# Patient Record
Sex: Female | Born: 1985 | Race: White | Hispanic: No | Smoking: Never smoker
Health system: Southern US, Community
[De-identification: ages and names within clinical notes are randomized; demographics above are authoritative.]

## PROBLEM LIST (undated history)

## (undated) DIAGNOSIS — J189 Pneumonia, unspecified organism: Secondary | ICD-10-CM

## (undated) DIAGNOSIS — R600 Localized edema: Secondary | ICD-10-CM

## (undated) DIAGNOSIS — R51 Headache: Secondary | ICD-10-CM

## (undated) DIAGNOSIS — N390 Urinary tract infection, site not specified: Secondary | ICD-10-CM

## (undated) DIAGNOSIS — F329 Major depressive disorder, single episode, unspecified: Secondary | ICD-10-CM

## (undated) DIAGNOSIS — G4730 Sleep apnea, unspecified: Secondary | ICD-10-CM

## (undated) DIAGNOSIS — F419 Anxiety disorder, unspecified: Secondary | ICD-10-CM

## (undated) DIAGNOSIS — F32A Depression, unspecified: Secondary | ICD-10-CM

## (undated) DIAGNOSIS — I639 Cerebral infarction, unspecified: Secondary | ICD-10-CM

## (undated) DIAGNOSIS — G8250 Quadriplegia, unspecified: Secondary | ICD-10-CM

## (undated) HISTORY — PX: ESOPHAGOGASTRODUODENOSCOPY: SHX1529

## (undated) HISTORY — PX: REMOVAL OF GASTROSTOMY TUBE: SHX6058

## (undated) HISTORY — PX: OTHER SURGICAL HISTORY: SHX169

## (undated) HISTORY — PX: BLADDER SURGERY: SHX569

## (undated) HISTORY — PX: SPINE SURGERY: SHX786

## (undated) HISTORY — DX: Localized edema: R60.0

## (undated) HISTORY — DX: Urinary tract infection, site not specified: N39.0

---
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
## 2006-05-09 ENCOUNTER — Encounter: Admission: RE | Admit: 2006-05-09 | Discharge: 2006-05-09 | Payer: Self-pay | Admitting: Family Medicine

## 2006-11-07 ENCOUNTER — Ambulatory Visit (HOSPITAL_COMMUNITY): Admission: RE | Admit: 2006-11-07 | Discharge: 2006-11-07 | Payer: Self-pay | Admitting: Urology

## 2006-11-20 HISTORY — PX: WISDOM TOOTH EXTRACTION: SHX21

## 2007-07-26 ENCOUNTER — Ambulatory Visit (HOSPITAL_COMMUNITY): Admission: RE | Admit: 2007-07-26 | Discharge: 2007-07-26 | Payer: Self-pay | Admitting: Oral Surgery

## 2007-08-13 ENCOUNTER — Ambulatory Visit (HOSPITAL_COMMUNITY): Admission: RE | Admit: 2007-08-13 | Discharge: 2007-08-13 | Payer: Self-pay | Admitting: Unknown Physician Specialty

## 2010-11-20 HISTORY — PX: TENDON REPAIR: SHX5111

## 2011-04-04 NOTE — Op Note (Signed)
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   SURGICAL PROCEDURE:  Surgical removal of 4 impacted wisdom teeth #1,  #16, #17, #32.   ANESTHESIA:  General.   SURGEON:  Hinton Dyer, D.D.S.   ASSISTANTS:  1. Ray Montez Morita.  2. Montel Culver.   ESTIMATED BLOOD LOSS:  30 mL.   CONDITION AT END OF SURGERY:  Good.   Patient is a quadriplegic and was brought to the operating room in a  supine position which remained throughout the whole procedure.  Her  trach was replaced with an endotracheal tube through the trach hole.  Once asleep, an IV was started on the left foot.  She was then draped  and prepped in the usual fashion for an intraoral procedure.  The throat  was suctioned out and a moist vaginal packing was placed around the  throat to block off the posterior opening.  Four Carpules of 2%  Xylocaine with 1:100,000 epinephrine was given as bilateral blocks and  bilateral infiltration to the maxilla and palate.  Four Carpules of 0.5%  Marcaine was then infiltrated a bilateral blocks and infiltration to the  maxilla and palate.  A child size bite block was used.  A 15 blade was  then used to make and incision over the left tuberosity and a periosteal  elevator reflected a full thickness mucoperiosteal flap.  Occlusal  buccal and distal bone was removed with a rongeur and the tooth was now  exposed.  Tooth #16 was then elevated with an 11A elevator and removed  from the  socket.  The socket was curetted and closed with a 3-0 chromic  suture.  A 15 blade made an incision over the left retromolar pad with a  release at the distal buccal aspect of tooth #18.  A full thickness  mucoperiosteal flap was elevated with a periosteal elevator.  Occlusal  buccal and distal bone was removed with a round bur and copious  irrigation.  The tooth was then suctioned longitudinally and each part  was then removed with an 11A elevator.  The socket was curetted,  irrigated and closed with a 3-0 chromic suture.  A 15 blade was used to  make an incision over the right retromolar pad with a release at the  distal buccal aspect of tooth #31.  A full thickness mucoperiosteal flap  was elevated.  Occlusal buccal and distal bone was removed with a round  bur and copious irrigation.  The tooth was then sectioned longitudinally  with the round bur and copious irrigation.  The tooth was split and  removed with an 11A elevator.  Once both parts were removed, the socket  was curetted, irrigated an closed with a 3-0 chromic suture.  A 15 blade  made an incision over the right tuberosity and a full thickness  mucoperiosteal flap was elevated with the periosteal elevator.  Occlusal  buccal and distal bone was removed with a rongeur.  The tooth was  visualized and elevated out of the socket with an 11A elevator.  The  socket was curetted and closed with a 3-0 chromic suture.  The throat  pack was removed and the throat was suctioned out.  The surgical areas  were packed off.  The bite block was removed and the patient's  endotracheal tube was removed and replaced with her normal trach.  She  tolerated the procedure well and was returned to the recovery room in  good condition.  She will be sent home later this morning and she was  given an RX for Lortab elixir times 300 mL, 1 or 2 teaspoons q.4 h.  p.r.n. pain.           ______________________________  Hinton Dyer, D.D.S.      JLM/MEDQ  D:  08/13/2007  T:  08/13/2007  Job:  [id number]

## 2011-04-07 NOTE — H&P (Signed)
Veronica Huff, Veronica Huff              ACCOUNT NO.:  [id number]   MEDICAL RECORD NO.:  [id number]          PATIENT TYPE:  INP   LOCATION:  0155                         FACILITY:  [facility]   PHYSICIAN:  Ronald L. Earlene Plater, M.D.  DATE OF BIRTH:  09-24-1986   DATE OF ADMISSION:  04/21/2005  DATE OF DISCHARGE:                                HISTORY & PHYSICAL   CHIEF COMPLAINT:  Chronic neurogenic bladder.   HISTORY OF PRESENT ILLNESS:  Veronica Huff is a 25 year old female with past  medical history significant for quadriplegia and resultant neurogenic  bladder that was noted on evaluation to produce high pressures secondary to  decreased compliance. Veronica Huff manages her bladder with intermittent  catheterization and her kidneys were noted to be within normal limits on  renal ultrasound. 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 Due  to the chronic nature for her condition several options have been discussed  with the family for management of her bladder including suprapubic tube,  Foley catheters, ileal conduit, urinary diversion, and augmentation  cystoplasty. After a long discussion with family concerning these options,  they expressed their interest in augmentation cystoplasty with a Mitrofanoff  catheterizable stoma. Due to the fact that there would still be some urine  leakage, the decision was also made to perform a bladder neck closure at the  time of the cystoplasty. All risks, benefits, and alternatives were  discussed with the family in detail and they have voided their  understanding.   REVIEW OF SYSTEMS:  Negative except as above.   PAST MEDICAL HISTORY:  Significant for:  1.  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  2.  Quadriplegia.  3.  Ventilator dependence.  4.  History of aspiration pneumonia.  5.  History of pericardial effusion.  6.  History of hemorrhagic gastritis.   PAST SURGICAL HISTORY:  1.  Status post placement of tracheostomy tube.  2.  Status post placement of  G-tube.   MEDICATIONS:  Baclofen, nortriptyline, Ditropan, multivitamin.   ALLERGIES:  1.  TOBRAMYCIN.  2.  CIPRO.  3.  ERYTHROMYCIN.  4.  ZYRTEC.  5.  TRAZODONE.   SOCIAL HISTORY:  The patient denies smoking or drinking.   FAMILY HISTORY:  Negative for GU malignancy.   PHYSICAL EXAMINATION:  VITAL SIGNS:  The patient is afebrile and  hemodynamically stable.  GENERAL:  The patient is well-nourished white female on a respirator via a  tracheostomy. She is in no acute distress.  HEENT:  Pupils equal, round, reactive to light. Extraocular movements  intact.  NECK:  No masses.  HEART:  Regular rate and rhythm without murmurs.  LUNGS:  Clear to auscultation bilaterally.  ABDOMEN:  Soft, nontender, nondistended. Bowel sounds positive.  EXTREMITIES:  No clubbing, cyanosis or edema, although there is significant  atrophy of musculature on the lower extremities and the upper extremities.  SKIN:  No lesions.  NEUROLOGIC:  The patient is quadriplegic.   ASSESSMENT AND PLAN:  A 25 year old female quadriplegic with neurogenic  bladder. We plan to admit Veronica Huff to [facility] and take  her to the operating room at  which time she will undergo augmentation  cystoplasty with creation of a Mitrofanoff catheterizable stoma. At the same  time, we will plan to do bladder neck closure. Following surgery, she will  transfer to the post anesthesia care unit, and subsequently to the intensive  care unit where further management decisions will be made based on her  condition. We will also plan to contact Dr. Fannie Knee, her pulmonologist,  at the time of admission for assistance in her management following surgery.       JP/MEDQ  D:  04/21/2005  T:  04/21/2005  Job:  [id number]

## 2011-04-07 NOTE — Assessment & Plan Note (Signed)
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  We botoxed her bilateral gastrocs and soleus at last visit with 200  units in each leg.  The patient had good results but has noticed over the  last few weeks some spasms returning and affecting her mobility in her  wheelchair and often when her legs kick into spasms, her knees hit the lap  board and affect the ability to maneuver.  She has seen ENT specialist in  [place], [place], who has placed her on Passey-Muir valve and  actually did some laser surgery on her trachea and has cleared an opening  above the stoma and the patient is currently using a Passey-Muir valve and  is doing some phonation.  She is going to begin in speech therapy shortly.  Sylvester has questions about some recurrent seborrhea.  It does not seem to be  getting better on a routine basis.  Also, there are questions about moving  to a suprapubic catheter to help with independence and avoid intermittent  catheterizations.  She is getting catheterized currently twice a day and her  first catheterization is running 200 or 300 mL and then second  catheterization will range from 400 to 1000.  She does have some  incontinence as well.  She saw Dr. Fannie Knee for breathing issues and she  is trying some nighttime weaning on her own and spent most of the other  night off of the vent, however, the following night she needed to return to  the vent on pressure support to help her get through.   The patient denies any new problems with pain.  Upper extremity spasticity  is generally controlled.  She denies any chest pain or new shortness of  breath or wheezing.  No seizures, weakness or numbness.  No blurred vision.  Mood has been fairly good.  No nausea, vomiting, diarrhea, or constipation.  Denies any new urinary symptoms.   PHYSICAL EXAMINATION:  GENERAL APPEARANCE:  The patient is pleasant and in  no acute distress.  VITAL  SIGNS:  Blood pressure is stable and she is afebrile.  NEUROLOGIC:  The patient is unchanged from the standpoint of her  neurological examination with some trace shoulder shrug and trace to 2/4  tone in the upper extremities.  Upper extremity tightness is mostly due to  contracture.  Lower extremity tone is quite a problem still and she has  multiple areas of sustained clonus noted.  The sensory examination is  unchanged.  The patient is speaking with better articulation and higher  volume whisper with her Passey-Muir valve in place.  She is much more  intelligible than she has been in the past.  Mood is appropriate.  Cognitively she is intact.   ASSESSMENT:  1. 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  2. 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     She is trying to wean herself off the vent slowly at nighttime per     pulmonary recommendations.  3. Urinary retention and incontinence.  4. Seborrheic dermatitis.   PLAN:  1. Instead of returning to Botox injections, I would like to start a     systemic oral agent.  Will begin her on a trial of Zanaflex 2 mg q.h.s.     titrating up after two weeks to 2 mg t.i.d.  Will check a complete     metabolic panel today.  We discussed the potential side effects today.  2. The patient will follow up with ENT regarding her trach and continue with     her speech therapy.  3. Wrote a prescription today for replacement gurney pad and evaluation for     potential new wheelchair since she has had the current one approximately     five years.  4. Will make a referral to Dr. Darvin Neighbours for discussion of suprapubic     catheter and to follow up on any type of urodynamic testing she may need     at this point.  5. I made a dermatology referral as well today.  6. Will see the patient back in approximately four to five weeks' time.      Ranelle Oyster, M.D.   ZTS/MedQ  D:  12/23/2003 10:34:04  T:   12/23/2003 12:14:37  Job #:  [id number]   cc:   Windy Fast L. Ovidio Hanger, M.D.  [address]  [place]  [place] [address]  Fax: [phone]

## 2011-04-07 NOTE — Op Note (Signed)
NAMEEARLY, ORD              ACCOUNT NO.:  [id number]   MEDICAL RECORD NO.:  [id number]          PATIENT TYPE:  INP   LOCATION:  0155                         [facility]ACILITY:  [facility]   PHYSICIAN:  Renato Battles, M.D.     DATE OF BIRTH:  [birth year]   DATE OF PROCEDURE:  04/22/2005  DATE OF DISCHARGE:                                 OPERATIVE REPORT   PRIMARY CARE PHYSICIAN:  Talmadge Coventry, M.D.   REQUESTING PHYSICIAN:  Lucrezia Starch. Earlene Plater, M.D.   PRIMARY PULMONOLOGIST:  Rennis Chris. Maple Hudson, M.D.   REASON FOR CONSULTATION:  General medical management.   HISTORY OF PRESENT ILLNESS:  The patient is a very pleasant, 25 year old  female who had suffered ischemic brainstem stroke back at the age of 67  secondary to unknown reasons.  Since then, the patient has been dealing with  spastic quadriplegia and multiple other medical problems related to her  quadriplegia including neurogenic bladder and chronic respiratory failure.  The patient recently was evaluated for bladder augmentation surgery to  reduce her frequency of in-and-out catheterization, and she was judged to be  a good candidate for the surgery by Dr. Earlene Plater and Dr. Maple Hudson.  She had her  surgery today, and at this time, she is in the unit postoperatively.  The  patient is alert, reports no complaints.  She is interactive but without  verbalizations.  Her parents are at the bedside.  Most of the history was  obtained from the parents.   REVIEW OF SYSTEMS:  Unobtainable.   PAST MEDICAL HISTORY:  1.  Ischemic stroke.  2.  Neurogenic bladder.  3.  History of recurrent UTIs.  4.  History of aspiration pneumonia.  5.  Scoliosis, status post corrective surgery.  6.  Chronic respiratory insufficiency, dependent on pressure support      ventilation at night and tracheostomy collar in the daytime.  7.  History of hemorrhagic gastritis.   PAST SURGICAL HISTORY:  1.  Botox injections in lower extremities for spastic contractures.  2.   Spinal fusion for scoliosis.  3.  Tracheostomy with laser tracheoplasty secondary to tracheal narrowing.   SOCIAL HISTORY:  She does not smoke, drink, or use drugs.  She eats solid  food at home.  She sits in a wheelchair all day during the daytime and uses  a tracheostomy collar at night.  She is on pressure support ventilation  using room air.  She has been able to graduate from high school.   FAMILY HISTORY:  Positive for coronary artery disease.   ALLERGIES:  CIPROFLOXACIN, ERYTHROMYCIN, TOBRAMYCIN, TRAZODONE, AND ZYRTEC.   HOME MEDICATIONS:  1.  Baclofen.  2.  Daily multivitamins.  3.  Colace.  4.  Ditropan XL.   CURRENT MEDICATIONS IN THE HOSPITAL:  1.  Baclofen 20 mg p.o. q.i.d.  2.  Dulcolax suppository.  3.  Dilaudid PCA.  4.  Imipramine 75 mg p.o. q.h.s.  5.  Zosyn 3.375 g IV q.6 h.  6.  Zoloft 100 mg p.o. daily.  7.  Benadryl, Reglan, Narcan, Zofran all p.r.n.   PHYSICAL EXAMINATION:  GENERAL:  The patient is alert, in no acute distress.  VITAL SIGNS:  Temperature 97.7, heart rate in the 130s, respiratory rate 22,  blood pressure at the time of exam 75/35.  HEENT:  The head is atraumatic and normocephalic.  Pupils are equal, round,  and reactive to light and accommodation.  Extraocular movements are intact  bilaterally.  NECK:  No lymphadenopathy, no thyromegaly, no JVD.  CHEST:  Clear to auscultation bilaterally, no wheezing, rales, or rhonchi.  HEART:  Regular rhythm tachycardia, no murmurs.  ABDOMEN:  Soft, nontender, nondistended, absent bowel sounds.  EXTREMITIES:  No cyanosis, edema, or clubbing.  The patient has  hyperextension of all extremities.   STUDIES:  Preoperative CBC showed white count 11.9, hemoglobin 11, platelets  134,000.  Liver functions showed low protein at 4, low albumin at 2.2,  normal functions otherwise.  Electrolytes were all within normal except for  elevated glucose of 130.   ASSESSMENT AND PLAN:  1.  Respiratory:  Dr. Jetty Duhamel is managing her ventilation, and I will      leave the management of the respiratory system to him as he is more      familiar with the patient.  2.  Cardiovascular:  Going to continue SCDs for DVT prophylaxis.  At this      time, the patient is showing hypotension and tachycardia suggestive of      hypovolemia.  I am going to bolus her with IV fluids and normal saline      and monitor her blood pressure and heart rate closely.  Also, I am going      to obtain a CBC to check for any decline in hemoglobin as a sign of      bleeding.  3.  Infectious Disease:  No signs of infection at this time.  Leukocytosis      can be attributed to surgical distress.  However, Zosyn shall be      continued, and I am going to obtain blood culture at this time.  4.  Gastrointestinal:  I am going to give the patient Protonix p.o. for GI      prophylaxis.  Otherwise, the patient should be n.p.o. except for ice      chips until we have bowel sounds or bowel movements.  5.  Renal:  At this time, the patient has a normal BUN and creatinine, good      urine input, no signs of trouble with this system  6.  General:  The patient will be supported out of bed to a bedside chair in      the morning.  Also, physical therapy      will be obtained for range of motion.  Also, we are going to obtain      medical records from Dr. Stephannie Peters office in the morning.   I appreciate the chance of taking care of this patient, and thank you.      SA/MEDQ  D:  04/21/2005  T:  04/22/2005  Job:  [id number]   cc:   Talmadge Coventry, M.D.  [address]  [place]  [place] [address]  Fax: [phone]   Lucrezia Starch. Earlene Plater, M.D.  [address]  [place]  [place] [address]  Fax: [phone]   Clinton D. Maple Hudson, M.D.

## 2011-04-07 NOTE — Op Note (Signed)
NAMECHANNELLE, Veronica Huff              ACCOUNT NO.:  [id number]   MEDICAL RECORD NO.:  [id number]          PATIENT TYPE:  INP   LOCATION:  0002                         FACILITY:  [facility]   PHYSICIAN:  Lucrezia Starch. Earlene Plater, M.D.  DATE OF BIRTH:  [birth year]   DATE OF PROCEDURE:  04/21/2005  DATE OF DISCHARGE:                                 OPERATIVE REPORT   >  Neurogenic bladder, status post brainstem stroke.   OPERATIVE PROCEDURE:  1.  Exploratory laparotomy with dissection of bladder neck, enclosure of      bladder neck.  2.  Goodwin cup augmentation cystoplasty utilizing ileum.  3.  Ileoileostomy.  4.  Mitrofanoff procedure utilizing appendix.  5.  Placement of cystostomy tube.   SURGEON:  Gaynelle Arabian, M.D.   ASSISTANTS:  Willow Ora, MD  Bailey Mech, MD  Cammy Copa, [facility]   INDICATIONS FOR PROCEDURE:  Veronica Huff is a lovely 25 year old white  female, history of brainstem stroke with neurogenic bladder.  She has been  incontinent.  They are having to catheterize her quite often.  On  urodynamics, she has a small capacity bladder.  It was elected to proceed  with the above procedure to increase her reservoir and to make a  catheterizable stoma.  After understanding risks. benefits, and  alternatives, they elected to proceed.   PROCEDURE IN DETAIL:  The patient was placed in supine position.  After  proper general anesthesia utilizing her tracheostomy tube, she was placed in  the frogleg position and prepped and draped with Betadine in sterile  fashion.  A 16 French full silicone catheter was placed into the bladder,  and the bladder was drained and irrigated with antibiotic solution.  A lower  midline vertical abdominal incision was made.  Sharp dissection was carried  down through the subcutaneous tissue.  The peritoneum was entered and  exploratory laparotomy was performed.  The uterus, ovaries, etc., appeared  to be in excellent condition, and no significant  abnormalities were noted.  There was some distention of her transverse and right  colon.  The appendix  was noted to be present.  Dissection was carried in the space of Retzius.  The bladder neck was dissected free from the vaginal mucosa, avoiding the  vaginal mucosa, and I cut at the pelvic diaphragm.  The stump of urethra was  closed with a running 3-0 Vicryl suture, and good hemostasis was noted to be  present.  The bladder neck was closed in two layers utilizing 2-0 Vicryl  sutures, and the procedure was carried above.  An approximately 25 cm  segment of ileum was obtained, maintaining the ileocecal valve intact to  utilize for the Goodwin cup and maintain its vascular pedicle.  The  mesentery was taken down with a LigaSure.  The augmented segment was dropped  inferiorly, and an ileoileostomy was performed utilizing a side-to-side  anastomosis with TIA staples and carried by a TA55.  Wide anastomosis was  noted to be present.  The mesenteric window was closed with interrupted pop-  off 3-0 silk sutures.  The pouch was then sewn vertically  and horizontally  to create a pouch utilizing 3-0 PDS sutures, and the bladder was clam-  shelled in the vertical position and drained.  The Goodwin cup was then  attached to the bladder with running 2-0 PDS sutures.  Before it was  completely closed, a 24 [place] cystostomy tube was placed through a separate  stab incision on the left side of the bladder, inflated with 15 mL, and the  balloon noted to be in good position.  The appendix was then taken down from  the cecum utilizing a TIA device.  It was taken down utilizing a stump of  cecum, and the cecum was oversewn with a running 3-0 silk suture.  The end  of the appendix was resected.  The appendix was irrigated, maintained on its  vascular pedicle, and the distal end was anastomosed into the bladder with 4-  0 PDS sutures and noted to be in good position.  An 70 French full silicone  Foley  catheter was passed and noted to be within the bladder, again with 10  mL inflated in the balloon.  The remaining augmentation cystoplasty was  attached to the bladder.  A stoma was created in the right lower quadrant,  and the proximal appendiceal stump was pulled through and sewn to the skin  with interrupted 4-0 Vicryl suture.  The bladder was then inflated with the  cystostomy tube.  There was no significant leakage noted, and it was noted  to irrigate clear.  The appendiceal stump appeared to be well-vascularized,  and we were comfortable with the stoma.  A large flat Blake drain was placed  through a left stab incision and sutured in place with silk sutures; silk  was placed also to suture in the cystostomy tube.  The catheterizable stoma  tube which was 30 [place] was again full silicone, was capped and placed  within a bag.  Thorough irrigation was performed.  Good hemostasis was noted  to be present.  Omentum was placed down to the pelvis.  The fascia was  closed with running #1 PDS suture with interspaced interrupted figure-of-  eights.  Thorough irrigation was performed in the subcutaneous tissue.  The  skin was closed with skin staples, and the patient was taken to the recovery  room stable.       RLD/MEDQ  D:  04/21/2005  T:  04/21/2005  Job:  [id number]

## 2011-04-07 NOTE — Discharge Summary (Signed)
NAMEELLAMARIE, NAEVE              ACCOUNT NO.:  [id number]   MEDICAL RECORD NO.:  [id number]          PATIENT TYPE:  INP   LOCATION:  0155                         FACILITY:  [facility]   PHYSICIAN:  Ronald L. Earlene Plater, M.D.  DATE OF BIRTH:  July 30, 1986   DATE OF ADMISSION:  04/21/2005  DATE OF DISCHARGE:  04/28/2005                                 DISCHARGE SUMMARY   DIAGNOSES:  Neurogenic bladder.   OPERATIVE PROCEDURE:  Exploratory laparotomy with Goodwin cup augmentation  cystoplasty, appendiceal Mitrofanoff procedure, and closure of bladder neck  on April 21, 2005.   HISTORY AND PHYSICAL EXAMINATION:  Ms. 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  She is managed with intermittent catheterization.  Kidneys  were normal.  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  She  understands risks, benefits, and alternatives on that.   Past medical history, social history, family history, review of systems:  Please see history and physical examination for full detail.   PHYSICAL EXAMINATION:  VITAL SIGNS:  She is afebrile.  Vital signs are  stable.  GENERAL:  Well-nourished, well-developed female.  Has a prior tracheostomy.  Has a tracheostomy in place and has quadriplegia.  HEENT:  Normal.  NECK:  No masses, thyromegaly.  CHEST:  Clear anterior, posterior without rales or rhonchi.  HEART:  Normal sinus rhythm without rubs or gallops.  ABDOMEN:  Soft, nontender.  Bowel sounds are positive.  EXTREMITIES:  She had significant atrophy of the musculature of the lower  and upper extremities.  SKIN:  No lesions.  NEUROLOGIC:  As above.   HOSPITAL COURSE:  Patient was admitted.  After undergoing proper  preoperative evaluation was subsequently taken to surgery and  underwent the  above procedure.  Immediately postoperative her hemoglobin was 11,  hematocrit 31.8.  BMET was essentially normal.  Blake output was low.  She  was seen by Dr. Fannie Knee, her pulmonologist, in consultation and you can  see his note for full details.  He helped manage her breathing status.  We  also asked her to be seen by the hospitalists who helped manage the patient  throughout the course.  By June 3 which was postoperative day one she was  afebrile.  Hemoglobin was 9.4.  She was somewhat hypovolemic and was  rehydrated.  She was utilizing her ventilator at night, but not during the  day.  She also had some acute delirium from her narcotics and these were  tapered.  By postoperative day two, April 23, 2005, again she was stable  hemodynamically.  Blood pressure was slightly low.  Hemoglobin was 8.4 and  white blood cell count was 10.7.  Her abdomen was soft.  All wounds were  clean.  Drainage was quite low from her Harrison Mons drain but again, she was  suffering some from acute delirium which was managed appropriately.  By April 24, 2005 she was much  more alert.  She was noted to have some hypotension  that was currently stable.  She had hypokalemia which was managed with runs  of potassium.  Continued to do well with that.  She was somewhat anemic, but  again, hemoglobin was stable at 9.7 and all wounds appeared to be healing  well.  Mobilization was begun at that point.  Physical therapy was also  asked to see the patient and she underwent respiratory care.  She was noted  to be on a Jenn-Air bed and skin was protected.  By April 25, 2005 the  hypotension was resolved.  Hypokalemia was still being managed.  Potassium  was 3.2.  She was being given runs of potassium.  She would develop diarrhea  at that point.  It was felt that the respiratory status was stable.  Thrombocytopenia was resolved.  Hallucinations and acute delirium was  resolved.  Mobilization was continued to be  increase and her pulmonary  toilet was continued to be maintained.  By April 26, 2005 diarrhea was much  better.  The Zosyn was discontinued and her situation continued to improve  along with her pulmonary management.  We continued to irrigate her bladder.  Blake drain had minimal drainage.  By April 27, 2005 she was doing well.  Vital signs were stable.  Catheter teaching was begun.  The [place] drain was  draining very little, therefore, it was removed.  By June 9 situation  continued to improve.  She was subsequently discharged.   DISCHARGE INSTRUCTIONS:  Home care given.  Bladder management.   CONDITION ON DISCHARGE:  Improved.  She was to follow up the following week  to see Dr. Retta Diones.  Wounds were clear.  She will come for staple removal.  Discharge medications were marked in the chart.       RLD/MEDQ  D:  05/10/2005  T:  05/10/2005  Job:  [id number]

## 2011-08-31 LAB — CBC
HCT: 36.6
MCV: 73.5 — ABNORMAL LOW

## 2011-09-01 LAB — BASIC METABOLIC PANEL
BUN: 8
GFR calc Af Amer: >60
GFR calc non Af Amer: >60
Potassium: 3.9
Sodium: 138

## 2011-09-01 LAB — URINALYSIS, ROUTINE W REFLEX MICROSCOPIC
Glucose, UA: NEGATIVE
Ketones, ur: NEGATIVE
Nitrite: NEGATIVE

## 2011-09-01 LAB — CBC
HCT: 35.2 — ABNORMAL LOW
Hemoglobin: 11.5 — ABNORMAL LOW
MCHC: 32.7
RBC: 4.72
RDW: 16.5 — ABNORMAL HIGH
WBC: 6

## 2011-09-01 LAB — URINE MICROSCOPIC-ADD ON

## 2012-04-09 ENCOUNTER — Encounter (HOSPITAL_COMMUNITY): Payer: Self-pay | Admitting: Emergency Medicine

## 2012-04-09 ENCOUNTER — Emergency Department (HOSPITAL_COMMUNITY)
Admission: EM | Admit: 2012-04-09 | Discharge: 2012-04-10 | Disposition: A | Discharge status: Home / Self Care | Payer: Medicaid Other | Attending: Emergency Medicine | Admitting: Emergency Medicine

## 2012-04-09 DIAGNOSIS — M549 Dorsalgia, unspecified: Secondary | ICD-10-CM | POA: Insufficient documentation

## 2012-04-09 DIAGNOSIS — G4730 Sleep apnea, unspecified: Secondary | ICD-10-CM | POA: Insufficient documentation

## 2012-04-09 DIAGNOSIS — Z8679 Personal history of other diseases of the circulatory system: Secondary | ICD-10-CM | POA: Insufficient documentation

## 2012-04-09 DIAGNOSIS — Z87440 Personal history of urinary (tract) infections: Secondary | ICD-10-CM | POA: Insufficient documentation

## 2012-04-09 DIAGNOSIS — M542 Cervicalgia: Secondary | ICD-10-CM | POA: Insufficient documentation

## 2012-04-09 DIAGNOSIS — R300 Dysuria: Secondary | ICD-10-CM | POA: Insufficient documentation

## 2012-04-09 DIAGNOSIS — G8250 Quadriplegia, unspecified: Secondary | ICD-10-CM | POA: Insufficient documentation

## 2012-04-09 DIAGNOSIS — R3915 Urgency of urination: Secondary | ICD-10-CM | POA: Insufficient documentation

## 2012-04-09 DIAGNOSIS — N390 Urinary tract infection, site not specified: Secondary | ICD-10-CM

## 2012-04-09 HISTORY — DX: Quadriplegia, unspecified: G82.50

## 2012-04-09 HISTORY — DX: Cerebral infarction, unspecified: I63.9

## 2012-04-09 HISTORY — DX: Sleep apnea, unspecified: G47.30

## 2012-04-09 LAB — URINALYSIS, ROUTINE W REFLEX MICROSCOPIC
Glucose, UA: NEGATIVE mg/dL
Ketones, ur: NEGATIVE mg/dL
Protein, ur: 30 mg/dL — AB
Urobilinogen, UA: 0.2 mg/dL (ref 0.0–1.0)

## 2012-04-09 LAB — URINE MICROSCOPIC-ADD ON

## 2012-04-09 MED ORDER — SODIUM CHLORIDE 0.9 % IV SOLN
INTRAVENOUS | Status: DC
  Administered 2012-04-09: via INTRAVENOUS

## 2012-04-09 MED ORDER — HYDROMORPHONE HCL PF 1 MG/ML IJ SOLN
1.0000 mg | Freq: Once | INTRAMUSCULAR | Status: AC
  Administered 2012-04-09: 1 mg via INTRAVENOUS
  Filled 2012-04-09: qty 1

## 2012-04-09 MED ORDER — ONDANSETRON HCL 4 MG/2ML IJ SOLN
4.0000 mg | Freq: Once | INTRAMUSCULAR | Status: AC
  Administered 2012-04-09: 4 mg via INTRAVENOUS
  Filled 2012-04-09: qty 2

## 2012-04-09 NOTE — ED Notes (Signed)
Pt states that she has had groin pain x 2 days. Feels like UTI. States she has many. Pt is quadraplegic since 2000 bc of CVA.

## 2012-04-09 NOTE — ED Notes (Signed)
ZOX:[id number]<AV> Expected date:04/09/12<BR> Expected time: 8:21 PM<BR> Means of arrival:Ambulance<BR> Comments:<BR> Pt36. 25 f. Wheelchair bound, quad pt. UTI symptoms, pain. 15 mins

## 2012-04-10 MED ORDER — OXYCODONE-ACETAMINOPHEN 5-325 MG PO TABS: 1.0000 | ORAL_TABLET | ORAL | Status: AC | PRN

## 2012-04-10 MED ORDER — HYDROMORPHONE HCL PF 1 MG/ML IJ SOLN
1.0000 mg | Freq: Once | INTRAMUSCULAR | Status: AC
  Administered 2012-04-10: 1 mg via INTRAVENOUS
  Filled 2012-04-10: qty 1

## 2012-04-10 MED ORDER — CEPHALEXIN 500 MG PO CAPS: 500.0000 mg | ORAL_CAPSULE | Freq: Four times a day (QID) | ORAL | Status: AC

## 2012-04-10 MED ORDER — DEXTROSE 5 % IV SOLN
1.0000 g | Freq: Once | INTRAVENOUS | Status: AC
  Administered 2012-04-10: 1 g via INTRAVENOUS
  Filled 2012-04-10: qty 10

## 2012-04-10 NOTE — Discharge Instructions (Signed)
Drink plenty of fluids. Start the antibiotic prescription tomorrow.   Urinary Tract Infection Infections of the urinary tract can start in several places. A bladder infection (cystitis), a kidney infection (pyelonephritis), and a prostate infection (prostatitis) are different types of urinary tract infections (UTIs). They usually get better if treated with medicines (antibiotics) that kill germs. Take all the medicine until it is gone. You or your child may feel better in a few days, but TAKE ALL MEDICINE or the infection may not respond and may become more difficult to treat. HOME CARE INSTRUCTIONS   Drink enough water and fluids to keep the urine clear or pale yellow. Cranberry juice is especially recommended, in addition to large amounts of water.   Avoid caffeine, tea, and carbonated beverages. They tend to irritate the bladder.   Alcohol may irritate the prostate.   Only take over-the-counter or prescription medicines for pain, discomfort, or fever as directed by your caregiver.  To prevent further infections:  Empty the bladder often. Avoid holding urine for long periods of time.   After a bowel movement, women should cleanse from front to back. Use each tissue only once.   Empty the bladder before and after sexual intercourse.  FINDING OUT THE RESULTS OF YOUR TEST Not all test results are available during your visit. If your or your child's test results are not back during the visit, make an appointment with your caregiver to find out the results. Do not assume everything is normal if you have not heard from your caregiver or the medical facility. It is important for you to follow up on all test results. SEEK MEDICAL CARE IF:   There is back pain.   Your baby is older than 3 months with a rectal temperature of 100.5 F (38.1 C) or higher for more than 1 day.   Your or your child's problems (symptoms) are no better in 3 days. Return sooner if you or your child is getting worse.    SEEK IMMEDIATE MEDICAL CARE IF:   There is severe back pain or lower abdominal pain.   You or your child develops chills.   You have a fever.   Your baby is older than 3 months with a rectal temperature of 102 F (38.9 C) or higher.   Your baby is 57 months old or younger with a rectal temperature of 100.4 F (38 C) or higher.   There is nausea or vomiting.   There is continued burning or discomfort with urination.  MAKE SURE YOU:   Understand these instructions.   Will watch your condition.   Will get help right away if you are not doing well or get worse.  Document Released: 08/16/2005 Document Revised: 10/26/2011 Document Reviewed: 03/21/2007 [facility] Patient Information 2012 [place], [place].

## 2012-04-10 NOTE — ED Provider Notes (Signed)
History     CSN: [id number]  Arrival date & time 04/09/12  2030   First MD Initiated Contact with Patient 04/09/12 2234      Chief Complaint  Patient presents with  . Urinary Tract Infection    (Consider location/radiation/quality/duration/timing/severity/associated sxs/prior treatment) HPI Comments: Veronica Huff is a 26 y.o. Female who has had urgency, and pain. When she has urinary self-catheterization at home for 2 days. She denies fever, chills, nausea or vomiting. She last had urinary tract infection 3 months ago. She has generalized back pain and neck pain, that she feels is related to lying on the gurney. She has been using her regular medication, without relief.  Patient is a 26 y.o. female presenting with urinary tract infection. The history is provided by the patient.  Urinary Tract Infection    Past Medical History  Diagnosis Date  . CVA (cerebral infarction)   . Quadriplegia   . Sleep apnea   . UTI (urinary tract infection)     History reviewed. No pertinent past surgical history.  No family history on file.  History  Substance Use Topics  . Smoking status: Not on file  . Smokeless tobacco: Not on file  . Alcohol Use:     OB History    Grav Para Term Preterm Abortions TAB SAB Ect Mult Living                  Review of Systems  All other systems reviewed and are negative.    Allergies  Ciprofloxacin; Erythromycin; Tobramycin; and Trazodone and nefazodone  Home Medications   Current Outpatient Rx  Name Route Sig Dispense Refill  . BACLOFEN 20 MG PO TABS Oral Take 40 mg by mouth 4 (four) times daily.    Marland Kitchen BISACODYL 10 MG RE SUPP Rectal Place 10 mg rectally every other day as needed. constipation    . CETIRIZINE HCL 10 MG PO TABS Oral Take 10 mg by mouth daily.    . CHLORHEXIDINE GLUCONATE 0.12 % MT SOLN Mouth/Throat Use as directed 15-30 mLs in the mouth or throat 2 (two) times daily. Rinse and spit    . DANTROLENE SODIUM 50 MG PO CAPS Oral  Take 50-100 mg by mouth 3 (three) times daily. Takes 2 capsules(100mg ) every morning, 1 capsule(50mg ) midday and takes 2 capsules(100mg ) every night    . FLUOCINOLONE ACETONIDE 0.01 % EX SHAM Apply externally Apply 1 application topically daily.    Marland Kitchen HYDROCORTISONE 1 % EX LOTN Topical Apply 1 application topically 2 (two) times daily.    . IMIPRAMINE HCL 25 MG PO TABS Oral Take 75-100 mg by mouth at bedtime.    Marland Kitchen MUPIROCIN 2 % EX OINT Tracheal Tube 1 application by Tracheal Tube route daily. Applies to trach-stoma site    . NEOMYCIN-POLYMYXIN B GU IR SOLN GU irrigant 30-40 mLs by GU irrigant route at bedtime.    Marland Kitchen NITROFURANTOIN MACROCRYSTAL 50 MG PO CAPS Oral Take 50 mg by mouth at bedtime.    . NYSTATIN 100000 UNIT/GM EX POWD Topical Apply 1 g topically 2 (two) times daily as needed. redness    . OLOPATADINE HCL 0.1 % OP SOLN Both Eyes Place 1 drop into both eyes 2 (two) times daily.    Marland Kitchen OMEPRAZOLE 40 MG PO CPDR Oral Take 40 mg by mouth daily.    Marland Kitchen POLYETHYLENE GLYCOL 3350 PO PACK Oral Take 17 g by mouth daily.    Marland Kitchen POLYETHYLENE GLYCOL 400 0.25 % OP GEL Ophthalmic  Apply 1-2 drops to eye 2 (two) times daily as needed. Excessive dryness    . SERTRALINE HCL 100 MG PO TABS Oral Take 100 mg by mouth at bedtime.    . SODIUM CHLORIDE 0.9 % IR SOLN GU irrigant 50 mLs by GU irrigant route every 6 (six) hours as needed. Sediment/ lack of patency    . SODIUM FLUORIDE 1.1 % DT CREA dental Place 1 application onto teeth 2 (two) times daily.    Marland Kitchen SOLIFENACIN SUCCINATE 10 MG PO TABS Oral Take 5 mg by mouth daily.    . CEPHALEXIN 500 MG PO CAPS Oral Take 1 capsule (500 mg total) by mouth 4 (four) times daily. 28 capsule 0  . OXYCODONE-ACETAMINOPHEN 5-325 MG PO TABS Oral Take 1 tablet by mouth every 4 (four) hours as needed for pain. 20 tablet 0    BP 104/54  Pulse 99  Temp(Src) 97.7 F (36.5 C) (Oral)  Resp 16  SpO2 100%  Physical Exam  Nursing note and vitals reviewed. Constitutional: She is  oriented to person, place, and time. She appears well-developed and well-nourished.  HENT:  Head: Normocephalic and atraumatic.  Eyes: Conjunctivae and EOM are normal. Pupils are equal, round, and reactive to light.  Neck: Phonation normal. Neck supple.        Patent tracheostomy, site appears, normal.  Cardiovascular: Normal rate and regular rhythm.   Pulmonary/Chest: Effort normal and breath sounds normal. She exhibits no tenderness.  Abdominal: Soft. She exhibits no distension. There is no tenderness. There is no guarding.  Musculoskeletal: She exhibits no edema.  Neurological: She is alert and oriented to person, place, and time. She has normal strength. No cranial nerve deficit.       Quadriparetic with spasticity of arms, and leg  Skin: Skin is warm and dry.  Psychiatric: She has a normal mood and affect. Her behavior is normal. Judgment and thought content normal.    ED Course  Procedures (including critical care time)  Emergency department treatment: IV fluid , IV, Dilaudid, and Zofran, IV, Rocephin   Labs Reviewed  URINALYSIS, ROUTINE W REFLEX MICROSCOPIC - Abnormal; Notable for the following:    Hgb urine dipstick SMALL (*)    Protein, ur 30 (*)    Leukocytes, UA MODERATE (*)    All other components within normal limits  URINE MICROSCOPIC-ADD ON  URINE CULTURE   No results found.   1. UTI (lower urinary tract infection)       MDM  Uncomplicated urinary tract infection. Doubt sepsis, metabolic instability or impending vascular collapse.    Plan: Home Medications- Keflex, Percocet; Home Treatments- increase fluids; Recommended follow up- PCP in 7-10 days for repeat urine        Flint Melter, MD 04/11/12 1535

## 2012-04-14 LAB — URINE CULTURE
Colony Count: >=100000
Culture  Setup Time: 201305220453

## 2012-04-15 NOTE — ED Notes (Signed)
+   urine Patient treated with keflex-sensitive to same-chart appended per protocol MD. 

## 2012-08-19 ENCOUNTER — Other Ambulatory Visit: Payer: Self-pay | Admitting: Urology

## 2012-08-19 DIAGNOSIS — N390 Urinary tract infection, site not specified: Secondary | ICD-10-CM

## 2012-08-19 DIAGNOSIS — N319 Neuromuscular dysfunction of bladder, unspecified: Secondary | ICD-10-CM

## 2012-08-23 ENCOUNTER — Ambulatory Visit
Admission: RE | Admit: 2012-08-23 | Discharge: 2012-08-23 | Disposition: A | Discharge status: Home / Self Care | Payer: Medicaid Other | Source: Ambulatory Visit | Attending: Urology | Admitting: Urology

## 2012-08-23 ENCOUNTER — Other Ambulatory Visit: Payer: Self-pay | Admitting: Urology

## 2012-08-23 DIAGNOSIS — N390 Urinary tract infection, site not specified: Secondary | ICD-10-CM

## 2012-08-23 DIAGNOSIS — R109 Unspecified abdominal pain: Secondary | ICD-10-CM

## 2012-08-23 DIAGNOSIS — N319 Neuromuscular dysfunction of bladder, unspecified: Secondary | ICD-10-CM

## 2012-09-27 ENCOUNTER — Other Ambulatory Visit (HOSPITAL_COMMUNITY): Payer: Self-pay | Admitting: Gastroenterology

## 2012-09-27 DIAGNOSIS — R110 Nausea: Secondary | ICD-10-CM

## 2012-10-11 ENCOUNTER — Encounter (HOSPITAL_COMMUNITY)
Admission: RE | Admit: 2012-10-11 | Discharge: 2012-10-11 | Disposition: A | Discharge status: Home / Self Care | Payer: Medicaid Other | Source: Ambulatory Visit | Attending: Gastroenterology | Admitting: Gastroenterology

## 2012-10-11 ENCOUNTER — Other Ambulatory Visit (HOSPITAL_COMMUNITY): Payer: Self-pay | Admitting: Gastroenterology

## 2012-10-11 DIAGNOSIS — R110 Nausea: Secondary | ICD-10-CM

## 2012-10-11 MED ORDER — TECHNETIUM TC 99M MEBROFENIN IV KIT
5.0000 | PACK | Freq: Once | INTRAVENOUS | Status: AC | PRN
  Administered 2012-10-11: 5 via INTRAVENOUS

## 2012-10-11 MED ORDER — SINCALIDE 5 MCG IJ SOLR
INTRAMUSCULAR | Status: AC
  Filled 2012-10-11: qty 5

## 2012-10-23 ENCOUNTER — Encounter (INDEPENDENT_AMBULATORY_CARE_PROVIDER_SITE_OTHER): Payer: Self-pay | Admitting: General Surgery

## 2012-10-25 ENCOUNTER — Ambulatory Visit (INDEPENDENT_AMBULATORY_CARE_PROVIDER_SITE_OTHER): Payer: Medicaid Other | Admitting: General Surgery

## 2012-10-25 ENCOUNTER — Encounter (INDEPENDENT_AMBULATORY_CARE_PROVIDER_SITE_OTHER): Payer: Self-pay | Admitting: General Surgery

## 2012-10-25 DIAGNOSIS — K828 Other specified diseases of gallbladder: Secondary | ICD-10-CM

## 2012-10-25 NOTE — Progress Notes (Signed)
Patient ID: Veronica Huff, female   DOB: 1986-06-21, 26 y.o.   MRN: [id number]  Chief Complaint  Patient presents with  . Abdominal Pain    HPI Veronica Huff is a 26 y.o. female.  Is referred by Dr. Madilyn Fireman for evaluation for possible biliary dyskinesia versus colic. Patient is a 26 year old female wheelchair-bound after stroke in 2000.  Patient's had bouts of nausea without any emesis over the last 2 years. Patient's undergone ultrasound revealed multiple gallstones. Patient has a HIDA scan which reveals no filling of the gallbladder with a normal ejection fraction. Patient has been worked up by GI with upper endoscopy suite are negative at this time. She's been placed on PPIs without relief. HPI  Past Medical History  Diagnosis Date  . CVA (cerebral infarction)   . Quadriplegia   . Sleep apnea   . UTI (urinary tract infection)     Past Surgical History  Procedure Date  . Ileostomy 2006  . Esophagogastroduodenoscopy   . Spine surgery 2004,2011  . Trachiotomy 2000  . Gastrostomy tube placement 2000  . Bronchosocpy 2006  . Throat surgery 2004  . Wisdom tooth extraction 2008  . Bladder surgery 2009,2012  . Tendon repair left wrist 2012    History reviewed. No pertinent family history.  Social History History  Substance Use Topics  . Smoking status: Never Smoker   . Smokeless tobacco: Not on file  . Alcohol Use: No    Allergies  Allergen Reactions  . Ciprofloxacin   . Erythromycin   . Morphine And Related Other (See Comments)    Goes crazy  . Tobramycin   . Trazodone And Nefazodone     Current Outpatient Prescriptions  Medication Sig Dispense Refill  . baclofen (LIORESAL) 20 MG tablet Take 40 mg by mouth 4 (four) times daily.      . bisacodyl (DULCOLAX) 10 MG suppository Place 10 mg rectally every other day as needed. constipation      . cetirizine (ZYRTEC) 10 MG tablet Take 10 mg by mouth daily.      . chlorhexidine (PERIDEX) 0.12 % solution Use as directed  15-30 mLs in the mouth or throat 2 (two) times daily. Rinse and spit      . dantrolene (DANTRIUM) 50 MG capsule Take 50-100 mg by mouth 3 (three) times daily. Takes 2 capsules(100mg ) every morning, 1 capsule(50mg ) midday and takes 2 capsules(100mg ) every night      . Fluocinolone Acetonide (CAPEX) 0.01 % SHAM Apply 1 application topically daily.      . hydrocortisone 1 % lotion Apply 1 application topically 2 (two) times daily.      Marland Kitchen imipramine (TOFRANIL) 25 MG tablet Take 75-100 mg by mouth at bedtime.      . mupirocin ointment (BACTROBAN) 2 % 1 application by Tracheal Tube route daily. Applies to trach-stoma site      . neomycin-polymyxin B (NEOSPORIN GU IRRIGANT) SOLN 30-40 mLs by GU irrigant route at bedtime.      . nitrofurantoin (MACRODANTIN) 50 MG capsule Take 50 mg by mouth at bedtime.      Marland Kitchen nystatin (MYCOSTATIN/NYSTOP) 100000 UNIT/GM POWD Apply 1 g topically 2 (two) times daily as needed. redness      . olopatadine (PATANOL) 0.1 % ophthalmic solution Place 1 drop into both eyes 2 (two) times daily.      Marland Kitchen omeprazole (PRILOSEC) 40 MG capsule Take 40 mg by mouth daily.      . polyethylene glycol (MIRALAX / GLYCOLAX) packet  Take 17 g by mouth daily.      . Polyethylene Glycol 400 (BLINK TEARS) 0.25 % GEL Apply 1-2 drops to eye 2 (two) times daily as needed. Excessive dryness      . sertraline (ZOLOFT) 100 MG tablet Take 100 mg by mouth at bedtime.      . sodium chloride irrigation 0.9 % irrigation 50 mLs by GU irrigant route every 6 (six) hours as needed. Sediment/ lack of patency      . sodium fluoride (PREVIDENT 5000 PLUS) 1.1 % CREA dental cream Place 1 application onto teeth 2 (two) times daily.      . solifenacin (VESICARE) 10 MG tablet Take 5 mg by mouth daily.        Review of Systems Review of Systems  Constitutional: Negative.   HENT: Negative.   Respiratory: Negative.   Cardiovascular: Negative.   Gastrointestinal: Negative.   Musculoskeletal: Negative.     Last  menstrual period 09/18/2012.  Physical Exam Physical Exam  Constitutional: She is oriented to person, place, and time. She appears well-developed and well-nourished.  HENT:  Head: Normocephalic and atraumatic.  Eyes: Conjunctivae normal and EOM are normal. Pupils are equal, round, and reactive to light.  Neck: Neck supple.  Cardiovascular: Normal rate and normal heart sounds.   Pulmonary/Chest: Effort normal and breath sounds normal.  Abdominal: Soft. There is no tenderness. There is no rebound.  Musculoskeletal: Normal range of motion.  Neurological: She is alert and oriented to person, place, and time.    Data Reviewed reviewed  Assessment    26 year old female with possible biliary colic.    Plan    1. Will proceed to the operating room for laparoscopic cholecystectomy with no cholangiogram. 2. All risks and benefits were discussed with the patient, to generally include infection, bleeding, damage to surrounding structures, and recurrence. Alternatives were offered and described.  All questions were answered and the patient voiced understanding of the procedure and wishes to proceed at this point.        Marigene Ehlers., Natayah Warmack 10/25/2012, 3:21 PM

## 2012-11-07 ENCOUNTER — Encounter (HOSPITAL_COMMUNITY): Payer: Self-pay | Admitting: *Deleted

## 2012-11-07 ENCOUNTER — Other Ambulatory Visit (HOSPITAL_COMMUNITY): Payer: Self-pay | Admitting: *Deleted

## 2012-11-07 NOTE — Progress Notes (Signed)
SAMEDAY PREOP INSTRUCTIONS DISCUSSED WITH PT BY PHONE AND HER BOYFRIEND WROTE DOWN HER INSTRUCTIONS FOR HER.  CHLORHEXIDINE SHOWER INSTRUCTIONS / PRECAUTIONS DISCUSSED WITH PT.

## 2012-11-12 ENCOUNTER — Encounter (HOSPITAL_COMMUNITY): Payer: Self-pay | Admitting: Pharmacy Technician

## 2012-11-18 ENCOUNTER — Ambulatory Visit (HOSPITAL_COMMUNITY): Payer: Medicaid Other | Admitting: Anesthesiology

## 2012-11-18 ENCOUNTER — Encounter (HOSPITAL_COMMUNITY): Payer: Self-pay | Admitting: *Deleted

## 2012-11-18 ENCOUNTER — Encounter (HOSPITAL_COMMUNITY): Payer: Self-pay | Admitting: Anesthesiology

## 2012-11-18 ENCOUNTER — Ambulatory Visit (HOSPITAL_COMMUNITY)
Admission: RE | Admit: 2012-11-18 | Discharge: 2012-11-18 | Disposition: A | Discharge status: Home / Self Care | Payer: Medicaid Other | Source: Ambulatory Visit | Attending: General Surgery | Admitting: General Surgery

## 2012-11-18 ENCOUNTER — Encounter (HOSPITAL_COMMUNITY)
Admission: RE | Disposition: A | Discharge status: Home / Self Care | Payer: Self-pay | Source: Ambulatory Visit | Attending: General Surgery

## 2012-11-18 DIAGNOSIS — K8010 Calculus of gallbladder with chronic cholecystitis without obstruction: Secondary | ICD-10-CM | POA: Insufficient documentation

## 2012-11-18 DIAGNOSIS — G4730 Sleep apnea, unspecified: Secondary | ICD-10-CM | POA: Insufficient documentation

## 2012-11-18 DIAGNOSIS — Z8673 Personal history of transient ischemic attack (TIA), and cerebral infarction without residual deficits: Secondary | ICD-10-CM | POA: Insufficient documentation

## 2012-11-18 HISTORY — DX: Depression, unspecified: F32.A

## 2012-11-18 HISTORY — DX: Cerebral infarction, unspecified: I63.9

## 2012-11-18 HISTORY — PX: CHOLECYSTECTOMY: SHX55

## 2012-11-18 HISTORY — DX: Headache: R51

## 2012-11-18 HISTORY — DX: Major depressive disorder, single episode, unspecified: F32.9

## 2012-11-18 LAB — BASIC METABOLIC PANEL
CO2: 29 mEq/L (ref 19–32)
Chloride: 97 mEq/L (ref 96–112)
Sodium: 134 mEq/L — ABNORMAL LOW (ref 135–145)

## 2012-11-18 LAB — CBC
HCT: 40.1 % (ref 36.0–46.0)
Hemoglobin: 13.8 g/dL (ref 12.0–15.0)
MCV: 83.7 fL (ref 78.0–100.0)
RBC: 4.79 MIL/uL (ref 3.87–5.11)
WBC: 4.6 10*3/uL (ref 4.0–10.5)

## 2012-11-18 LAB — HCG, SERUM, QUALITATIVE: Preg, Serum: NEGATIVE

## 2012-11-18 LAB — SURGICAL PCR SCREEN: Staphylococcus aureus: NEGATIVE

## 2012-11-18 SURGERY — LAPAROSCOPIC CHOLECYSTECTOMY
Anesthesia: General | Wound class: Contaminated

## 2012-11-18 MED ORDER — ONDANSETRON HCL 4 MG/2ML IJ SOLN: 4.0000 mg | Freq: Four times a day (QID) | INTRAMUSCULAR | Status: DC | PRN

## 2012-11-18 MED ORDER — NEOSTIGMINE METHYLSULFATE 1 MG/ML IJ SOLN
INTRAMUSCULAR | Status: DC | PRN
  Administered 2012-11-18: 3 mg via INTRAVENOUS

## 2012-11-18 MED ORDER — FENTANYL CITRATE 0.05 MG/ML IJ SOLN
INTRAMUSCULAR | Status: DC | PRN
  Administered 2012-11-18 (×5): 50 ug via INTRAVENOUS

## 2012-11-18 MED ORDER — KETOROLAC TROMETHAMINE 15 MG/ML IJ SOLN
INTRAMUSCULAR | Status: AC
  Filled 2012-11-18: qty 2

## 2012-11-18 MED ORDER — PROMETHAZINE HCL 25 MG/ML IJ SOLN: 6.2500 mg | INTRAMUSCULAR | Status: DC | PRN

## 2012-11-18 MED ORDER — MUPIROCIN 2 % EX OINT
TOPICAL_OINTMENT | Freq: Once | CUTANEOUS | Status: DC
  Filled 2012-11-18: qty 22

## 2012-11-18 MED ORDER — ACETAMINOPHEN 10 MG/ML IV SOLN
INTRAVENOUS | Status: AC
  Filled 2012-11-18: qty 100

## 2012-11-18 MED ORDER — ACETAMINOPHEN 10 MG/ML IV SOLN
INTRAVENOUS | Status: DC | PRN
  Administered 2012-11-18: 1000 mg via INTRAVENOUS

## 2012-11-18 MED ORDER — CEFAZOLIN SODIUM-DEXTROSE 2-3 GM-% IV SOLR
INTRAVENOUS | Status: AC
  Filled 2012-11-18: qty 50

## 2012-11-18 MED ORDER — CHLORHEXIDINE GLUCONATE 4 % EX LIQD: 1.0000 "application " | Freq: Once | CUTANEOUS | Status: DC

## 2012-11-18 MED ORDER — FENTANYL CITRATE 0.05 MG/ML IJ SOLN
25.0000 ug | INTRAMUSCULAR | Status: DC | PRN
  Administered 2012-11-18 (×4): 25 ug via INTRAVENOUS

## 2012-11-18 MED ORDER — OXYCODONE HCL 5 MG PO TABS
5.0000 mg | ORAL_TABLET | ORAL | Status: DC | PRN
  Administered 2012-11-18: 5 mg via ORAL
  Filled 2012-11-18: qty 1

## 2012-11-18 MED ORDER — BUPIVACAINE-EPINEPHRINE PF 0.25-1:200000 % IJ SOLN
INTRAMUSCULAR | Status: AC
  Filled 2012-11-18: qty 30

## 2012-11-18 MED ORDER — BUPIVACAINE-EPINEPHRINE (PF) 0.5% -1:200000 IJ SOLN
INTRAMUSCULAR | Status: AC
  Filled 2012-11-18: qty 10

## 2012-11-18 MED ORDER — KETOROLAC TROMETHAMINE 30 MG/ML IJ SOLN
15.0000 mg | Freq: Once | INTRAMUSCULAR | Status: AC | PRN
  Administered 2012-11-18: 30 mg via INTRAVENOUS

## 2012-11-18 MED ORDER — LACTATED RINGERS IV SOLN
INTRAVENOUS | Status: DC
  Administered 2012-11-18: 1000 mL via INTRAVENOUS
  Administered 2012-11-18: 11:00:00 via INTRAVENOUS

## 2012-11-18 MED ORDER — BUPIVACAINE-EPINEPHRINE PF 0.25-1:200000 % IJ SOLN
INTRAMUSCULAR | Status: DC | PRN
  Administered 2012-11-18: 15 mL

## 2012-11-18 MED ORDER — SODIUM CHLORIDE 0.9 % IJ SOLN: 3.0000 mL | INTRAMUSCULAR | Status: DC | PRN

## 2012-11-18 MED ORDER — OXYCODONE-ACETAMINOPHEN 5-325 MG PO TABS: 1.0000 | ORAL_TABLET | ORAL | Status: DC | PRN

## 2012-11-18 MED ORDER — CEFAZOLIN SODIUM-DEXTROSE 2-3 GM-% IV SOLR
2.0000 g | INTRAVENOUS | Status: AC
  Administered 2012-11-18: 2 g via INTRAVENOUS

## 2012-11-18 MED ORDER — SODIUM CHLORIDE 0.9 % IJ SOLN: 3.0000 mL | Freq: Two times a day (BID) | INTRAMUSCULAR | Status: DC

## 2012-11-18 MED ORDER — GLYCOPYRROLATE 0.2 MG/ML IJ SOLN
INTRAMUSCULAR | Status: DC | PRN
  Administered 2012-11-18: 0.4 mg via INTRAVENOUS

## 2012-11-18 MED ORDER — ROCURONIUM BROMIDE 100 MG/10ML IV SOLN
INTRAVENOUS | Status: DC | PRN
  Administered 2012-11-18: 30 mg via INTRAVENOUS

## 2012-11-18 MED ORDER — PROPOFOL 10 MG/ML IV BOLUS
INTRAVENOUS | Status: DC | PRN
  Administered 2012-11-18: 70 mg via INTRAVENOUS

## 2012-11-18 MED ORDER — ACETAMINOPHEN 325 MG PO TABS: 650.0000 mg | ORAL_TABLET | ORAL | Status: DC | PRN

## 2012-11-18 MED ORDER — LIDOCAINE HCL (CARDIAC) 20 MG/ML IV SOLN
INTRAVENOUS | Status: DC | PRN
  Administered 2012-11-18: 50 mg via INTRAVENOUS

## 2012-11-18 MED ORDER — ONDANSETRON HCL 4 MG/2ML IJ SOLN
INTRAMUSCULAR | Status: DC | PRN
  Administered 2012-11-18: 4 mg via INTRAVENOUS

## 2012-11-18 MED ORDER — FENTANYL CITRATE 0.05 MG/ML IJ SOLN
INTRAMUSCULAR | Status: AC
  Filled 2012-11-18: qty 2

## 2012-11-18 MED ORDER — MIDAZOLAM HCL 5 MG/5ML IJ SOLN
INTRAMUSCULAR | Status: DC | PRN
  Administered 2012-11-18: 2 mg via INTRAVENOUS

## 2012-11-18 MED ORDER — SODIUM CHLORIDE 0.9 % IV SOLN: 250.0000 mL | INTRAVENOUS | Status: DC | PRN

## 2012-11-18 MED ORDER — LACTATED RINGERS IR SOLN
Status: DC | PRN
  Administered 2012-11-18: 1000 mL

## 2012-11-18 MED ORDER — IOHEXOL 300 MG/ML  SOLN
INTRAMUSCULAR | Status: AC
  Filled 2012-11-18: qty 1

## 2012-11-18 MED ORDER — HEMOSTATIC AGENTS (NO CHARGE) OPTIME
TOPICAL | Status: DC | PRN
  Administered 2012-11-18: 1 via TOPICAL

## 2012-11-18 MED ORDER — ACETAMINOPHEN 650 MG RE SUPP
650.0000 mg | RECTAL | Status: DC | PRN
  Filled 2012-11-18: qty 1

## 2012-11-18 SURGICAL SUPPLY — 45 items
APL SKNCLS STERI-STRIP NONHPOA (GAUZE/BANDAGES/DRESSINGS) ×1
APPLIER CLIP 5 13 M/L LIGAMAX5 (MISCELLANEOUS) ×2
APR CLP MED LRG 5 ANG JAW (MISCELLANEOUS) ×1
BAG SPEC RTRVL LRG 6X4 10 (ENDOMECHANICALS)
BENZOIN TINCTURE PRP APPL 2/3 (GAUZE/BANDAGES/DRESSINGS) ×2 IMPLANT
CABLE HIGH FREQUENCY MONO STRZ (ELECTRODE) ×2 IMPLANT
CANISTER SUCTION 2500CC (MISCELLANEOUS) ×2 IMPLANT
CHLORAPREP W/TINT 26ML (MISCELLANEOUS) ×2 IMPLANT
CLIP APPLIE 5 13 M/L LIGAMAX5 (MISCELLANEOUS) ×1 IMPLANT
CLOTH BEACON ORANGE TIMEOUT ST (SAFETY) ×2 IMPLANT
COVER MAYO STAND STRL (DRAPES) IMPLANT
DECANTER SPIKE VIAL GLASS SM (MISCELLANEOUS) ×2 IMPLANT
DEVICE TROCAR PUNCTURE CLOSURE (ENDOMECHANICALS) ×2 IMPLANT
DRAPE C-ARM 42X72 X-RAY (DRAPES) IMPLANT
DRAPE LAPAROSCOPIC ABDOMINAL (DRAPES) ×2 IMPLANT
DRSG TEGADERM 2-3/8X2-3/4 SM (GAUZE/BANDAGES/DRESSINGS) ×1 IMPLANT
DRSG TEGADERM 4X4.75 (GAUZE/BANDAGES/DRESSINGS) ×1 IMPLANT
ELECT REM PT RETURN 9FT ADLT (ELECTROSURGICAL) ×2
ELECTRODE REM PT RTRN 9FT ADLT (ELECTROSURGICAL) ×1 IMPLANT
GAUZE SPONGE 2X2 8PLY STRL LF (GAUZE/BANDAGES/DRESSINGS) ×1 IMPLANT
GLOVE BIO SURGEON STRL SZ7.5 (GLOVE) ×2 IMPLANT
GLOVE BIOGEL PI IND STRL 7.0 (GLOVE) ×2 IMPLANT
GLOVE BIOGEL PI INDICATOR 7.0 (GLOVE) ×2
GLOVE ECLIPSE 6.5 STRL STRAW (GLOVE) ×2 IMPLANT
GOWN STRL NON-REIN LRG LVL3 (GOWN DISPOSABLE) ×2 IMPLANT
GOWN STRL REIN XL XLG (GOWN DISPOSABLE) ×4 IMPLANT
HEMOSTAT SNOW SURGICEL 2X4 (HEMOSTASIS) ×1 IMPLANT
HEMOSTAT SURGICEL 4X8 (HEMOSTASIS) IMPLANT
KIT BASIN OR (CUSTOM PROCEDURE TRAY) ×2 IMPLANT
NEEDLE INSUFFLATION 14GA 120MM (NEEDLE) ×2 IMPLANT
NS IRRIG 1000ML POUR BTL (IV SOLUTION) IMPLANT
POUCH SPECIMEN RETRIEVAL 10MM (ENDOMECHANICALS) IMPLANT
SCISSORS LAP 5X35 DISP (ENDOMECHANICALS) ×2 IMPLANT
SET CHOLANGIOGRAPH MIX (MISCELLANEOUS) IMPLANT
SET IRRIG TUBING LAPAROSCOPIC (IRRIGATION / IRRIGATOR) ×2 IMPLANT
SOLUTION ANTI FOG 6CC (MISCELLANEOUS) ×2 IMPLANT
SPONGE GAUZE 2X2 STER 10/PKG (GAUZE/BANDAGES/DRESSINGS) ×1
SPONGE GAUZE 4X4 12PLY (GAUZE/BANDAGES/DRESSINGS) ×2 IMPLANT
STRIP CLOSURE SKIN 1/2X4 (GAUZE/BANDAGES/DRESSINGS) ×2 IMPLANT
SUT MNCRL AB 4-0 PS2 18 (SUTURE) ×2 IMPLANT
TOWEL OR 17X26 10 PK STRL BLUE (TOWEL DISPOSABLE) ×2 IMPLANT
TRAY LAP CHOLE (CUSTOM PROCEDURE TRAY) ×2 IMPLANT
TROCAR BLADELESS OPT 5 75 (ENDOMECHANICALS) ×6 IMPLANT
TROCAR XCEL NON-BLD 11X100MML (ENDOMECHANICALS) ×2 IMPLANT
TUBING INSUFFLATION 10FT LAP (TUBING) ×2 IMPLANT

## 2012-11-18 NOTE — Transfer of Care (Signed)
Immediate Anesthesia Transfer of Care Note  Patient: Veronica Huff  Procedure(s) Performed: Procedure(s) (LRB) with comments: LAPAROSCOPIC CHOLECYSTECTOMY (N/A)  Patient Location: PACU  Anesthesia Type:General  Level of Consciousness: awake and alert   Airway & Oxygen Therapy: Patient Spontanous Breathing  Post-op Assessment: Report given to PACU RN and Post -op Vital signs reviewed and stable  Post vital signs: Reviewed and stable  Complications: No apparent anesthesia complications

## 2012-11-18 NOTE — Anesthesia Postprocedure Evaluation (Signed)
  Anesthesia Post-op Note  Patient: Careers information officer  Procedure(s) Performed: Procedure(s) (LRB): LAPAROSCOPIC CHOLECYSTECTOMY (N/A)  Patient Location: PACU  Anesthesia Type: General  Level of Consciousness: awake and alert   Airway and Oxygen Therapy: Patient Spontanous Breathing  Post-op Pain: mild  Post-op Assessment: Post-op Vital signs reviewed, Patient's Cardiovascular Status Stable, Respiratory Function Stable, Patent Airway and No signs of Nausea or vomiting  Last Vitals:  Filed Vitals:   11/18/12 1215  BP: 103/41  Pulse: 89  Temp: 36.4 C  Resp: 14    Post-op Vital Signs: stable   Complications: No apparent anesthesia complications

## 2012-11-18 NOTE — Anesthesia Preprocedure Evaluation (Signed)
Anesthesia Evaluation  Patient identified by MRN, date of birth, ID band Patient awake    Reviewed: Allergy & Precautions, H&P , NPO status , Patient's Chart, lab work & pertinent test results  Airway Mallampati: II TM Distance: <3 FB Neck ROM: Limited    Dental No notable dental hx.    Pulmonary sleep apnea ,  breath sounds clear to auscultation  + decreased breath sounds      Cardiovascular negative cardio ROS  Rhythm:Regular Rate:Normal     Neuro/Psych Quadriplegic  CVA, Residual Symptoms negative psych ROS   GI/Hepatic negative GI ROS, Neg liver ROS,   Endo/Other  negative endocrine ROS  Renal/GU negative Renal ROS  negative genitourinary   Musculoskeletal negative musculoskeletal ROS (+)   Abdominal   Peds negative pediatric ROS (+)  Hematology negative hematology ROS (+)   Anesthesia Other Findings   Reproductive/Obstetrics negative OB ROS                           Anesthesia Physical Anesthesia Plan  ASA: III  Anesthesia Plan: General   Post-op Pain Management:    Induction: Intravenous  Airway Management Planned: Tracheostomy  Additional Equipment:   Intra-op Plan:   Post-operative Plan:   Informed Consent: I have reviewed the patients History and Physical, chart, labs and discussed the procedure including the risks, benefits and alternatives for the proposed anesthesia with the patient or authorized representative who has indicated his/her understanding and acceptance.     Plan Discussed with: CRNA and Surgeon  Anesthesia Plan Comments:         Anesthesia Quick Evaluation

## 2012-11-18 NOTE — Op Note (Signed)
Pre Operative Diagnosis: biliary colic   Post Operative Diagnosis: same  Surgeon: Dr. Axel Filler   Procedure: laparoscopic cholecystectomy  Assistant: none  Anesthesia: Gen. Endotracheal anesthesia   EBL: 20 cc  Complications:  Counts: reported as correct x 2   Findings: The patient had most of the gallbladder. Patient is a former liver bed was hemostatic case. A piece of fibrillar hemostatic agent was placed in the liver bed.  Indications for procedure: patient is a 25 year old female with right upper quadrant pain. Patient nausea vomiting with pain and decided to have this electively removed.  Details of the procedure:  The patient was taken to the operating and placed in the supine position with bilateral SCDs in place. A time out was called and all facts were verified. A pneumoperitoneum was obtained via A Veress needle technique to a pressure of 14mm of mercury. A 5mm trochar was then placed in the right upper quadrant under visualization, and there were no injuries to any abdominal organs. A 11 mm port was then placed in the umbilical region after infiltrating with local anesthesia under direct visualization. A second and third epigastric port and right lower quadrant port placement under direct visualization, respectively. The gallbladder was identified and retracted, the peritoneum was then sharply dissected from the gallbladder and this dissection was carried down to Calot's triangle. The gallbladder was identified and stripped away circumferentially and seen going into the gallbladder 360.   2 clips were placed proximally one distally and the cystic duct transected. The cystic artery was identified and 2 clips placed proximally and one distally and transected.  We then proceeded to remove the gallbladder off the hepatic fossa with Bovie cautery. A Latex retrieval bag was then placed in the abdomen and gallbladder placed in the bag. There was some oozing coming from the hepatic  fossa. This was Bovie cautery again hemostasis. A piece fibrillar hemostatic agent was placed in the hepatic bed. The hepatic fossa was then reexamined and hemostasis was achieved with Bovie cautery and was excellent at the end of the case. The subhepatic fossa and perihepatic fossa was then irrigated until the effluent was clear. The 11 mm trocar fascia was reapproximated with the Endo Close #1 Vicryl.  The pneumoperitoneum was evacuated and all trochars removed under direct visulalization.  The skin was then closed with 4-0 Monocryl and the skin dressed with Steri-Strips, gauze, and tape.  The patient was awaken from general anesthesia and taken to the recovery room in stable condition.

## 2012-11-18 NOTE — H&P (View-Only) (Signed)
Patient ID: Veronica Huff, female   DOB: 06/06/1986, 26 y.o.   MRN: [id number]  Chief Complaint  Patient presents with  . Abdominal Pain    HPI Veronica Huff is a 26 y.o. female.  Is referred by Dr. Hayes for evaluation for possible biliary dyskinesia versus colic. Patient is a 26-year-old female wheelchair-bound after stroke in 2000.  Patient's had bouts of nausea without any emesis over the last 2 years. Patient's undergone ultrasound revealed multiple gallstones. Patient has a HIDA scan which reveals no filling of the gallbladder with a normal ejection fraction. Patient has been worked up by GI with upper endoscopy suite are negative at this time. She's been placed on PPIs without relief. HPI  Past Medical History  Diagnosis Date  . CVA (cerebral infarction)   . Quadriplegia   . Sleep apnea   . UTI (urinary tract infection)     Past Surgical History  Procedure Date  . Ileostomy 2006  . Esophagogastroduodenoscopy   . Spine surgery 2004,2011  . Trachiotomy 2000  . Gastrostomy tube placement 2000  . Bronchosocpy 2006  . Throat surgery 2004  . Wisdom tooth extraction 2008  . Bladder surgery 2009,2012  . Tendon repair left wrist 2012    History reviewed. No pertinent family history.  Social History History  Substance Use Topics  . Smoking status: Never Smoker   . Smokeless tobacco: Not on file  . Alcohol Use: No    Allergies  Allergen Reactions  . Ciprofloxacin   . Erythromycin   . Morphine And Related Other (See Comments)    Goes crazy  . Tobramycin   . Trazodone And Nefazodone     Current Outpatient Prescriptions  Medication Sig Dispense Refill  . baclofen (LIORESAL) 20 MG tablet Take 40 mg by mouth 4 (four) times daily.      . bisacodyl (DULCOLAX) 10 MG suppository Place 10 mg rectally every other day as needed. constipation      . cetirizine (ZYRTEC) 10 MG tablet Take 10 mg by mouth daily.      . chlorhexidine (PERIDEX) 0.12 % solution Use as directed  15-30 mLs in the mouth or throat 2 (two) times daily. Rinse and spit      . dantrolene (DANTRIUM) 50 MG capsule Take 50-100 mg by mouth 3 (three) times daily. Takes 2 capsules(100mg) every morning, 1 capsule(50mg) midday and takes 2 capsules(100mg) every night      . Fluocinolone Acetonide (CAPEX) 0.01 % SHAM Apply 1 application topically daily.      . hydrocortisone 1 % lotion Apply 1 application topically 2 (two) times daily.      . imipramine (TOFRANIL) 25 MG tablet Take 75-100 mg by mouth at bedtime.      . mupirocin ointment (BACTROBAN) 2 % 1 application by Tracheal Tube route daily. Applies to trach-stoma site      . neomycin-polymyxin B (NEOSPORIN GU IRRIGANT) SOLN 30-40 mLs by GU irrigant route at bedtime.      . nitrofurantoin (MACRODANTIN) 50 MG capsule Take 50 mg by mouth at bedtime.      . nystatin (MYCOSTATIN/NYSTOP) 100000 UNIT/GM POWD Apply 1 g topically 2 (two) times daily as needed. redness      . olopatadine (PATANOL) 0.1 % ophthalmic solution Place 1 drop into both eyes 2 (two) times daily.      . omeprazole (PRILOSEC) 40 MG capsule Take 40 mg by mouth daily.      . polyethylene glycol (MIRALAX / GLYCOLAX) packet   Take 17 g by mouth daily.      . Polyethylene Glycol 400 (BLINK TEARS) 0.25 % GEL Apply 1-2 drops to eye 2 (two) times daily as needed. Excessive dryness      . sertraline (ZOLOFT) 100 MG tablet Take 100 mg by mouth at bedtime.      . sodium chloride irrigation 0.9 % irrigation 50 mLs by GU irrigant route every 6 (six) hours as needed. Sediment/ lack of patency      . sodium fluoride (PREVIDENT 5000 PLUS) 1.1 % CREA dental cream Place 1 application onto teeth 2 (two) times daily.      . solifenacin (VESICARE) 10 MG tablet Take 5 mg by mouth daily.        Review of Systems Review of Systems  Constitutional: Negative.   HENT: Negative.   Respiratory: Negative.   Cardiovascular: Negative.   Gastrointestinal: Negative.   Musculoskeletal: Negative.     Last  menstrual period 09/18/2012.  Physical Exam Physical Exam  Constitutional: She is oriented to person, place, and time. She appears well-developed and well-nourished.  HENT:  Head: Normocephalic and atraumatic.  Eyes: Conjunctivae normal and EOM are normal. Pupils are equal, round, and reactive to light.  Neck: Neck supple.  Cardiovascular: Normal rate and normal heart sounds.   Pulmonary/Chest: Effort normal and breath sounds normal.  Abdominal: Soft. There is no tenderness. There is no rebound.  Musculoskeletal: Normal range of motion.  Neurological: She is alert and oriented to person, place, and time.    Data Reviewed reviewed  Assessment    26-year-old female with possible biliary colic.    Plan    1. Will proceed to the operating room for laparoscopic cholecystectomy with no cholangiogram. 2. All risks and benefits were discussed with the patient, to generally include infection, bleeding, damage to surrounding structures, and recurrence. Alternatives were offered and described.  All questions were answered and the patient voiced understanding of the procedure and wishes to proceed at this point.        Carlin Attridge Jr., Rhea Thrun 10/25/2012, 3:21 PM    

## 2012-11-18 NOTE — Interval H&P Note (Signed)
History and Physical Interval Note:  11/18/2012 9:06 AM  Veronica Huff  has presented today for surgery, with the diagnosis of biliary colic  The various methods of treatment have been discussed with the patient and family. After consideration of risks, benefits and other options for treatment, the patient has consented to  Procedure(s) (LRB) with comments: LAPAROSCOPIC CHOLECYSTECTOMY (N/A) as a surgical intervention .  The patient's history has been reviewed, patient examined, no change in status, stable for surgery.  I have reviewed the patient's chart and labs.  Questions were answered to the patient's satisfaction.     Marigene Ehlers., Jed Limerick

## 2012-11-19 ENCOUNTER — Encounter (HOSPITAL_COMMUNITY): Payer: Self-pay | Admitting: General Surgery

## 2012-12-03 ENCOUNTER — Encounter (INDEPENDENT_AMBULATORY_CARE_PROVIDER_SITE_OTHER): Payer: Self-pay | Admitting: General Surgery

## 2012-12-03 ENCOUNTER — Ambulatory Visit (INDEPENDENT_AMBULATORY_CARE_PROVIDER_SITE_OTHER): Payer: Medicaid Other | Admitting: General Surgery

## 2012-12-03 ENCOUNTER — Telehealth (INDEPENDENT_AMBULATORY_CARE_PROVIDER_SITE_OTHER): Payer: Self-pay | Admitting: General Surgery

## 2012-12-03 VITALS — BP 126/62 | HR 90 | Temp 98.2°F | Resp 18

## 2012-12-03 DIAGNOSIS — Z9049 Acquired absence of other specified parts of digestive tract: Secondary | ICD-10-CM

## 2012-12-03 DIAGNOSIS — Z9089 Acquired absence of other organs: Secondary | ICD-10-CM

## 2012-12-03 NOTE — Progress Notes (Signed)
Patient ID: Veronica Huff, female   DOB: 05/16/1986, 27 y.o.   MRN: [id number] The patient is a 27 year old female status post laparoscopic cholecystectomy.  The patient has been doing well postoperatively. She states that the nausea has resolved on its own, but still feels it may be too early to tell.   The pathology showed chronic cholecystitis and cholelithiasis it was discussed with the patient.  On exam: Wounds are clean dry and intact no erythema or drainage.  Assessment and plan:  The patient follow up when necessary

## 2012-12-03 NOTE — Telephone Encounter (Signed)
Called to see if patient could come in sooner to today's appt 12/03/12 but was told that she rides the [employer] bus and they have to have a 24hr notice in advance

## 2012-12-12 ENCOUNTER — Encounter (INDEPENDENT_AMBULATORY_CARE_PROVIDER_SITE_OTHER): Payer: Self-pay

## 2013-01-04 ENCOUNTER — Other Ambulatory Visit: Payer: Self-pay

## 2013-03-24 ENCOUNTER — Ambulatory Visit (INDEPENDENT_AMBULATORY_CARE_PROVIDER_SITE_OTHER): Payer: Medicaid Other | Admitting: Obstetrics

## 2013-03-24 ENCOUNTER — Ambulatory Visit: Payer: Self-pay | Admitting: Obstetrics & Gynecology

## 2013-03-24 ENCOUNTER — Encounter: Payer: Self-pay | Admitting: Obstetrics

## 2013-03-24 VITALS — BP 116/77 | HR 88 | Temp 98.9°F | Wt 140.0 lb

## 2013-03-24 DIAGNOSIS — Z0000 Encounter for general adult medical examination without abnormal findings: Secondary | ICD-10-CM

## 2013-03-24 NOTE — Progress Notes (Deleted)
Subjective:     Veronica Huff is a 27 y.o. female here for a routine exam.  Current complaints: Pt in office today for an annual exam. Pt states she has never had a pap smear. Pt states she has some questions about the potential on possibly having a baby one day. Personal health questionnaire reviewed: yes.   Gynecologic History Patient's last menstrual period was 03/03/2013. Contraception: none Last Pap: never. Results were: n/a Last mammogram: n/a. Results were: n/a  Obstetric History OB History   Grav Para Term Preterm Abortions TAB SAB Ect Mult Living                   The following portions of the patient's history were reviewed and updated as appropriate: allergies, current medications, past family history, past medical history, past social history, past surgical history and problem list.  Review of Systems Pertinent items are noted in HPI.    Objective:    General appearance: alert and no distress Breasts: normal appearance, no masses or tenderness Abdomen: normal findings: soft, non-tender Pelvic: cervix normal in appearance, external genitalia normal, no adnexal masses or tenderness, no cervical motion tenderness, uterus normal size, shape, and consistency and vagina normal without discharge    Assessment:    Healthy female exam.    Plan:    Follow up in: 1 year.

## 2013-03-25 ENCOUNTER — Encounter: Payer: Self-pay | Admitting: Obstetrics

## 2013-03-25 NOTE — Addendum Note (Signed)
Addended by: Coral Ceo A on: 03/25/2013 09:36 AM   Modules accepted: Level of Service

## 2013-03-25 NOTE — Progress Notes (Signed)
Previous note entered in error.  I did not see patient.

## 2013-06-02 ENCOUNTER — Other Ambulatory Visit: Payer: Self-pay | Admitting: Gastroenterology

## 2013-06-02 DIAGNOSIS — R112 Nausea with vomiting, unspecified: Secondary | ICD-10-CM

## 2013-06-06 ENCOUNTER — Other Ambulatory Visit: Payer: Medicaid Other

## 2013-07-05 ENCOUNTER — Emergency Department (HOSPITAL_COMMUNITY)
Admission: EM | Admit: 2013-07-05 | Discharge: 2013-07-05 | Disposition: A | Discharge status: Home / Self Care | Payer: Medicaid Other | Attending: Emergency Medicine | Admitting: Emergency Medicine

## 2013-07-05 ENCOUNTER — Encounter (HOSPITAL_COMMUNITY): Payer: Self-pay

## 2013-07-05 DIAGNOSIS — Z79899 Other long term (current) drug therapy: Secondary | ICD-10-CM | POA: Insufficient documentation

## 2013-07-05 DIAGNOSIS — F3289 Other specified depressive episodes: Secondary | ICD-10-CM | POA: Insufficient documentation

## 2013-07-05 DIAGNOSIS — Z8673 Personal history of transient ischemic attack (TIA), and cerebral infarction without residual deficits: Secondary | ICD-10-CM | POA: Insufficient documentation

## 2013-07-05 DIAGNOSIS — N390 Urinary tract infection, site not specified: Secondary | ICD-10-CM | POA: Insufficient documentation

## 2013-07-05 DIAGNOSIS — M62838 Other muscle spasm: Secondary | ICD-10-CM | POA: Insufficient documentation

## 2013-07-05 DIAGNOSIS — E860 Dehydration: Secondary | ICD-10-CM | POA: Insufficient documentation

## 2013-07-05 DIAGNOSIS — F329 Major depressive disorder, single episode, unspecified: Secondary | ICD-10-CM | POA: Insufficient documentation

## 2013-07-05 DIAGNOSIS — Z9889 Other specified postprocedural states: Secondary | ICD-10-CM | POA: Insufficient documentation

## 2013-07-05 LAB — URINALYSIS, ROUTINE W REFLEX MICROSCOPIC
Glucose, UA: NEGATIVE mg/dL
Specific Gravity, Urine: 1.008 (ref 1.005–1.030)

## 2013-07-05 LAB — CBC WITH DIFFERENTIAL/PLATELET
Eosinophils Relative: 0 % (ref 0–5)
HCT: 36.2 % (ref 36.0–46.0)
Hemoglobin: 12.9 g/dL (ref 12.0–15.0)
Lymphocytes Relative: 22 % (ref 12–46)
Lymphs Abs: 1.8 10*3/uL (ref 0.7–4.0)
MCV: 84.6 fL (ref 78.0–100.0)
Monocytes Absolute: 0.6 10*3/uL (ref 0.1–1.0)
Monocytes Relative: 7 % (ref 3–12)
RBC: 4.28 MIL/uL (ref 3.87–5.11)
WBC: 8.4 10*3/uL (ref 4.0–10.5)

## 2013-07-05 LAB — COMPREHENSIVE METABOLIC PANEL
ALT: 14 U/L (ref 0–35)
CO2: 27 mEq/L (ref 19–32)
Calcium: 9.2 mg/dL (ref 8.4–10.5)
Creatinine, Ser: 0.51 mg/dL (ref 0.50–1.10)
GFR calc Af Amer: >90 mL/min (ref >90)
GFR calc non Af Amer: >90 mL/min (ref >90)
Glucose, Bld: 95 mg/dL (ref 70–99)
Sodium: 136 mEq/L (ref 135–145)

## 2013-07-05 LAB — URINE MICROSCOPIC-ADD ON

## 2013-07-05 MED ORDER — ONDANSETRON HCL 4 MG/2ML IJ SOLN
4.0000 mg | Freq: Once | INTRAMUSCULAR | Status: AC
  Administered 2013-07-05: 4 mg via INTRAVENOUS
  Filled 2013-07-05: qty 2

## 2013-07-05 MED ORDER — DIAZEPAM 5 MG PO TABS: 5.0000 mg | ORAL_TABLET | Freq: Three times a day (TID) | ORAL | Status: DC | PRN

## 2013-07-05 MED ORDER — SODIUM CHLORIDE 0.9 % IV SOLN
Freq: Once | INTRAVENOUS | Status: AC
  Administered 2013-07-05: 05:00:00 via INTRAVENOUS

## 2013-07-05 MED ORDER — SODIUM CHLORIDE 0.9 % IV BOLUS (SEPSIS): 1000.0000 mL | Freq: Once | INTRAVENOUS | Status: DC

## 2013-07-05 MED ORDER — DIAZEPAM 5 MG PO TABS
5.0000 mg | ORAL_TABLET | Freq: Once | ORAL | Status: AC
  Administered 2013-07-05: 5 mg via ORAL
  Filled 2013-07-05: qty 1

## 2013-07-05 MED ORDER — DEXTROSE 5 % IV SOLN
1.0000 g | Freq: Once | INTRAVENOUS | Status: AC
Start: 2013-07-05 — End: 2013-07-05
  Administered 2013-07-05: 1 g via INTRAVENOUS
  Filled 2013-07-05: qty 10

## 2013-07-05 MED ORDER — ONDANSETRON HCL 4 MG/2ML IJ SOLN
INTRAMUSCULAR | Status: AC
  Filled 2013-07-05: qty 2

## 2013-07-05 MED ORDER — ONDANSETRON 8 MG PO TBDP: 8.0000 mg | ORAL_TABLET | Freq: Three times a day (TID) | ORAL | Status: DC | PRN

## 2013-07-05 MED ORDER — CEPHALEXIN 500 MG PO CAPS: 500.0000 mg | ORAL_CAPSULE | Freq: Four times a day (QID) | ORAL | Status: DC

## 2013-07-05 MED ORDER — DIAZEPAM 5 MG/ML IJ SOLN
2.5000 mg | Freq: Once | INTRAMUSCULAR | Status: AC
  Administered 2013-07-05: 2.5 mg via INTRAVENOUS
  Filled 2013-07-05: qty 2

## 2013-07-05 MED ORDER — ONDANSETRON HCL 4 MG/2ML IJ SOLN
4.0000 mg | Freq: Once | INTRAMUSCULAR | Status: AC
  Administered 2013-07-05: 4 mg via INTRAVENOUS

## 2013-07-05 NOTE — ED Notes (Signed)
EMS-EMS called out for muscle spasms. Pt attempted to spoke marijuana to relieve the spasms but it did not. Pt also thinks she may have a UTI. Pt is a quad with trach, pt has home health nurse 24/7 and pt had trach suctioned and changed before she came to ED.

## 2013-07-05 NOTE — ED Provider Notes (Signed)
Patient is a 27 year old female with history of quadriplegia, here with muscle spasms. He should signed out to me at shift change. Patient's lab work is back to normal. Patient's urinalysis shows infection. Patient has been receiving IV fluids in the department. Patient also received Zofran for nausea and Valium for muscle spasms and states she feels much better. Patient was given another IV saline bolus and 1 g of Rocephin for a urinary tract infection. I have looked up prior urine cultures and it was sensitive for all antibiotics except for Macrobid. New culture sent today. Well discharge home with Keflex and close followup with her primary care doctor for recheck of the urine.  Filed Vitals:   07/05/13 0600  BP: 116/65  Pulse: 100  Temp:   Resp: 19     Maryah Marinaro A Charvis Lightner, PA-C 07/05/13 0703

## 2013-07-05 NOTE — ED Notes (Signed)
Pt reports poor PO intake recently.

## 2013-07-05 NOTE — ED Notes (Signed)
Reviewed D/C paperwork.  Pt aware of follow up care with PCP and Rx given.

## 2013-07-05 NOTE — Progress Notes (Signed)
Pt arrived to ED with 5.0 [facility] trach. Vitals are stable on room air. Breath sounds clear. No distress.

## 2013-07-05 NOTE — ED Notes (Signed)
Pt requesting a catheter kit to take home.  Advised that we do not send kits home with pts.  Pt also requesting to speak with PA.  PA notified.

## 2013-07-05 NOTE — ED Notes (Signed)
Respiratory at bedside.

## 2013-07-05 NOTE — ED Notes (Addendum)
Pt requesting Kelflex be called in versus printed.  Multiple Rx already printed.  PA notified.  Rx remain printed as someone will have to pick the Rx up regardless.  [facility] called for transport.

## 2013-07-05 NOTE — ED Provider Notes (Signed)
CSN: [id number]     Arrival date & time 07/05/13  0419 History     First MD Initiated Contact with Patient 07/05/13 0434     Chief Complaint  Patient presents with  . Spasms   (Consider location/radiation/quality/duration/timing/severity/associated sxs/prior Treatment) HPI History provided by pt.   Pt has h/o CVA w/ residual quadriplegia, has a trach and suprapubic stoma for in-and-out cath.  Developed spasms of bladder at 3am today, followed shortly after by spasming BLE.  Her boyfriend cathed her and urine was very concentrated.  She is concerned that she may be dehydrated, and/or have a UTI.  Experiences bladder spasms every time her bladder needs to be emptied, but never to the severity that she had this morning.  She has had very little to drink today because her home health nurse called out of work.  Muscle spasms occur on a daily basis, but have been much worse since she discontinued her dantrolene for financial reasons.  No recent illnesses including fever, cough, vomiting, diarrhea, abdominal pain.  Smoke marijuana last night but no more so than usual.   Past Medical History  Diagnosis Date  . CVA (cerebral infarction)     in 2011 at age 27--quadriplegic after stroke-has trach-lives with boyfriend-motorized w/c-joystick to chin to drive w/c--I & O cath thru suprapubic stoma - has nursing care 16 hrs daily and boyfriend able to help pt the other hours of day.  . Quadriplegia   . UTI (urinary tract infection)   . Sleep apnea     pt on ventilator at night -has oxygen but rarely needs unless she has a cold and sats drop  . Depression     zoloft has helped-not having any current depression  . Stroke   . Swelling     feet and legs-mostly in feet  . Headache(784.0)     h/a's prior to imipramine-none now   Past Surgical History  Procedure Laterality Date  . Ileostomy  2006  . Esophagogastroduodenoscopy    . Spine surgery  2004,2011  . Trachiotomy  2000  . Gastrostomy tube  placement  2000  . Bronchosocpy  2006  . Throat surgery  2004  . Wisdom tooth extraction  2008  . Bladder surgery  2009,2012  . Tendon repair left wrist  2012  . Removal of gastrostomy tube    . Cholecystectomy  11/18/2012    Procedure: LAPAROSCOPIC CHOLECYSTECTOMY;  Surgeon: Axel Filler, MD;  Location: [facility];  Service: General;  Laterality: N/A;   Family History  Problem Relation Age of Onset  . Diabetes Mother   . Cancer Maternal Aunt   . Diabetes Maternal Grandfather   . Heart disease Maternal Grandfather   . Heart disease Paternal Grandfather    History  Substance Use Topics  . Smoking status: Never Smoker   . Smokeless tobacco: Not on file  . Alcohol Use: No   OB History   Grav Para Term Preterm Abortions TAB SAB Ect Mult Living                 Review of Systems  All other systems reviewed and are negative.    Allergies  Ciprofloxacin; Erythromycin; Morphine and related; Tobramycin; and Trazodone and nefazodone  Home Medications   Current Outpatient Rx  Name  Route  Sig  Dispense  Refill  . baclofen (LIORESAL) 20 MG tablet   Oral   Take 40 mg by mouth 4 (four) times daily.         Marland Kitchen  bisacodyl (DULCOLAX) 10 MG suppository   Rectal   Place 10 mg rectally every other day as needed. constipation         . bismuth subsalicylate (PEPTO BISMOL) 262 MG/15ML suspension   Oral   Take 15 mLs by mouth every 6 (six) hours as needed for indigestion.         Marland Kitchen imipramine (TOFRANIL) 25 MG tablet   Oral   Take 75-100 mg by mouth at bedtime.         Marland Kitchen ketoconazole (NIZORAL) 2 % shampoo   Topical   Apply 1 application topically 2 (two) times a week.         . loratadine (CLARITIN) 10 MG tablet   Oral   Take 10 mg by mouth daily. At night         . mupirocin ointment (BACTROBAN) 2 %   Tracheal Tube   1 application by Tracheal Tube route daily. Applies to trach-stoma site         . neomycin-polymyxin B (NEOSPORIN GU IRRIGANT) SOLN   GU  irrigant   30-40 mLs by GU irrigant route at bedtime. Instilled thru I&O catheter at bedtime-to prevent uti         . nitrofurantoin (MACRODANTIN) 50 MG capsule   Oral   Take 50 mg by mouth at bedtime.         . Olopatadine HCl (PATADAY) 0.2 % SOLN   Both Eyes   Place 1 drop into both eyes 2 (two) times daily.         . polyethylene glycol (MIRALAX / GLYCOLAX) packet   Oral   Take 17 g by mouth daily.         . sertraline (ZOLOFT) 100 MG tablet   Oral   Take 100 mg by mouth at bedtime.         . solifenacin (VESICARE) 10 MG tablet   Oral   Take 10 mg by mouth daily. Every am         . sucralfate (CARAFATE) 1 GM/10ML suspension   Oral   Take 1 g by mouth 4 (four) times daily.          BP 116/65  Pulse 100  Temp(Src) 98.7 F (37.1 C) (Oral)  Resp 19  SpO2 97% Physical Exam  Nursing note and vitals reviewed. Constitutional: She is oriented to person, place, and time. She appears well-developed and well-nourished. No distress.  HENT:  Head: Normocephalic and atraumatic.  Eyes:  Normal appearance  Neck: Normal range of motion.  Cardiovascular: Normal rate and regular rhythm.   Pulmonary/Chest: Effort normal and breath sounds normal. No respiratory distress.  Abdominal: Soft. Bowel sounds are normal. She exhibits no distension. There is no tenderness.  obese  Musculoskeletal: She exhibits no edema.  quadriplegia  Neurological: She is alert and oriented to person, place, and time.  Skin: Skin is warm and dry. No rash noted.  Hives on chest  Psychiatric: She has a normal mood and affect. Her behavior is normal.    ED Course   Procedures (including critical care time)  Labs Reviewed  URINALYSIS, ROUTINE W REFLEX MICROSCOPIC - Abnormal; Notable for the following:    APPearance CLOUDY (*)    Hgb urine dipstick MODERATE (*)    Leukocytes, UA LARGE (*)    All other components within normal limits  CBC WITH DIFFERENTIAL - Abnormal; Notable for the  following:    Platelets 125 (*)    All other components  within normal limits  URINE MICROSCOPIC-ADD ON - Abnormal; Notable for the following:    Bacteria, UA FEW (*)    All other components within normal limits  URINE CULTURE  COMPREHENSIVE METABOLIC PANEL   No results found. 1. Dehydration     MDM  27yo quadriplegic F w/ trach and supapubic stoma presents w/ bladder and muscle spasm.  Has both on a daily basis at baseline, but not to this severity.  Dantrolene recently d/c'd d/t finances and pt had very little to drink yesterday; both of which may be contributing to sx.  U/A is pending.  Pt receiving IVF and 2.5mg  valium as well as zofran for chronic nausea.  Kirichenko, PA-C to resume care.    Otilio Miu, PA-C 07/05/13 [phone]

## 2013-07-05 NOTE — ED Provider Notes (Signed)
Medical screening examination/treatment/procedure(s) were performed by non-physician practitioner and as supervising physician I was immediately available for consultation/collaboration.   Glynn Octave, MD 07/05/13 [phone]

## 2013-07-05 NOTE — ED Provider Notes (Signed)
Medical screening examination/treatment/procedure(s) were performed by non-physician practitioner and as supervising physician I was immediately available for consultation/collaboration.   Glynn Octave, MD 07/05/13 [phone]

## 2013-07-06 LAB — URINE CULTURE

## 2013-09-25 ENCOUNTER — Other Ambulatory Visit: Payer: Self-pay

## 2013-12-15 ENCOUNTER — Other Ambulatory Visit: Payer: Self-pay | Admitting: Urology

## 2013-12-17 ENCOUNTER — Other Ambulatory Visit: Payer: Self-pay | Admitting: Urology

## 2013-12-17 ENCOUNTER — Other Ambulatory Visit (HOSPITAL_COMMUNITY): Payer: Self-pay | Admitting: Diagnostic Radiology

## 2013-12-17 DIAGNOSIS — N319 Neuromuscular dysfunction of bladder, unspecified: Secondary | ICD-10-CM

## 2013-12-17 DIAGNOSIS — N3020 Other chronic cystitis without hematuria: Secondary | ICD-10-CM

## 2013-12-17 DIAGNOSIS — R339 Retention of urine, unspecified: Secondary | ICD-10-CM

## 2013-12-23 ENCOUNTER — Ambulatory Visit
Admission: RE | Admit: 2013-12-23 | Discharge: 2013-12-23 | Disposition: A | Discharge status: Home / Self Care | Payer: Medicaid Other | Source: Ambulatory Visit | Attending: Urology | Admitting: Urology

## 2013-12-23 DIAGNOSIS — N319 Neuromuscular dysfunction of bladder, unspecified: Secondary | ICD-10-CM

## 2013-12-23 DIAGNOSIS — N3020 Other chronic cystitis without hematuria: Secondary | ICD-10-CM

## 2013-12-23 DIAGNOSIS — R339 Retention of urine, unspecified: Secondary | ICD-10-CM

## 2013-12-23 MED ORDER — IOHEXOL 300 MG/ML  SOLN
100.0000 mL | Freq: Once | INTRAMUSCULAR | Status: AC | PRN
  Administered 2013-12-23: 100 mL via INTRAVENOUS

## 2014-01-28 ENCOUNTER — Encounter: Payer: Self-pay | Admitting: *Deleted

## 2014-02-24 ENCOUNTER — Encounter: Payer: Self-pay | Admitting: *Deleted

## 2014-03-25 ENCOUNTER — Encounter: Payer: Medicaid Other | Admitting: Obstetrics & Gynecology

## 2014-04-27 ENCOUNTER — Encounter (HOSPITAL_COMMUNITY): Payer: Self-pay | Admitting: Emergency Medicine

## 2014-04-27 ENCOUNTER — Emergency Department (HOSPITAL_COMMUNITY)
Admission: EM | Admit: 2014-04-27 | Discharge: 2014-04-28 | Disposition: A | Discharge status: Home / Self Care | Payer: Medicaid Other | Attending: Emergency Medicine | Admitting: Emergency Medicine

## 2014-04-27 DIAGNOSIS — Z792 Long term (current) use of antibiotics: Secondary | ICD-10-CM | POA: Insufficient documentation

## 2014-04-27 DIAGNOSIS — Z8673 Personal history of transient ischemic attack (TIA), and cerebral infarction without residual deficits: Secondary | ICD-10-CM | POA: Insufficient documentation

## 2014-04-27 DIAGNOSIS — N390 Urinary tract infection, site not specified: Secondary | ICD-10-CM | POA: Insufficient documentation

## 2014-04-27 DIAGNOSIS — Z79899 Other long term (current) drug therapy: Secondary | ICD-10-CM | POA: Insufficient documentation

## 2014-04-27 LAB — URINALYSIS, ROUTINE W REFLEX MICROSCOPIC
BILIRUBIN URINE: NEGATIVE
GLUCOSE, UA: NEGATIVE mg/dL
Ketones, ur: NEGATIVE mg/dL
Nitrite: NEGATIVE
Protein, ur: 30 mg/dL — AB
SPECIFIC GRAVITY, URINE: 1.011 (ref 1.005–1.030)
Urobilinogen, UA: 0.2 mg/dL (ref 0.0–1.0)
pH: 7 (ref 5.0–8.0)

## 2014-04-27 LAB — URINE MICROSCOPIC-ADD ON

## 2014-04-27 MED ORDER — SODIUM CHLORIDE 0.9 % IV BOLUS (SEPSIS)
1000.0000 mL | Freq: Once | INTRAVENOUS | Status: AC
  Administered 2014-04-27: 1000 mL via INTRAVENOUS

## 2014-04-27 NOTE — ED Notes (Signed)
Per EMS pt reports she catheterized herself 30 minutes ago and was dark in color smaller amount than number. Has bladder stones but has been unable to go to urologist due to transportation complications. Pt is quadriplegic. Alert and oriented not ambulatory. Pt reports pain in lower abdomen.

## 2014-04-27 NOTE — ED Notes (Signed)
Bed: ZO10 Expected date:  Expected time:  Means of arrival:  Comments: EMS bladder spasms; quadriplegic

## 2014-04-27 NOTE — ED Provider Notes (Signed)
CSN: [id number]     Arrival date & time 04/27/14  2136 History   First MD Initiated Contact with Patient 04/27/14 2208     Chief Complaint  Patient presents with  . 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 Patient states the urine was dark in color in a very small amount than she normally is able to get. States she feels dehydrated and has not had much to drink today. States this feels the same as when she had bladder spasms in the past. She has a history of bladder stones but has been unable to go to the urologist [facility]. States she has a history of multiple urinary tract infections, last being about 6 months ago. Denies fever, chills, nausea, vomiting or diarrhea, lightheadedness or dizziness. States she tends to get bladder spasms on the days she uses a suppository, she had a large bowel movement yesterday.  The history is provided by the patient and a parent.    Past Medical History  Diagnosis Date  . CVA (cerebral infarction)     in 2011 at age 58--quadriplegic after stroke-has trach-lives with boyfriend-motorized w/c-joystick to chin to drive w/c--I & O cath thru suprapubic stoma - has nursing care 16 hrs daily and boyfriend able to help pt the other hours of day.  . Quadriplegia   . UTI (urinary tract infection)   . Sleep apnea     pt on ventilator at night -has oxygen but rarely needs unless she has a cold and sats drop  . Depression     zoloft has helped-not having any current depression  . Stroke   . Swelling     feet and legs-mostly in feet  . Headache(784.0)     h/a's prior to imipramine-none now   Past Surgical History   Procedure Laterality Date  . Ileostomy  2006  . Esophagogastroduodenoscopy    . Spine surgery  2004,2011  . Trachiotomy  2000  . Gastrostomy tube placement  2000  . Bronchosocpy  2006  . Throat surgery  2004  . Wisdom tooth extraction  2008  . Bladder surgery  2009,2012  . Tendon repair left wrist  2012  . Removal of gastrostomy tube    . Cholecystectomy  11/18/2012    Procedure: LAPAROSCOPIC CHOLECYSTECTOMY;  Surgeon: Axel FillerArmando Ramirez, MD;  Location: [facility];  Service: General;  Laterality: N/A;   Family History  Problem Relation Age of Onset  . Diabetes Mother   . Cancer Maternal Aunt   . Diabetes Maternal Grandfather   . Heart disease Maternal Grandfather   . Heart disease Paternal Grandfather    History  Substance Use Topics  . Smoking status: Never Smoker   . Smokeless tobacco: Not on file  . Alcohol Use: No   OB History   Grav Para Term Preterm Abortions TAB SAB Ect Mult Living                 Review of Systems  Gastrointestinal: Positive for abdominal pain. Negative for abdominal distention.  Genitourinary: Positive for decreased urine volume.  All other systems reviewed and are negative.     Allergies  Ciprofloxacin; Erythromycin; Morphine and related; Tobramycin; and Trazodone and nefazodone  Home Medications   Prior  to Admission medications   Medication Sig Start Date End Date Taking? Authorizing Provider  acetaminophen (TYLENOL) 500 MG tablet Take 500 mg by mouth every 6 (six) hours as needed (pain).   Yes Historical Provider, MD  baclofen (LIORESAL) 20 MG tablet Take 40 mg by mouth 4 (four) times daily.   Yes Historical Provider, MD  bisacodyl (DULCOLAX) 10 MG suppository Place 10 mg rectally every other day as needed. constipation   Yes Historical Provider, MD  imipramine (TOFRANIL) 25 MG tablet Take 75-100 mg by mouth at bedtime.   Yes Historical Provider, MD  ketoconazole (NIZORAL) 2 % shampoo Apply 1 application topically 2 (two) times a week.    Yes Historical Provider, MD  loratadine (CLARITIN) 10 MG tablet Take 10 mg by mouth daily. At night   Yes Historical Provider, MD  mupirocin ointment (BACTROBAN) 2 % 1 application by Tracheal Tube route daily. Applies to trach-stoma site   Yes Historical Provider, MD  neomycin-polymyxin B (NEOSPORIN GU IRRIGANT) SOLN 30-40 mLs by GU irrigant route at bedtime. Instilled thru I&O catheter at bedtime-to prevent uti   Yes Historical Provider, MD  nitrofurantoin (MACRODANTIN) 50 MG capsule Take 50 mg by mouth at bedtime.   Yes Historical Provider, MD  phenazopyridine (PYRIDIUM) 97 MG tablet Take 97 mg by mouth 3 (three) times daily as needed for pain (pain   AZO).   Yes Historical Provider, MD  polyethylene glycol (MIRALAX / GLYCOLAX) packet Take 17 g by mouth daily.   Yes Historical Provider, MD  sertraline (ZOLOFT) 100 MG tablet Take 100 mg by mouth at bedtime.   Yes Historical Provider, MD  solifenacin (VESICARE) 10 MG tablet Take 10 mg by mouth daily. Every am   Yes Historical Provider, MD  sucralfate (CARAFATE) 1 GM/10ML suspension Take 1 g by mouth 3 (three) times daily.    Yes Historical Provider, MD  sulfamethoxazole-trimethoprim (BACTRIM DS,SEPTRA DS) 800-160 MG per tablet Take 1 tablet by mouth 2 (two) times daily. 04/28/14 05/05/14  Nada Boozer Albert, PA-C   BP 109/59  Pulse 95  Temp(Src) 98.7 F (37.1 C) (Oral)  Resp 16  SpO2 96%  LMP 04/22/2014 Physical Exam  Nursing note and vitals reviewed. Constitutional: She is oriented to person, place, and time. She appears well-developed and well-nourished. No distress.  HENT:  Head: Normocephalic and atraumatic.  Mouth/Throat: Oropharynx is clear and moist.  Eyes: Conjunctivae are normal.  Neck: Normal range of motion. Neck supple.  Cardiovascular: Normal rate, regular rhythm and normal heart sounds.   Pulmonary/Chest: Effort normal and breath sounds normal.  Abdominal: Soft. Normal appearance and bowel sounds are normal. She exhibits no  distension. There is tenderness in the suprapubic area. There is no rigidity, no rebound, no guarding and no CVA tenderness.  No peritoneal signs.  Musculoskeletal: Normal range of motion. She exhibits no edema.  Neurological: She is alert and oriented to person, place, and time.  Skin: Skin is warm and dry. She is not diaphoretic.  Psychiatric: She has a normal mood and affect. Her behavior is normal.    ED Course  Procedures (including critical care time) Labs Review Labs Reviewed  URINALYSIS, ROUTINE W REFLEX MICROSCOPIC - Abnormal; Notable for the following:    Color, Urine AMBER (*)    APPearance TURBID (*)    Hgb urine dipstick MODERATE (*)    Protein, ur 30 (*)    Leukocytes, UA LARGE (*)    All other components within normal limits  URINE MICROSCOPIC-ADD ON -  Abnormal; Notable for the following:    Bacteria, UA MANY (*)    All other components within normal limits  URINE CULTURE  CBC  BASIC METABOLIC PANEL    Imaging Review No results found.   EKG Interpretation None      MDM   Final diagnoses:  UTI (urinary tract infection)   Patient presenting with bladder spasms. She is well appearing and in no apparent distress. Afebrile, vital signs stable. No tachycardia on my exam. Patient reporting she is dehydrated. UA pending. Will give IV fluids. 12:33 AM Urine positive for infection. Urine culture pending. Patient reports she has been treated with Septra in the past with no problem. States many other medications bother her stomach. Patient states she is feeling much better after receiving IV fluids. Stable for discharge. Return precautions given. F/u with PCP. Patient states understanding of treatment care plan and is agreeable.   Trevor Mace, PA-C 04/28/14 [phone]

## 2014-04-28 MED ORDER — SULFAMETHOXAZOLE-TRIMETHOPRIM 800-160 MG PO TABS
1.0000 | ORAL_TABLET | Freq: Two times a day (BID) | ORAL | Status: AC
Start: 2014-04-28 — End: 2014-05-05

## 2014-04-28 NOTE — Discharge Instructions (Signed)
Take antibiotic to completion. Follow up with your primary care physician in 1 week.  Urinary Tract Infection Urinary tract infections (UTIs) can develop anywhere along your urinary tract. Your urinary tract is your body's drainage system for removing wastes and extra water. Your urinary tract includes two kidneys, two ureters, a bladder, and a urethra. Your kidneys are a pair of bean-shaped organs. Each kidney is about the size of your fist. They are located below your ribs, one on each side of your spine. CAUSES Infections are caused by microbes, which are microscopic organisms, including fungi, viruses, and bacteria. These organisms are so small that they can only be seen through a microscope. Bacteria are the microbes that most commonly cause UTIs. SYMPTOMS  Symptoms of UTIs may vary by age and gender of the patient and by the location of the infection. Symptoms in young women typically include a frequent and intense urge to urinate and a painful, burning feeling in the bladder or urethra during urination. Older women and men are more likely to be tired, shaky, and weak and have muscle aches and abdominal pain. A fever may mean the infection is in your kidneys. Other symptoms of a kidney infection include pain in your back or sides below the ribs, nausea, and vomiting. DIAGNOSIS To diagnose a UTI, your caregiver will ask you about your symptoms. Your caregiver also will ask to provide a urine sample. The urine sample will be tested for bacteria and white blood cells. White blood cells are made by your body to help fight infection. TREATMENT  Typically, UTIs can be treated with medication. Because most UTIs are caused by a bacterial infection, they usually can be treated with the use of antibiotics. The choice of antibiotic and length of treatment depend on your symptoms and the type of bacteria causing your infection. HOME CARE INSTRUCTIONS  If you were prescribed antibiotics, take them exactly as  your caregiver instructs you. Finish the medication even if you feel better after you have only taken some of the medication.  Drink enough water and fluids to keep your urine clear or pale yellow.  Avoid caffeine, tea, and carbonated beverages. They tend to irritate your bladder.  Empty your bladder often. Avoid holding urine for long periods of time.  Empty your bladder before and after sexual intercourse.  After a bowel movement, women should cleanse from front to back. Use each tissue only once. SEEK MEDICAL CARE IF:   You have back pain.  You develop a fever.  Your symptoms do not begin to resolve within 3 days. SEEK IMMEDIATE MEDICAL CARE IF:   You have severe back pain or lower abdominal pain.  You develop chills.  You have nausea or vomiting.  You have continued burning or discomfort with urination. MAKE SURE YOU:   Understand these instructions.  Will watch your condition.  Will get help right away if you are not doing well or get worse. Document Released: 08/16/2005 Document Revised: 05/07/2012 Document Reviewed: 12/15/2011 [facility] Patient Information 2014 [place], [place].

## 2014-04-28 NOTE — ED Provider Notes (Signed)
Medical screening examination/treatment/procedure(s) were performed by non-physician practitioner and as supervising physician I was immediately available for consultation/collaboration.   EKG Interpretation None       Raeford Razor, MD 04/28/14 [phone]

## 2014-04-28 NOTE — ED Notes (Signed)
[facility] called  

## 2014-04-30 LAB — URINE CULTURE

## 2014-05-03 ENCOUNTER — Telehealth (HOSPITAL_BASED_OUTPATIENT_CLINIC_OR_DEPARTMENT_OTHER): Payer: Self-pay | Admitting: Emergency Medicine

## 2014-05-03 NOTE — Telephone Encounter (Signed)
Post ED Visit - Positive Culture Follow-up  Culture report reviewed by antimicrobial stewardship pharmacist: []  Wes Dulaney, Pharm.D., BCPS [x]  Celedonio MiyamotoJeremy Frens, Pharm.D., BCPS []  Georgina PillionElizabeth Martin, Pharm.D., BCPS []  KopperstonMinh Pham, [address]Pharm[address]D., BCPS, AAHIVP []  Estella HuskMichelle Turner, Pharm.D., BCPS, AAHIVP []  Harvie JuniorNathan Cope, Pharm.D.  Positive urine culture Treated with Sulfa-Trimeth, organism sensitive to the same and no further patient follow-up is required at this time.  Marcelle OverlieHolland, Jenel LucksKylie 05/03/2014, 10:29 AM

## 2014-05-18 ENCOUNTER — Other Ambulatory Visit (HOSPITAL_COMMUNITY)
Admission: RE | Admit: 2014-05-18 | Discharge: 2014-05-18 | Disposition: A | Discharge status: Home / Self Care | Payer: Medicaid Other | Source: Ambulatory Visit | Attending: Obstetrics & Gynecology | Admitting: Obstetrics & Gynecology

## 2014-05-18 ENCOUNTER — Encounter: Payer: Self-pay | Admitting: Obstetrics & Gynecology

## 2014-05-18 ENCOUNTER — Ambulatory Visit (INDEPENDENT_AMBULATORY_CARE_PROVIDER_SITE_OTHER): Payer: Medicaid Other | Admitting: Obstetrics & Gynecology

## 2014-05-18 ENCOUNTER — Encounter: Payer: Medicaid Other | Admitting: Obstetrics & Gynecology

## 2014-05-18 VITALS — BP 121/77 | HR 87 | Temp 98.2°F

## 2014-05-18 DIAGNOSIS — G8250 Quadriplegia, unspecified: Secondary | ICD-10-CM | POA: Insufficient documentation

## 2014-05-18 DIAGNOSIS — Z01419 Encounter for gynecological examination (general) (routine) without abnormal findings: Secondary | ICD-10-CM

## 2014-05-18 DIAGNOSIS — Z0000 Encounter for general adult medical examination without abnormal findings: Secondary | ICD-10-CM

## 2014-05-18 DIAGNOSIS — I6350 Cerebral infarction due to unspecified occlusion or stenosis of unspecified cerebral artery: Secondary | ICD-10-CM | POA: Diagnosis not present

## 2014-05-18 DIAGNOSIS — I639 Cerebral infarction, unspecified: Secondary | ICD-10-CM | POA: Insufficient documentation

## 2014-05-18 NOTE — Progress Notes (Signed)
    GYNECOLOGY CLINIC ANNUAL PREVENTATIVE CARE ENCOUNTER NOTE  Subjective:     Veronica Huff is a 28 y.o. G0 quadriplegic female here for a routine annual gynecologic exam. Quadriplegia was a result of stroke at age 28; patient had other comorbidities and has undergone several procedures due to the stroke. Current complaints: none.  Wants to discuss birth control. She lives with her boyfriend, no problems with sexual activity. Accompanied by her boyfriend today.   Obstetric and Gynecologic History Patient's last menstrual period was 04/22/2014. Contraception: none Last Pap: never Never been pregnant  The following portions of the patient's history were reviewed and updated as appropriate: allergies, current medications, past family history, past medical history, past social history, past surgical history and problem list.  Review of Systems A comprehensive review of systems was negative.    Objective:   LMP 04/22/2014 GENERAL: Well-developed, well-nourished female in no acute distress.  HEENT: Normocephalic, atraumatic. Sclerae anicteric.  NECK: Supple. Normal thyroid. Trach stoma present. LUNGS: Clear to auscultation bilaterally.  HEART: Regular rate and rhythm. BREASTS: Symmetric in size. No masses, skin changes, nipple drainage, or lymphadenopathy. ABDOMEN: Soft, nontender, nondistended. No organomegaly. Suprapubic stoma noted. PELVIC: Normal external female genitalia. Vagina is pink and rugated.  Patient is on her period, bloody discharge noted. Normal cervix contour. Pap smear obtained. Uterus is normal in size. No adnexal mass or tenderness.  EXTREMITIES: No cyanosis, clubbing, or edema   Assessment:   Annual gynecologic examination Contraceptive counseling  Plan:   Pap done, will follow up results and manage accordingly. Contraceptive counseling done; emphasized progesterone only LARCs, she will discuss with her MDs and call if she wants these options. Routine  preventative health maintenance measures emphasized    Jaynie CollinsUGONNA  ANYANWU, MD, FACOG Attending Obstetrician & Gynecologist [facility], [facility]

## 2014-05-18 NOTE — Patient Instructions (Addendum)
Contraception Choices Contraception (birth control) is the use of any methods or devices to prevent pregnancy. Below are some methods to help avoid pregnancy. HORMONAL METHODS   Contraceptive implant (Nexplanon). This is a thin, plastic tube containing progesterone hormone. It does not contain estrogen hormone. Your health care Rico Massar inserts the tube in the inner part of the upper arm. The tube can remain in place for up to 3 years. After 3 years, the implant must be removed. The implant prevents the ovaries from releasing an egg (ovulation), thickens the cervical mucus to prevent sperm from entering the uterus, and thins the lining of the inside of the uterus.  Progesterone-only injections (Depo Provera). These injections are given every 3 months by your health care Wadsworth Skolnick to prevent pregnancy. This synthetic progesterone hormone stops the ovaries from releasing eggs. It also thickens cervical mucus and changes the uterine lining. This makes it harder for sperm to survive in the uterus.  Birth control pills. These pills contain estrogen and progesterone hormone. They work by preventing the ovaries from releasing eggs (ovulation). They also cause the cervical mucus to thicken, preventing the sperm from entering the uterus. Birth control pills are prescribed by a health care Warnie Belair.Birth control pills can also be used to treat heavy periods.  Minipill. This type of birth control pill contains only the progesterone hormone. They are taken every day of each month and must be prescribed by your health care Sanaya Gwilliam.  Birth control patch. The patch contains hormones similar to those in birth control pills. It must be changed once a week and is prescribed by a health care Mirielle Byrum.  Vaginal ring. The ring contains hormones similar to those in birth control pills. It is left in the vagina for 3 weeks, removed for 1 week, and then a new one is put back in place. The patient must be comfortable  inserting and removing the ring from the vagina.A health care Dredyn Gubbels's prescription is necessary.  Emergency contraception. Emergency contraceptives prevent pregnancy after unprotected sexual intercourse. This pill can be taken right after sex or up to 5 days after unprotected sex. It is most effective the sooner you take the pills after having sexual intercourse. Most emergency contraceptive pills are available without a prescription. Check with your pharmacist. Do not use emergency contraception as your only form of birth control. BARRIER METHODS   Female condom. This is a thin sheath (latex or rubber) that is worn over the penis during sexual intercourse. It can be used with spermicide to increase effectiveness.  Female condom. This is a soft, loose-fitting sheath that is put into the vagina before sexual intercourse.  Diaphragm. This is a soft, latex, dome-shaped barrier that must be fitted by a health care Mccauley Diehl. It is inserted into the vagina, along with a spermicidal jelly. It is inserted before intercourse. The diaphragm should be left in the vagina for 6 to 8 hours after intercourse.  Cervical cap. This is a round, soft, latex or plastic cup that fits over the cervix and must be fitted by a health care Lydiah Pong. The cap can be left in place for up to 48 hours after intercourse.  Sponge. This is a soft, circular piece of polyurethane foam. The sponge has spermicide in it. It is inserted into the vagina after wetting it and before sexual intercourse.  Spermicides. These are chemicals that kill or block sperm from entering the cervix and uterus. They come in the form of creams, jellies, suppositories, foam, or tablets. They do  not require a prescription. They are inserted into the vagina with an applicator before having sexual intercourse. The process must be repeated every time you have sexual intercourse. INTRAUTERINE CONTRACEPTION  Intrauterine device (IUD). This is a T-shaped device  that is put in a woman's uterus during a menstrual period to prevent pregnancy. There are 2 types:  Copper IUD. This type of IUD is wrapped in copper wire and is placed inside the uterus. Copper makes the uterus and fallopian tubes produce a fluid that kills sperm. It can stay in place for 10 years.  Hormone IUD. This type of IUD contains the hormone progestin (synthetic progesterone). The hormone thickens the cervical mucus and prevents sperm from entering the uterus, and it also thins the uterine lining to prevent implantation of a fertilized egg. The hormone can weaken or kill the sperm that get into the uterus. It can stay in place for 3-5 years, depending on which type of IUD is used. PERMANENT METHODS OF CONTRACEPTION  Female tubal ligation. This is when the woman's fallopian tubes are surgically sealed, tied, or blocked to prevent the egg from traveling to the uterus.  Hysteroscopic sterilization. This involves placing a small coil or insert into each fallopian tube. Your doctor uses a technique called hysteroscopy to do the procedure. The device causes scar tissue to form. This results in permanent blockage of the fallopian tubes, so the sperm cannot fertilize the egg. It takes about 3 months after the procedure for the tubes to become blocked. You must use another form of birth control for these 3 months.  Female sterilization. This is when the female has the tubes that carry sperm tied off (vasectomy).This blocks sperm from entering the vagina during sexual intercourse. After the procedure, the man can still ejaculate fluid (semen). NATURAL PLANNING METHODS  Natural family planning. This is not having sexual intercourse or using a barrier method (condom, diaphragm, cervical cap) on days the woman could become pregnant.  Calendar method. This is keeping track of the length of each menstrual cycle and identifying when you are fertile.  Ovulation method. This is avoiding sexual intercourse  during ovulation.  Symptothermal method. This is avoiding sexual intercourse during ovulation, using a thermometer and ovulation symptoms.  Post-ovulation method. This is timing sexual intercourse after you have ovulated. Regardless of which type or method of contraception you choose, it is important that you use condoms to protect against the transmission of sexually transmitted infections (STIs). Talk with your health care Seiya Silsby about which form of contraception is most appropriate for you. Document Released: 11/06/2005 Document Revised: 11/11/2013 Document Reviewed: 05/01/2013 [facility] Patient Information 2015 [place], [place]. This information is not intended to replace advice given to you by your health care Lundy Cozart. Make sure you discuss any questions you have with your health care Espen Bethel.  Preventive Care for Adults A healthy lifestyle and preventive care can promote health and wellness. Preventive health guidelines for women include the following key practices.  A routine yearly physical is a good way to check with your health care Cher Egnor about your health and preventive screening. It is a chance to share any concerns and updates on your health and to receive a thorough exam.  Visit your dentist for a routine exam and preventive care every 6 months. Brush your teeth twice a day and floss once a day. Good oral hygiene prevents tooth decay and gum disease.  The frequency of eye exams is based on your age, health, family medical history, use of  contact lenses, and other factors. Follow your health care Dary Dilauro's recommendations for frequency of eye exams.  Eat a healthy diet. Foods like vegetables, fruits, whole grains, low-fat dairy products, and lean protein foods contain the nutrients you need without too many calories. Decrease your intake of foods high in solid fats, added sugars, and salt. Eat the right amount of calories for you.Get information about a proper diet from your  health care Deazia Lampi, if necessary.  Regular physical exercise is one of the most important things you can do for your health. Most adults should get at least 150 minutes of moderate-intensity exercise (any activity that increases your heart rate and causes you to sweat) each week. In addition, most adults need muscle-strengthening exercises on 2 or more days a week.  Maintain a healthy weight. The body mass index (BMI) is a screening tool to identify possible weight problems. It provides an estimate of body fat based on height and weight. Your health care Emylia Latella can find your BMI, and can help you achieve or maintain a healthy weight.For adults 20 years and older:  A BMI below 18.5 is considered underweight.  A BMI of 18.5 to 24.9 is normal.  A BMI of 25 to 29.9 is considered overweight.  A BMI of 30 and above is considered obese.  Maintain normal blood lipids and cholesterol levels by exercising and minimizing your intake of saturated fat. Eat a balanced diet with plenty of fruit and vegetables. Blood tests for lipids and cholesterol should begin at age 70 and be repeated every 5 years. If your lipid or cholesterol levels are high, you are over 50, or you are at high risk for heart disease, you may need your cholesterol levels checked more frequently.Ongoing high lipid and cholesterol levels should be treated with medicines if diet and exercise are not working.  If you smoke, find out from your health care Gardenia Witter how to quit. If you do not use tobacco, do not start.  Lung cancer screening is recommended for adults aged 16-80 years who are at high risk for developing lung cancer because of a history of smoking. A yearly low-dose CT scan of the lungs is recommended for people who have at least a 30-pack-year history of smoking and are a current smoker or have quit within the past 15 years. A pack year of smoking is smoking an average of 1 pack of cigarettes a day for 1 year (for example: 1  pack a day for 30 years or 2 packs a day for 15 years). Yearly screening should continue until the smoker has stopped smoking for at least 15 years. Yearly screening should be stopped for people who develop a health problem that would prevent them from having lung cancer treatment.  If you are pregnant, do not drink alcohol. If you are breastfeeding, be very cautious about drinking alcohol. If you are not pregnant and choose to drink alcohol, do not have more than 1 drink per day. One drink is considered to be 12 ounces (355 mL) of beer, 5 ounces (148 mL) of wine, or 1.5 ounces (44 mL) of liquor.  Avoid use of street drugs. Do not share needles with anyone. Ask for help if you need support or instructions about stopping the use of drugs.  High blood pressure causes heart disease and increases the risk of stroke. Your blood pressure should be checked at least every 1 to 2 years. Ongoing high blood pressure should be treated with medicines if weight loss and  exercise do not work.  If you are 14-67 years old, ask your health care Cleveland Paiz if you should take aspirin to prevent strokes.  Diabetes screening involves taking a blood sample to check your fasting blood sugar level. This should be done once every 3 years, after age 57, if you are within normal weight and without risk factors for diabetes. Testing should be considered at a younger age or be carried out more frequently if you are overweight and have at least 1 risk factor for diabetes.  Breast cancer screening is essential preventive care for women. You should practice "breast self-awareness." This means understanding the normal appearance and feel of your breasts and may include breast self-examination. Any changes detected, no matter how small, should be reported to a health care Kasidy Gianino. Women in their 66s and 30s should have a clinical breast exam (CBE) by a health care Alessander Sikorski as part of a regular health exam every 1 to 3 years. After age 75,  women should have a CBE every year. Starting at age 31, women should consider having a mammogram (breast X-ray test) every year. Women who have a family history of breast cancer should talk to their health care Cynthea Zachman about genetic screening. Women at a high risk of breast cancer should talk to their health care providers about having an MRI and a mammogram every year.  Breast cancer gene (BRCA)-related cancer risk assessment is recommended for women who have family members with BRCA-related cancers. BRCA-related cancers include breast, ovarian, tubal, and peritoneal cancers. Having family members with these cancers may be associated with an increased risk for harmful changes (mutations) in the breast cancer genes BRCA1 and BRCA2. Results of the assessment will determine the need for genetic counseling and BRCA1 and BRCA2 testing.  Routine pelvic exams to screen for cancer are no longer recommended for nonpregnant women who are considered low risk for cancer of the pelvic organs (ovaries, uterus, and vagina) and who do not have symptoms. Ask your health care Arlington Sigmund if a screening pelvic exam is right for you.  If you have had past treatment for cervical cancer or a condition that could lead to cancer, you need Pap tests and screening for cancer for at least 20 years after your treatment. If Pap tests have been discontinued, your risk factors (such as having a new sexual partner) need to be reassessed to determine if screening should be resumed. Some women have medical problems that increase the chance of getting cervical cancer. In these cases, your health care Keymarion Bearman may recommend more frequent screening and Pap tests.  The HPV test is an additional test that may be used for cervical cancer screening. The HPV test looks for the virus that can cause the cell changes on the cervix. The cells collected during the Pap test can be tested for HPV. The HPV test could be used to screen women aged 14 years and  older, and should be used in women of any age who have unclear Pap test results. After the age of 54, women should have HPV testing at the same frequency as a Pap test.  Colorectal cancer can be detected and often prevented. Most routine colorectal cancer screening begins at the age of 41 years and continues through age 47 years. However, your health care Johnrobert Foti may recommend screening at an earlier age if you have risk factors for colon cancer. On a yearly basis, your health care Karim Aiello may provide home test kits to check for hidden blood in  the stool. Use of a small camera at the end of a tube, to directly examine the colon (sigmoidoscopy or colonoscopy), can detect the earliest forms of colorectal cancer. Talk to your health care Hodan Wurtz about this at age 40, when routine screening begins. Direct exam of the colon should be repeated every 5-10 years through age 49 years, unless early forms of pre-cancerous polyps or small growths are found.  People who are at an increased risk for hepatitis B should be screened for this virus. You are considered at high risk for hepatitis B if:  You were born in a country where hepatitis B occurs often. Talk with your health care Norrine Ballester about which countries are considered high risk.  Your parents were born in a high-risk country and you have not received a shot to protect against hepatitis B (hepatitis B vaccine).  You have HIV or AIDS.  You use needles to inject street drugs.  You live with, or have sex with, someone who has Hepatitis B.  You get hemodialysis treatment.  You take certain medicines for conditions like cancer, organ transplantation, and autoimmune conditions.  Hepatitis C blood testing is recommended for all people born from [birth year] through 1964 and any individual with known risks for hepatitis C.  Practice safe sex. Use condoms and avoid high-risk sexual practices to reduce the spread of sexually transmitted infections (STIs). STIs  include gonorrhea, chlamydia, syphilis, trichomonas, herpes, HPV, and human immunodeficiency virus (HIV). Herpes, HIV, and HPV are viral illnesses that have no cure. They can result in disability, cancer, and death.  You should be screened for sexually transmitted illnesses (STIs) including gonorrhea and chlamydia if:  You are sexually active and are younger than 24 years.  You are older than 24 years and your health care Henning Ehle tells you that you are at risk for this type of infection.  Your sexual activity has changed since you were last screened and you are at an increased risk for chlamydia or gonorrhea. Ask your health care Madissen Wyse if you are at risk.  If you are at risk of being infected with HIV, it is recommended that you take a prescription medicine daily to prevent HIV infection. This is called preexposure prophylaxis (PrEP). You are considered at risk if:  You are a heterosexual woman, are sexually active, and are at increased risk for HIV infection.  You take drugs by injection.  You are sexually active with a partner who has HIV.  Talk with your health care Maimuna Leaman about whether you are at high risk of being infected with HIV. If you choose to begin PrEP, you should first be tested for HIV. You should then be tested every 3 months for as long as you are taking PrEP.  Osteoporosis is a disease in which the bones lose minerals and strength with aging. This can result in serious bone fractures or breaks. The risk of osteoporosis can be identified using a bone density scan. Women ages 43 years and over and women at risk for fractures or osteoporosis should discuss screening with their health care providers. Ask your health care Selin Eisler whether you should take a calcium supplement or vitamin D to reduce the rate of osteoporosis.  Menopause can be associated with physical symptoms and risks. Hormone replacement therapy is available to decrease symptoms and risks. You should talk to  your health care Fleurette Woolbright about whether hormone replacement therapy is right for you.  Use sunscreen. Apply sunscreen liberally and repeatedly throughout the day. You should  seek shade when your shadow is shorter than you. Protect yourself by wearing long sleeves, pants, a wide-brimmed hat, and sunglasses year round, whenever you are outdoors.  Once a month, do a whole body skin exam, using a mirror to look at the skin on your back. Tell your health care Cedra Villalon of new moles, moles that have irregular borders, moles that are larger than a pencil eraser, or moles that have changed in shape or color.  Stay current with required vaccines (immunizations).  Influenza vaccine. All adults should be immunized every year.  Tetanus, diphtheria, and acellular pertussis (Td, Tdap) vaccine. Pregnant women should receive 1 dose of Tdap vaccine during each pregnancy. The dose should be obtained regardless of the length of time since the last dose. Immunization is preferred during the 27th-36th week of gestation. An adult who has not previously received Tdap or who does not know her vaccine status should receive 1 dose of Tdap. This initial dose should be followed by tetanus and diphtheria toxoids (Td) booster doses every 10 years. Adults with an unknown or incomplete history of completing a 3-dose immunization series with Td-containing vaccines should begin or complete a primary immunization series including a Tdap dose. Adults should receive a Td booster every 10 years.  Varicella vaccine. An adult without evidence of immunity to varicella should receive 2 doses or a second dose if she has previously received 1 dose. Pregnant females who do not have evidence of immunity should receive the first dose after pregnancy. This first dose should be obtained before leaving the health care facility. The second dose should be obtained 4-8 weeks after the first dose.  Human papillomavirus (HPV) vaccine. Females aged 13-26  years who have not received the vaccine previously should obtain the 3-dose series. The vaccine is not recommended for use in pregnant females. However, pregnancy testing is not needed before receiving a dose. If a female is found to be pregnant after receiving a dose, no treatment is needed. In that case, the remaining doses should be delayed until after the pregnancy. Immunization is recommended for any person with an immunocompromised condition through the age of 52 years if she did not get any or all doses earlier. During the 3-dose series, the second dose should be obtained 4-8 weeks after the first dose. The third dose should be obtained 24 weeks after the first dose and 16 weeks after the second dose.  Zoster vaccine. One dose is recommended for adults aged 28 years or older unless certain conditions are present.  Measles, mumps, and rubella (MMR) vaccine. Adults born before [birth year] generally are considered immune to measles and mumps. Adults born in [birth year] or later should have 1 or more doses of MMR vaccine unless there is a contraindication to the vaccine or there is laboratory evidence of immunity to each of the three diseases. A routine second dose of MMR vaccine should be obtained at least 28 days after the first dose for students attending postsecondary schools, health care workers, or international travelers. People who received inactivated measles vaccine or an unknown type of measles vaccine during 1963-1967 should receive 2 doses of MMR vaccine. People who received inactivated mumps vaccine or an unknown type of mumps vaccine before 1979 and are at high risk for mumps infection should consider immunization with 2 doses of MMR vaccine. For females of childbearing age, rubella immunity should be determined. If there is no evidence of immunity, females who are not pregnant should be vaccinated. If there is no  evidence of immunity, females who are pregnant should delay immunization until after pregnancy.  Unvaccinated health care workers born before [birth year] who lack laboratory evidence of measles, mumps, or rubella immunity or laboratory confirmation of disease should consider measles and mumps immunization with 2 doses of MMR vaccine or rubella immunization with 1 dose of MMR vaccine.  Pneumococcal 13-valent conjugate (PCV13) vaccine. When indicated, a person who is uncertain of her immunization history and has no record of immunization should receive the PCV13 vaccine. An adult aged 40 years or older who has certain medical conditions and has not been previously immunized should receive 1 dose of PCV13 vaccine. This PCV13 should be followed with a dose of pneumococcal polysaccharide (PPSV23) vaccine. The PPSV23 vaccine dose should be obtained at least 8 weeks after the dose of PCV13 vaccine. An adult aged 58 years or older who has certain medical conditions and previously received 1 or more doses of PPSV23 vaccine should receive 1 dose of PCV13. The PCV13 vaccine dose should be obtained 1 or more years after the last PPSV23 vaccine dose.  Pneumococcal polysaccharide (PPSV23) vaccine. When PCV13 is also indicated, PCV13 should be obtained first. All adults aged 45 years and older should be immunized. An adult younger than age 41 years who has certain medical conditions should be immunized. Any person who resides in a nursing home or long-term care facility should be immunized. An adult smoker should be immunized. People with an immunocompromised condition and certain other conditions should receive both PCV13 and PPSV23 vaccines. People with human immunodeficiency virus (HIV) infection should be immunized as soon as possible after diagnosis. Immunization during chemotherapy or radiation therapy should be avoided. Routine use of PPSV23 vaccine is not recommended for American Indians, [place] Natives, or people younger than 65 years unless there are medical conditions that require PPSV23 vaccine. When indicated,  people who have unknown immunization and have no record of immunization should receive PPSV23 vaccine. One-time revaccination 5 years after the first dose of PPSV23 is recommended for people aged 19-64 years who have chronic kidney failure, nephrotic syndrome, asplenia, or immunocompromised conditions. People who received 1-2 doses of PPSV23 before age 42 years should receive another dose of PPSV23 vaccine at age 73 years or later if at least 5 years have passed since the previous dose. Doses of PPSV23 are not needed for people immunized with PPSV23 at or after age 47 years.  Meningococcal vaccine. Adults with asplenia or persistent complement component deficiencies should receive 2 doses of quadrivalent meningococcal conjugate (MenACWY-D) vaccine. The doses should be obtained at least 2 months apart. Microbiologists working with certain meningococcal bacteria, [employer], people at risk during an outbreak, and people who travel to or live in countries with a high rate of meningitis should be immunized. A first-year college student up through age 77 years who is living in a residence hall should receive a dose if she did not receive a dose on or after her 16th birthday. Adults who have certain high-risk conditions should receive one or more doses of vaccine.  Hepatitis A vaccine. Adults who wish to be protected from this disease, have certain high-risk conditions, work with hepatitis A-infected animals, work in hepatitis A research labs, or travel to or work in countries with a high rate of hepatitis A should be immunized. Adults who were previously unvaccinated and who anticipate close contact with an international adoptee during the first 60 days after arrival in the [place] from a country with a high rate  of hepatitis A should be immunized.  Hepatitis B vaccine. Adults who wish to be protected from this disease, have certain high-risk conditions, may be exposed to blood or other infectious body  fluids, are household contacts or sex partners of hepatitis B positive people, are clients or workers in certain care facilities, or travel to or work in countries with a high rate of hepatitis B should be immunized.  Haemophilus influenzae type b (Hib) vaccine. A previously unvaccinated person with asplenia or sickle cell disease or having a scheduled splenectomy should receive 1 dose of Hib vaccine. Regardless of previous immunization, a recipient of a hematopoietic stem cell transplant should receive a 3-dose series 6-12 months after her successful transplant. Hib vaccine is not recommended for adults with HIV infection. Preventive Services / Frequency Ages 69 to 39years  Blood pressure check.** / Every 1 to 2 years.  Lipid and cholesterol check.** / Every 5 years beginning at age 62.  Clinical breast exam.** / Every 3 years for women in their 71s and 30s.  BRCA-related cancer risk assessment.** / For women who have family members with a BRCA-related cancer (breast, ovarian, tubal, or peritoneal cancers).  Pap test.** / Every 2 years from ages 46 through 62. Every 3 years starting at age 62 through age 67 or 72 with a history of 3 consecutive normal Pap tests.  HPV screening.** / Every 3 years from ages 5 through ages 63 to 76 with a history of 3 consecutive normal Pap tests.  Hepatitis C blood test.** / For any individual with known risks for hepatitis C.  Skin self-exam. / Monthly.  Influenza vaccine. / Every year.  Tetanus, diphtheria, and acellular pertussis (Tdap, Td) vaccine.** / Consult your health care Ericson Nafziger. Pregnant women should receive 1 dose of Tdap vaccine during each pregnancy. 1 dose of Td every 10 years.  Varicella vaccine.** / Consult your health care Navpreet Szczygiel. Pregnant females who do not have evidence of immunity should receive the first dose after pregnancy.  HPV vaccine. / 3 doses over 6 months, if 22 and younger. The vaccine is not recommended for use in  pregnant females. However, pregnancy testing is not needed before receiving a dose.  Measles, mumps, rubella (MMR) vaccine.** / You need at least 1 dose of MMR if you were born in 1957 or later. You may also need a 2nd dose. For females of childbearing age, rubella immunity should be determined. If there is no evidence of immunity, females who are not pregnant should be vaccinated. If there is no evidence of immunity, females who are pregnant should delay immunization until after pregnancy.  Pneumococcal 13-valent conjugate (PCV13) vaccine.** / Consult your health care Benzion Mesta.  Pneumococcal polysaccharide (PPSV23) vaccine.** / 1 to 2 doses if you smoke cigarettes or if you have certain conditions.  Meningococcal vaccine.** / 1 dose if you are age 86 to 58 years and a Market researcher living in a residence hall, or have one of several medical conditions, you need to get vaccinated against meningococcal disease. You may also need additional booster doses.  Hepatitis A vaccine.** / Consult your health care Chanel Mcadams.  Hepatitis B vaccine.** / Consult your health care Makinlee Awwad.  Haemophilus influenzae type b (Hib) vaccine.** / Consult your health care Saint Hank. Ages 24 to 64years  Blood pressure check.** / Every 1 to 2 years.  Lipid and cholesterol check.** / Every 5 years beginning at age 48 years.  Lung cancer screening. / Every year if you are aged 72-80 years  and have a 30-pack-year history of smoking and currently smoke or have quit within the past 15 years. Yearly screening is stopped once you have quit smoking for at least 15 years or develop a health problem that would prevent you from having lung cancer treatment.  Clinical breast exam.** / Every year after age 10 years.  BRCA-related cancer risk assessment.** / For women who have family members with a BRCA-related cancer (breast, ovarian, tubal, or peritoneal cancers).  Mammogram.** / Every year beginning at age 62 years  and continuing for as long as you are in good health. Consult with your health care Sasuke Yaffe.  Pap test.** / Every 3 years starting at age 30 years through age 39 or 47 years with a history of 3 consecutive normal Pap tests.  HPV screening.** / Every 3 years from ages 60 years through ages 58 to 12 years with a history of 3 consecutive normal Pap tests.  Fecal occult blood test (FOBT) of stool. / Every year beginning at age 69 years and continuing until age 86 years. You may not need to do this test if you get a colonoscopy every 10 years.  Flexible sigmoidoscopy or colonoscopy.** / Every 5 years for a flexible sigmoidoscopy or every 10 years for a colonoscopy beginning at age 69 years and continuing until age 48 years.  Hepatitis C blood test.** / For all people born from [birth year] through 1964 and any individual with known risks for hepatitis C.  Skin self-exam. / Monthly.  Influenza vaccine. / Every year.  Tetanus, diphtheria, and acellular pertussis (Tdap/Td) vaccine.** / Consult your health care Iola Turri. Pregnant women should receive 1 dose of Tdap vaccine during each pregnancy. 1 dose of Td every 10 years.  Varicella vaccine.** / Consult your health care Sulema Braid. Pregnant females who do not have evidence of immunity should receive the first dose after pregnancy.  Zoster vaccine.** / 1 dose for adults aged 50 years or older.  Measles, mumps, rubella (MMR) vaccine.** / You need at least 1 dose of MMR if you were born in 1957 or later. You may also need a 2nd dose. For females of childbearing age, rubella immunity should be determined. If there is no evidence of immunity, females who are not pregnant should be vaccinated. If there is no evidence of immunity, females who are pregnant should delay immunization until after pregnancy.  Pneumococcal 13-valent conjugate (PCV13) vaccine.** / Consult your health care Kamalei Roeder.  Pneumococcal polysaccharide (PPSV23) vaccine.** / 1 to 2 doses if you  smoke cigarettes or if you have certain conditions.  Meningococcal vaccine.** / Consult your health care Rossetta Kama.  Hepatitis A vaccine.** / Consult your health care Kirk Basquez.  Hepatitis B vaccine.** / Consult your health care Veda Arrellano.  Haemophilus influenzae type b (Hib) vaccine.** / Consult your health care Daran Favaro. Ages 39 years and over  Blood pressure check.** / Every 1 to 2 years.  Lipid and cholesterol check.** / Every 5 years beginning at age 52 years.  Lung cancer screening. / Every year if you are aged 48-80 years and have a 30-pack-year history of smoking and currently smoke or have quit within the past 15 years. Yearly screening is stopped once you have quit smoking for at least 15 years or develop a health problem that would prevent you from having lung cancer treatment.  Clinical breast exam.** / Every year after age 29 years.  BRCA-related cancer risk assessment.** / For women who have family members with a BRCA-related cancer (breast, ovarian, tubal, or  peritoneal cancers).  Mammogram.** / Every year beginning at age 19 years and continuing for as long as you are in good health. Consult with your health care Levone Otten.  Pap test.** / Every 3 years starting at age 66 years through age 76 or 56 years with 3 consecutive normal Pap tests. Testing can be stopped between 65 and 70 years with 3 consecutive normal Pap tests and no abnormal Pap or HPV tests in the past 10 years.  HPV screening.** / Every 3 years from ages 57 years through ages 41 or 5 years with a history of 3 consecutive normal Pap tests. Testing can be stopped between 65 and 70 years with 3 consecutive normal Pap tests and no abnormal Pap or HPV tests in the past 10 years.  Fecal occult blood test (FOBT) of stool. / Every year beginning at age 38 years and continuing until age 12 years. You may not need to do this test if you get a colonoscopy every 10 years.  Flexible sigmoidoscopy or colonoscopy.** / Every  5 years for a flexible sigmoidoscopy or every 10 years for a colonoscopy beginning at age 38 years and continuing until age 83 years.  Hepatitis C blood test.** / For all people born from [birth year] through 1964 and any individual with known risks for hepatitis C.  Osteoporosis screening.** / A one-time screening for women ages 24 years and over and women at risk for fractures or osteoporosis.  Skin self-exam. / Monthly.  Influenza vaccine. / Every year.  Tetanus, diphtheria, and acellular pertussis (Tdap/Td) vaccine.** / 1 dose of Td every 10 years.  Varicella vaccine.** / Consult your health care Roylene Heaton.  Zoster vaccine.** / 1 dose for adults aged 31 years or older.  Pneumococcal 13-valent conjugate (PCV13) vaccine.** / Consult your health care Kailiana Granquist.  Pneumococcal polysaccharide (PPSV23) vaccine.** / 1 dose for all adults aged 17 years and older.  Meningococcal vaccine.** / Consult your health care Miaya Lafontant.  Hepatitis A vaccine.** / Consult your health care Julene Rahn.  Hepatitis B vaccine.** / Consult your health care Albertha Beattie.  Haemophilus influenzae type b (Hib) vaccine.** / Consult your health care Hatcher Froning. ** Family history and personal history of risk and conditions may change your health care Alontae Chaloux's recommendations. Document Released: 01/02/2002 Document Revised: 11/11/2013 Document Reviewed: 04/03/2011 [facility] Patient Information 2015 [place], [place]. This information is not intended to replace advice given to you by your health care Mariya Mottley. Make sure you discuss any questions you have with your health care Letishia Elliott.

## 2014-05-21 LAB — CYTOLOGY - PAP

## 2014-11-03 ENCOUNTER — Encounter: Payer: Self-pay | Admitting: Vascular Surgery

## 2014-11-26 ENCOUNTER — Encounter: Payer: Medicaid Other | Admitting: Vascular Surgery

## 2014-12-23 ENCOUNTER — Encounter: Payer: Self-pay | Admitting: Vascular Surgery

## 2014-12-24 ENCOUNTER — Ambulatory Visit (INDEPENDENT_AMBULATORY_CARE_PROVIDER_SITE_OTHER): Payer: Medicaid Other | Admitting: Vascular Surgery

## 2014-12-24 ENCOUNTER — Encounter: Payer: Self-pay | Admitting: Vascular Surgery

## 2014-12-24 VITALS — BP 113/69 | HR 89 | Temp 97.8°F | Resp 24 | Ht 62.0 in | Wt 122.0 lb

## 2014-12-24 DIAGNOSIS — I872 Venous insufficiency (chronic) (peripheral): Secondary | ICD-10-CM

## 2014-12-24 NOTE — Addendum Note (Signed)
Addended by: Sharee PimpleMCCHESNEY, Aquarius Tremper K on: 12/24/2014 04:10 PM   Modules accepted: Orders

## 2014-12-24 NOTE — Progress Notes (Signed)
[facility] [place] HISTORY AND PHYSICAL   History of Present Illness:  Patient is a 29 y.o. year old female who presents for evaluation of bluish discoloration of feet. 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 Over the last 1-2 years she has noticed that her feet have more bluish discoloration at times. She states she occasionally gets this in her hands. She does have intact sensation. She denies any pain in the feet or hands. She denies any numbness or tingling. She is completely quadriplegic with only some flicker in the toes and the left foot. She is on a ventilator at night time. She has a tracheostomy. She also has a history of chronic intermittent swelling in both lower extremities. She denies any acute change in swelling. She did apparently have one prior left heel decubitus ulcer which healed spontaneously. Other medical problems include recurrent UTIs, sleep apnea, depression, headaches all of which are currently stable.  Past Medical History  Diagnosis Date  . 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  . Quadriplegia     Secondary to stroke at age 58-has trach-lives with boyfriend-motorized w/c-joystick to chin to drive w/c--I & O cath thru suprapubic stoma - has nursing care 16 hrs daily and boyfriend able to help pt the other hours of day.  . Recurrent UTI (urinary tract infection)     Due to suprapubic stoma, repetitive catheterizations  . Sleep apnea     On ventilator at night -has oxygen but rarely needs unless she has a cold and sats drop  . Depression     Zoloft has helped-not having any current depression  . Edema extremities     Feet and legs-mostly in feet  . Headache(784.0)     Prior to imipramine-none currently    Past Surgical History  Procedure Laterality Date  . Ileostomy  2006  . Esophagogastroduodenoscopy    . Spine surgery  2004,2011  .  Tracheostomy  2000  . Gastrostomy tube placement  2000  . Bronchoscopy  2006  . Throat surgery  2004  . Wisdom tooth extraction  2008  . Bladder surgery  2009,2012  . Tendon repair  2012    Left wrist  . Removal of gastrostomy tube    . Cholecystectomy  11/18/2012    Procedure: LAPAROSCOPIC CHOLECYSTECTOMY;  Surgeon: Axel Filler, MD;  Location: [facility];  Service: General;  Laterality: N/A;    Social History History  Substance Use Topics  . Smoking status: Never Smoker   . Smokeless tobacco: Not on file  . Alcohol Use: No    Family History Family History  Problem Relation Age of Onset  . Diabetes Mother   . Hypertension Mother   . Varicose Veins Mother   . Cancer Maternal Aunt   . Diabetes Maternal Grandfather   . Heart disease Maternal Grandfather   . Heart disease Paternal Grandfather     Allergies  Allergies  Allergen Reactions  . Bee Venom Shortness Of Breath and Swelling    Anaphylactic shock  . Ciprofloxacin     hives  . Erythromycin   . Morphine And Related Other (See Comments)    Goes crazy  . Tobramycin   . Trazodone And Nefazodone      Current Outpatient Prescriptions  Medication Sig Dispense Refill  . acetaminophen (TYLENOL) 500 MG tablet Take 500 mg by mouth every 6 (six)  hours as needed (pain).    . baclofen (LIORESAL) 20 MG tablet Take 40 mg by mouth 4 (four) times daily.    . bisacodyl (DULCOLAX) 10 MG suppository Place 10 mg rectally every other day as needed. constipation    . imipramine (TOFRANIL) 25 MG tablet Take 75-100 mg by mouth at bedtime.    Marland Kitchen ketoconazole (NIZORAL) 2 % shampoo Apply 1 application topically 2 (two) times a week.    . Multiple Vitamin (MULTIVITAMIN) tablet Take 1 tablet by mouth daily.    . mupirocin ointment (BACTROBAN) 2 % 1 application by Tracheal Tube route daily. Applies to trach-stoma site    . neomycin-polymyxin B (NEOSPORIN GU IRRIGANT) SOLN 30-40 mLs by GU irrigant route at bedtime. Instilled thru I&O  catheter at bedtime-to prevent uti    . nitrofurantoin (MACRODANTIN) 50 MG capsule Take 50 mg by mouth at bedtime.    . phenazopyridine (PYRIDIUM) 97 MG tablet Take 97 mg by mouth 3 (three) times daily as needed for pain (pain   AZO).    . polyethylene glycol (MIRALAX / GLYCOLAX) packet Take 17 g by mouth daily.    . sertraline (ZOLOFT) 100 MG tablet Take 100 mg by mouth at bedtime.    . solifenacin (VESICARE) 10 MG tablet Take 10 mg by mouth daily. Every am    . sucralfate (CARAFATE) 1 GM/10ML suspension Take 1 g by mouth 3 (three) times daily.     Marland Kitchen loratadine (CLARITIN) 10 MG tablet Take 10 mg by mouth daily. At night     No current facility-administered medications for this visit.    ROS:   General:  No weight loss, Fever, chills  HEENT: No recent headaches, no nasal bleeding, no visual changes, no sore throat  Neurologic: No dizziness, blackouts, seizures. No recent symptoms of stroke or mini- stroke. No recent episodes of slurred speech, or temporary blindness.  Cardiac: No recent episodes of chest pain/pressure, no shortness of breath at rest.  No shortness of breath with exertion.  Denies history of atrial fibrillation or irregular heartbeat  Vascular: No history of rest pain in feet.  No history of claudication.  No history of non-healing ulcer, No history of DVT   Pulmonary: No home oxygen, no productive cough, no hemoptysis,  No asthma or wheezing  Musculoskeletal:   Arthritis,  Low back pain,   Joint pain  Hematologic:No history of hypercoagulable state.  No history of easy bleeding.  No history of anemia  Gastrointestinal: No hematochezia or melena,  No gastroesophageal reflux, no trouble swallowing  Urinary:  chronic Kidney disease,  on HD -  MWF or  TTHS,  Burning with urination,  Frequent urination,  Difficulty urinating;   Skin: No rashes  Psychological: No history of anxiety,  No history of depression   Physical  Examination  Filed Vitals:   12/24/14 1216  BP: 113/69  Pulse: 89  Temp: 97.8 F (36.6 C)  Resp: 24  Height:  (1.575 m)  Weight: 122 lb (55.339 kg)    Body mass index is 22.31 kg/(m^2).  General:  Alert and oriented, no acute distress HEENT: Normal Neck: No bruit or JVD, tracheostomy capped Pulmonary: Clear to auscultation bilaterally Cardiac: Regular Rate and Rhythm  Skin: No rash, feet and dusky bilaterally Extremity Pulses:  2+ radial, brachial, femoral, dorsalis pedis pulses bilaterally Musculoskeletal: No deformity trace edema bilateral lower extremities  Neurologic: Upper and lower extremity motor 0/5 and  symmetric   ASSESSMENT:  Patient with swelling dusky discolored lower extremities and occasionally upper extremities. She has easily palpable pedal and radial pulses. Doubt this is arterial in nature. Most likely this represents decreased vasomotor tone from autonomic insufficiency. She may also have some component of venous reflux. No history suggestive of DVT.   PLAN:  Patient was prescribed bilateral lower extremity compression stockings 20-30 mmHg knee-high. She will wear these as much as possible to prevent pooling of blood in her lower extremities. She will follow-up on as-needed basis.  Fabienne Brunsharles Octavis Sheeler, MD [facility] [place] Office: [phone] Pager: [phone]

## 2015-02-08 ENCOUNTER — Ambulatory Visit: Payer: Medicaid Other | Attending: Internal Medicine

## 2015-02-08 DIAGNOSIS — M25671 Stiffness of right ankle, not elsewhere classified: Secondary | ICD-10-CM | POA: Diagnosis not present

## 2015-02-08 DIAGNOSIS — M25672 Stiffness of left ankle, not elsewhere classified: Secondary | ICD-10-CM

## 2015-02-08 DIAGNOSIS — M6289 Other specified disorders of muscle: Secondary | ICD-10-CM | POA: Diagnosis not present

## 2015-02-08 NOTE — Therapy (Addendum)
[facility] [address] [place], [place], [address] Phone: [phone]   Fax:  [phone]  Physical Therapy Evaluation  Patient Details  Name: Veronica Huff MRN: [id number] Date of Birth: 05-28-1986 Referring Provider:  Fleet ContrasAvbuere, Edwin, MD  Encounter Date: 02/08/2015      PT End of Session - 02/08/15 1653    Visit Number 1   Number of Visits 4   Date for PT Re-Evaluation 03/19/15   Authorization Type Medicaid   Authorization Time Period 02/08/15-03/19/15   PT Start Time 1105   PT Stop Time 1143   PT Time Calculation (min) 38 min   Activity Tolerance Patient tolerated treatment well   Behavior During Therapy [facility] for tasks assessed/performed      Past Medical History  Diagnosis Date  . 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  . Quadriplegia     Secondary to stroke at age 29-has trach-lives with boyfriend-motorized w/c-joystick to chin to drive w/c--I & O cath thru suprapubic stoma - has nursing care 16 hrs daily and boyfriend able to help pt the other hours of day.  . Recurrent UTI (urinary tract infection)     Due to suprapubic stoma, repetitive catheterizations  . Sleep apnea     On ventilator at night -has oxygen but rarely needs unless she has a cold and sats drop  . Depression     Zoloft has helped-not having any current depression  . Edema extremities     Feet and legs-mostly in feet  . Headache(784.0)     Prior to imipramine-none currently    Past Surgical History  Procedure Laterality Date  . Ileostomy  2006  . Esophagogastroduodenoscopy    . Spine surgery  2004,2011  . Tracheostomy  2000  . Gastrostomy tube placement  2000  . Bronchoscopy  2006  . Throat surgery  2004  . Wisdom tooth extraction  2008  . Bladder surgery  2009,2012  . Tendon repair  2012    Left wrist  . Removal of gastrostomy tube    .  Cholecystectomy  11/18/2012    Procedure: LAPAROSCOPIC CHOLECYSTECTOMY;  Surgeon: Axel FillerArmando Ramirez, MD;  Location: [facility];  Service: General;  Laterality: N/A;    There were no vitals filed for this visit.  Visit Diagnosis:  Ankle stiffness, left - Plan: PT plan of care cert/re-cert  Ankle stiffness, right - Plan: PT plan of care cert/re-cert  Abnormal increased muscle tone      Subjective Assessment - 02/08/15 1643    Symptoms Patient presents with history of brainstem stroke at age 29 with resultant quadriparesis.  She currently presents in electric wheelchair with chin operated joystick.  Her P[employer] caregiver accompanies her today.  She reports having difficulty keeping shoes on, wearing certain styles of shoes and getting feet positioned properly on footplates due to "foot drop." She would like to get splints to help with this issue.   Her caregiver also reports doing daily stretches with patient, but not sure she is doing it correctly both for the patient and for herself in terms of injury  prevention.   Currently in Pain? No/denies            [facility] - 02/08/15 0001    Assessment   Medical Diagnosis Quadraplegia   Prior Therapy 3 years ago   Balance Screen   Has the patient fallen in the  past 6 months No   Has the patient had a decrease in activity level because of a fear of falling?  No   Is the patient reluctant to leave their home because of a fear of falling?  No   Home Environment   Living Enviornment Other (Comment)   Additional Comments Retirement facility   Prior Function   Level of Independence Needs assistance with transfers;Needs assistance with ADLs   Cognition   Overall Cognitive Status Within Functional Limits for tasks assessed   Observation/Other Assessments   Observations Patient keeps ankle in equinovarus positioning on foot plates on wheelchair and with attempts to place foot flat developes increased spasticity with ankle clonus   Sensation    Light Touch Appears Intact   Tone   Assessment Location Right Lower Extremity;Left Lower Extremity   ROM / Strength   AROM / PROM / Strength PROM   AROM   Right Ankle Dorsiflexion 35  PROM   Right Ankle Plantar Flexion 70  PROM   PROM   Overall PROM Comments --   PROM Assessment Site Ankle   Right/Left Ankle Right   Left Ankle Dorsiflexion 51   Left Ankle Plantar Flexion 76   Flexibility   Soft Tissue Assessment /Muscle Length yes   Wheelchair Mobility   Wheelchair Mobility Yes   Wheelchair Assistance 6: Modified independent (Device/Increase time)   Occupational hygienist Power  chin operated joystick   RLE Tone   RLE Tone Hypertonic;Moderate   LLE Tone   LLE Tone Moderate;Hypertonic                           PT Education - 02/08/15 1653    Education provided Yes   Education Details POC, JAS brochure   Person(s) Educated Patient;Caregiver(s)   Methods Explanation;Handout   Comprehension Verbalized understanding             PT Long Term Goals - 02/08/15 1700    PT LONG TERM GOAL #1   Title Patient able to sit in wheelchair with feet flat on foot plates for proper positioning and weight unloading for injury prevention.  03/19/15   Baseline Sits in chair with ankles positioned in equinovarus and weight on lateral foot surfaces   Status New   PT LONG TERM GOAL #2   Title Patient to demonstrate increased PROM ankle DF to at least 40 degrees from neutral on left and 25 degrees from neutral on right for improved foot flat positioning.  03/19/15   Baseline Right ankle DF PROM 35 degrees from neutral; left ankle DF PROM 51 degrees from neutral.   Status New   PT LONG TERM GOAL #3   Title Patient's caregiver to demonstrate proper technique for stretching program for LE's for improved spasticity management including tone inhibition techniques.  03/19/15   Baseline Caregiver reports doing stretching routine, but is difficult for her to complete and not sure  doing it for best benefit from patient.   Status New               Plan - 02/08/15 1655    Clinical Impression Statement Patient with history of quadriparesis, dependency in ADL's and mobility, presents with limited PROM and moderate hypertonicity/spasticity in the LE's limiting positioning for comfort and balance in her wheelchair as well as ability to keep on appropriate foot wear putting her at risk for possible skin issues and increased difficulty with ADL's.  She will benefit from skilled  PT to address ROM limitations, initiate dynamic splints for home use as well as to address patient's home stretching program.   Pt will benefit from skilled therapeutic intervention in order to improve on the following deficits Decreased range of motion;Impaired flexibility;Impaired tone;Increased muscle spasms   Rehab Potential Good   PT Frequency 1x / week   PT Duration 4 weeks   PT Treatment/Interventions ADLs/Self Care Home Management;Therapeutic activities;Patient/family education;Passive range of motion;DME Instruction;Manual techniques;Splinting   PT Next Visit Plan Measure for JAS brace for ankle dynamic stretch into dorsiflexion (?with assist of rep)  Then transfer pt to mat to observe caregiver perform stretching routine and modify for pt/caregiver safety.   Consulted and Agree with Plan of Care Patient;Family member/caregiver   Family Member Consulted aide          G-Codes - 2015-03-04 1705    Functional Assessment Tool Used Cinical judgement   Functional Limitation Changing and maintaining body position   Changing and Maintaining Body Position Current Status [phone]) At least 40 percent but less than 60 percent impaired, limited or restricted   Changing and Maintaining Body Position Goal Status (U0454) At least 20 percent but less than 40 percent impaired, limited or restricted       Problem List Patient Active Problem List   Diagnosis Date Noted  . Venous (peripheral)  insufficiency 12/24/2014  . Quadriplegia   . CVA (cerebral infarction)     [facility] 03-04-15, 5:11 PM  Sheran Lawless, PT  [facility] [address] [place], [place], [address] Phone: [phone]   Fax:  [phone]

## 2015-02-08 NOTE — Patient Instructions (Signed)
Educated in plan of care and setting up appointment for JAS rep at next visit.

## 2015-02-24 ENCOUNTER — Ambulatory Visit: Payer: Medicaid Other | Admitting: Physical Therapy

## 2015-02-24 ENCOUNTER — Telehealth: Payer: Self-pay | Admitting: Physical Therapy

## 2015-02-24 NOTE — Telephone Encounter (Signed)
Spoke with patient today to let her know conversation between therapist and Contractorinsurance coordinator, [name], that Medicaid has not yet approved PT visits.  Medicaid was initially denied and was resubmitted on 02/19/15.  We are still awaiting authorization from that resubmission.  Pt aware of next PT appointment on 03/01/15.  PT recommends planning to keep that appointment, and PT will contact patient further regarding Medicaid authorization if necessary.

## 2015-03-01 ENCOUNTER — Ambulatory Visit: Payer: Medicaid Other | Attending: Internal Medicine | Admitting: Physical Therapy

## 2015-03-01 DIAGNOSIS — M25671 Stiffness of right ankle, not elsewhere classified: Secondary | ICD-10-CM

## 2015-03-01 DIAGNOSIS — M25672 Stiffness of left ankle, not elsewhere classified: Secondary | ICD-10-CM | POA: Insufficient documentation

## 2015-03-01 DIAGNOSIS — M6289 Other specified disorders of muscle: Secondary | ICD-10-CM | POA: Diagnosis not present

## 2015-03-01 NOTE — Therapy (Signed)
[facility] [address] [place], [place], [address] Phone: [phone]   Fax:  [phone]  Physical Therapy Treatment  Patient Details  Name: Veronica Huff MRN: [id number] Date of Birth: Feb 18, 1986 Referring Provider:  Fleet Contras, MD  Encounter Date: 03/01/2015      PT End of Session - 03/01/15 2147    Visit Number 2   Number of Visits 4   Date for PT Re-Evaluation 03/19/15   Authorization Type Medicaid   Authorization Time Period Medicaid approved 02/23/15-04/06/15   Authorization - Visit Number 1   Authorization - Number of Visits 3   PT Start Time 1106   PT Stop Time 1149   PT Time Calculation (min) 43 min   Equipment Utilized During Treatment Gait belt   Activity Tolerance Patient tolerated treatment well   Behavior During Therapy [facility] for tasks assessed/performed      Past Medical History  Diagnosis Date  . CVA (cerebral infarction)     Ischemic brainstem stroke in 2011 at age 61 due to unknown reasons--quadriplegic after stroke.  . Quadriplegia     Secondary to stroke at age 40-has trach-lives with boyfriend-motorized w/c-joystick to chin to drive w/c--I & O cath thru suprapubic stoma - has nursing care 16 hrs daily and boyfriend able to help pt the other hours of day.  . Recurrent UTI (urinary tract infection)     Due to suprapubic stoma, repetitive catheterizations  . Sleep apnea     On ventilator at night -has oxygen but rarely needs unless she has a cold and sats drop  . Depression     Zoloft has helped-not having any current depression  . Edema extremities     Feet and legs-mostly in feet  . Headache(784.0)     Prior to imipramine-none currently    Past Surgical History  Procedure Laterality Date  . Ileostomy  2006  . Esophagogastroduodenoscopy    . Spine surgery  2004,2011  . Tracheostomy  2000  . Gastrostomy tube placement  2000  . Bronchoscopy  2006  . Throat surgery  2004  .  Wisdom tooth extraction  2008  . Bladder surgery  2009,2012  . Tendon repair  2012    Left wrist  . Removal of gastrostomy tube    . Cholecystectomy  11/18/2012    Procedure: LAPAROSCOPIC CHOLECYSTECTOMY;  Surgeon: Axel Filler, MD;  Location: [facility];  Service: General;  Laterality: N/A;    There were no vitals filed for this visit.  Visit Diagnosis:  Abnormal increased muscle tone  Ankle stiffness, right  Ankle stiffness, left      Subjective Assessment - 03/01/15 2144    Subjective Pt reports getting a text from JAS rep that her JAS splints may be ready.  She and caregiver are present-interested in learning new ways that caregiver can safely perform exercises.   Patient is accompained by: --  caregiver   Currently in Pain? No/denies       Therapeutic Activity:  Power wheelchair<>Mat transfer +2 total assist with therapist at shoulder area and tech at lower extremities.  PT provides total assist for sitting edge of mat, then +2 total assistance for sit<>supine.    Manual Therapy:  Provided stretching and positioning activities through lower extremities, demonstrating to pt and caregiver optimal stretching techniques.  L heelcord stretch with knee flexed and heel resting on therapist knee, with assistance of bring L foot into eversion for improved ankle dorsiflexion stretch.  Weightbearing stretch  for L heelcords, with L knee flexion and foot placed in neutral position on mat surface, with compression through knee and ankle joints.  Pt has increased spasms at beginning of treatment; however, with ankle placed in more neutral/everted position, pt demonstrates decreased spasms.  Hip external rotation stretch with cues for gentle stretch and to not force stretch through spasms.  Attempted supine hamstring stretch; however, pt reports (past) discomfort in knee area with this stretch.  Long sit stretch of hamstrings with maximal assistance of therapist keeping pt in position, with  caregiver positioning lower extremities in neutral position.  Self Care: Educated pt/caregiver in importance of proper positioning for decreased spasms during stretches-placing knee in flexion vs. Extension, placing ankle in neutral/everted position vs. Inverted position, utilizing quick stretch into spasm further into direction of spasm to help alleviate spasm.  Discussed caregiver positioning of hand/wrist to avoid pain.                        PT Education - 03/01/15 2146    Education provided Yes   Education Details HEP:  Heelcord stretching with knee in flexed position to assist in alleviating spasms; long sit stretch for hamstrings.   Person(s) Educated Patient;Caregiver(s)   Methods Explanation;Demonstration;Verbal cues;Tactile cues;Handout   Comprehension Verbalized understanding;Returned demonstration;Need further instruction             PT Long Term Goals - 02/08/15 1700    PT LONG TERM GOAL #1   Title Patient able to sit in wheelchair with feet flat on foot plates for proper positioning and weight unloading for injury prevention.  03/19/15   Baseline Sits in chair with ankles positioned in equinovarus and weight on lateral foot surfaces   Status New   PT LONG TERM GOAL #2   Title Patient to demonstrate increased PROM ankle DF to at least 40 degrees from neutral on left and 25 degrees from neutral on right for improved foot flat positioning.  03/19/15   Baseline Right ankle DF PROM 35 degrees from neutral; left ankle DF PROM 51 degrees from neutral.   Status New   PT LONG TERM GOAL #3   Title Patient's caregiver to demonstrate proper technique for stretching program for LE's for improved spasticity management including tone inhibition techniques.  03/19/15   Baseline Caregiver reports doing stretching routine, but is difficult for her to complete and not sure doing it for best benefit from patient.   Status New               Plan - 03/01/15 2149     Clinical Impression Statement Pt and caregiver appear motivated to learn proper and safe techniques for improved stretching of lower extremities, due to discomfort with increased spasiticity.  Stretching of heelcords with knee in flexed position appears to have decreased spasms, especially when foot is position in everted position.  JAS splints have been ordered and awaiting delivery/pt fitting.   Pt will benefit from skilled therapeutic intervention in order to improve on the following deficits Decreased range of motion;Impaired flexibility;Impaired tone;Increased muscle spasms   Rehab Potential Good   PT Frequency 1x / week   PT Duration 4 weeks   PT Treatment/Interventions ADLs/Self Care Home Management;Therapeutic activities;Patient/family education;Passive range of motion;DME Instruction;Manual techniques;Splinting   PT Next Visit Plan review HEP; continue hip and hamstring stretches, heelcord stretching, lumbar stretches   Consulted and Agree with Plan of Care Patient;Family member/caregiver   Family Member Consulted aide  Problem List Patient Active Problem List   Diagnosis Date Noted  . Venous (peripheral) insufficiency 12/24/2014  . Quadriplegia   . CVA (cerebral infarction)     MARRIOTT,AMY W. 03/01/2015, 9:58 PM  Amy Bernie Covey, PT 03/01/2015 10:12 PM Phone: [phone] Fax: [phone]   [facility] [address] [place], [place], [address] Phone: [phone]   Fax:  [phone]

## 2015-03-01 NOTE — Patient Instructions (Signed)
Stretching for heelcords in supine (lying down) position: -Gently bend the knee and rest Veronica Huff's heel on your leg, as you are standing beside her with your leg propped on her bed. -"Cup" her foot with your hand so that you are able to pull her toes up and out, then gently push toes up to stretch at her heelcords. -Ideally, you want to stretch for 30 seconds, at least 3 reps each side  You can also put Veronica Huff's knee in a bent position with foot flat on the mat, making sure to position the foot in a neutral position.  You can press through the knee to bear weight in this position.  In a long sitting position, try to keep knees straight and neutral positioned in order to stretch hamstrings.  You can be in this position for 3-5 minutes.

## 2015-03-12 ENCOUNTER — Ambulatory Visit: Payer: Medicaid Other | Admitting: Physical Therapy

## 2015-03-12 DIAGNOSIS — M25672 Stiffness of left ankle, not elsewhere classified: Secondary | ICD-10-CM

## 2015-03-12 DIAGNOSIS — M6289 Other specified disorders of muscle: Secondary | ICD-10-CM

## 2015-03-12 DIAGNOSIS — M25671 Stiffness of right ankle, not elsewhere classified: Secondary | ICD-10-CM

## 2015-03-12 NOTE — Therapy (Signed)
[facility] [address] [place], [place], [address] Phone: [phone]   Fax:  [phone]  Physical Therapy Treatment  Patient Details  Name: Veronica Huff MRN: [id number] Date of Birth: 06-Dec-1985 Referring Provider:  Fleet ContrasAvbuere, Edwin, MD  Encounter Date: 03/12/2015      PT End of Session - 03/12/15 1222    Visit Number 3   Number of Visits 4   Date for PT Re-Evaluation 03/19/15   Authorization Type Medicaid   Authorization Time Period Medicaid approved 02/23/15-04/06/15   Authorization - Visit Number 2   Authorization - Number of Visits 3   PT Start Time 1107   PT Stop Time 1157   PT Time Calculation (min) 50 min      Past Medical History  Diagnosis Date  . 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  . Quadriplegia     Secondary to stroke at age 29-has trach-lives with boyfriend-motorized w/c-joystick to chin to drive w/c--I & O cath thru suprapubic stoma - has nursing care 16 hrs daily and boyfriend able to help pt the other hours of day.  . Recurrent UTI (urinary tract infection)     Due to suprapubic stoma, repetitive catheterizations  . Sleep apnea     On ventilator at night -has oxygen but rarely needs unless she has a cold and sats drop  . Depression     Zoloft has helped-not having any current depression  . Edema extremities     Feet and legs-mostly in feet  . Headache(784.0)     Prior to imipramine-none currently    Past Surgical History  Procedure Laterality Date  . Ileostomy  2006  . Esophagogastroduodenoscopy    . Spine surgery  2004,2011  . Tracheostomy  2000  . Gastrostomy tube placement  2000  . Bronchoscopy  2006  . Throat surgery  2004  . Wisdom tooth extraction  2008  . Bladder surgery  2009,2012  . Tendon repair  2012    Left wrist  . Removal of gastrostomy tube    . Cholecystectomy  11/18/2012     Procedure: LAPAROSCOPIC CHOLECYSTECTOMY;  Surgeon: Axel FillerArmando Ramirez, MD;  Location: [facility];  Service: General;  Laterality: N/A;    There were no vitals filed for this visit.  Visit Diagnosis:  Abnormal increased muscle tone  Ankle stiffness, right  Ankle stiffness, left      Subjective Assessment - 03/12/15 1209    Subjective Brought JAS splints in today.  Couldnt remember exactly how to instruct caregiver to put them on.   Patient is accompained by: --  caregiver   Pertinent History Received JAS splints for ankle ROM earlier this week; has not been using them   Currently in Pain? No/denies           Self Care:  Pt reports that JAS rep delivered splints and showed her and boyfriend how to use them earlier this week.  However, she has not been using them due to not fully remembering instructions to be able to show caregiver.  With pt in chair, PT attempted multiple times throughout session on bilateral lower extremities to perform slowed ROM to get patient into most neutral ankle position (with decreased inversion, decreased plantarflexion).  PT attempts placing foot into JAS splint; however, heel does not appear fully seated into splint.  PT strapped patient into R JAS splint and instructed pt/caregiver to  use clockwise turns to increase ankle dorsiflexion until gentle stretch is felt.  Pt kept R JAS splint on for approximately 10-15 minutes, while PT attempts positioning of L ankle into JAS splint.  Pt is aware of wear time of 30 minutes, 3x/day.  PT discussed and showed caregiver how to place foot in correct position in R JAS splint.  Caregiver has difficulty due to increased tone on RLE.  Once caregiver does get pt placed into splint, pt is noted to have increased inversion at ankle, with again, heel appearing not fully seated into splint.  PT doffs splints and inspects skin.  Slight redness noted on superior aspect of foot-discussed that redness should go away in 15-30 minutes.   If not, splints should not be worn again that day.  However, due to concerns of foot positioning, PT recommends holding off wearing splints until JAS rep could be contacted to potentially come to next PT visit.  Recommended to caregiver that she practice donning and doffing JAS splints, just not wear them at this time.  Placed call and left voicemail for [employer], JAS rep.                      PT Education - 03/12/15 1220    Education provided Yes   Education Details Instructions for JAS splint donning/doffing/foot positioning as well as skin inspection.  Recommended not wearing splints until contacting and speaking with JAS rep regarding foot positioning.   Person(s) Educated Patient;Caregiver(s)   Methods Explanation;Demonstration;Verbal cues;Tactile cues   Comprehension Verbalized understanding;Returned demonstration             PT Long Term Goals - 02/08/15 1700    PT LONG TERM GOAL #1   Title Patient able to sit in wheelchair with feet flat on foot plates for proper positioning and weight unloading for injury prevention.  03/19/15   Baseline Sits in chair with ankles positioned in equinovarus and weight on lateral foot surfaces   Status New   PT LONG TERM GOAL #2   Title Patient to demonstrate increased PROM ankle DF to at least 40 degrees from neutral on left and 25 degrees from neutral on right for improved foot flat positioning.  03/19/15   Baseline Right ankle DF PROM 35 degrees from neutral; left ankle DF PROM 51 degrees from neutral.   Status New   PT LONG TERM GOAL #3   Title Patient's caregiver to demonstrate proper technique for stretching program for LE's for improved spasticity management including tone inhibition techniques.  03/19/15   Baseline Caregiver reports doing stretching routine, but is difficult for her to complete and not sure doing it for best benefit from patient.   Status New               Plan - 03/12/15 1223    Clinical  Impression Statement JAS splints delivered and brought to therapy today.  Therapist has concerns about foot positioning in brace, due to difficulty obtaining neutral ankle position to avoid increased ankle inversion.  Caregiver reports she was not successful in attempts to work on ROM as instructed with knees flexed last visit, so she is perfoming as she has done in the past.   Pt will benefit from skilled therapeutic intervention in order to improve on the following deficits Decreased range of motion;Impaired flexibility;Impaired tone;Increased muscle spasms   Rehab Potential Good   PT Frequency 1x / week   PT Duration 4 weeks   PT Treatment/Interventions ADLs/Self  Care Home Management;Therapeutic activities;Patient/family education;Passive range of motion;DME Instruction;Manual techniques;Splinting   PT Next Visit Plan review HEP; continue hip and hamstring stretches, heelcord stretching, lumbar stretches; JAS rep to be present during therapy   Consulted and Agree with Plan of Care Patient;Family member/caregiver   Family Member Consulted aide        Problem List Patient Active Problem List   Diagnosis Date Noted  . Venous (peripheral) insufficiency 12/24/2014  . Quadriplegia   . CVA (cerebral infarction)     Aleshka Corney W. 03/12/2015, 12:26 PM  Twan Harkin Bernie Covey, PT 03/12/2015 12:26 PM Phone: [phone] Fax: [phone]   [facility] [address] [place], [place], [address] Phone: [phone]   Fax:  [phone]

## 2015-03-19 ENCOUNTER — Ambulatory Visit: Payer: Medicaid Other | Admitting: Physical Therapy

## 2015-03-31 ENCOUNTER — Ambulatory Visit: Payer: Medicaid Other | Attending: Internal Medicine | Admitting: Physical Therapy

## 2015-03-31 DIAGNOSIS — M25672 Stiffness of left ankle, not elsewhere classified: Secondary | ICD-10-CM

## 2015-03-31 DIAGNOSIS — M6289 Other specified disorders of muscle: Secondary | ICD-10-CM | POA: Diagnosis not present

## 2015-03-31 DIAGNOSIS — M25671 Stiffness of right ankle, not elsewhere classified: Secondary | ICD-10-CM | POA: Diagnosis not present

## 2015-04-01 DIAGNOSIS — M25672 Stiffness of left ankle, not elsewhere classified: Secondary | ICD-10-CM | POA: Diagnosis not present

## 2015-04-01 NOTE — Therapy (Signed)
[facility] [address] [place], [place], [address] Phone: [phone]   Fax:  [phone]  Physical Therapy Treatment  Patient Details  Name: Veronica Huff MRN: [id number] Date of Birth: Feb 01, 1986 Referring Provider:  Nolene Ebbs, MD  Encounter Date: 03/31/2015      PT End of Session - 04/01/15 1629    Visit Number 4   Number of Visits 4   Authorization Type Medicaid   Authorization Time Period Medicaid approved 02/23/15-04/06/15   Authorization - Visit Number 3   Authorization - Number of Visits 3   Equipment Utilized During Treatment Gait belt   Activity Tolerance Patient tolerated treatment well   Behavior During Therapy [facility] for tasks assessed/performed      Past Medical History  Diagnosis Date  . 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  . Quadriplegia     Secondary to stroke at age 66-has trach-lives with boyfriend-motorized w/c-joystick to chin to drive w/c--I & O cath thru suprapubic stoma - has nursing care 16 hrs daily and boyfriend able to help pt the other hours of day.  . Recurrent UTI (urinary tract infection)     Due to suprapubic stoma, repetitive catheterizations  . Sleep apnea     On ventilator at night -has oxygen but rarely needs unless she has a cold and sats drop  . Depression     Zoloft has helped-not having any current depression  . Edema extremities     Feet and legs-mostly in feet  . Headache(784.0)     Prior to imipramine-none currently    Past Surgical History  Procedure Laterality Date  . Ileostomy  2006  . Esophagogastroduodenoscopy    . Spine surgery  2004,2011  . Tracheostomy  2000  . Gastrostomy tube placement  2000  . Bronchoscopy  2006  . Throat surgery  2004  . Wisdom tooth extraction  2008  . Bladder surgery  2009,2012  . Tendon repair  2012    Left wrist  . Removal of  gastrostomy tube    . Cholecystectomy  11/18/2012    Procedure: LAPAROSCOPIC CHOLECYSTECTOMY;  Surgeon: Ralene Ok, MD;  Location: [facility];  Service: General;  Laterality: N/A;    There were no vitals filed for this visit.  Visit Diagnosis:  Abnormal increased muscle tone  Ankle stiffness, right  Ankle stiffness, left      Subjective Assessment - 03/31/15 1112    Subjective having some occasional pain in R shoulder; no pain now.  JAS Rep to come later in session.   Currently in Pain? No/denies      Self Care:  +2 dependent transfer wheelchair<>mat.  Discussed with caregiver current ROM routine from instructions given in previous visits.  Caregiver and pt report improved ease of passive ROM with techniques shown by PT.  PT instructs caregiver in shoulder ROM:  She reports she is currently performing shoulder circles, at times above 90 degrees.  Pt reports pain often occurs when shoulder is above 90 degrees of flexion.  PT demonstrates and instructs caregiver in shoulder flexion, then shoulder abduction ROM, with hand placement at shoulder to prevent shoulder elevation.  Caregiver return demonstrates understanding.  JAS Rep present for last 15 minutes of session (and beyond) to instruct pt/caregiver again on proper positioning of JAS splints.  PT asks questions regarding positioning to avoid excessive over pronation and to avoid reddened areas from  splint wear.      [facility] PT Assessment - 04/26/15 0001    PROM   Overall PROM Comments R ankle lacks 40 degrees neutral dorsiflexion; L lacks 44 degrees neutral dorsiflexion   Right/Left Ankle Right;Left                             PT Education - 26-Apr-2015 1626    Education provided Yes   Education Details Demonstration of shoulder ROM   Person(s) Educated Patient;Caregiver(s)   Methods Explanation;Demonstration   Comprehension Verbalized understanding;Returned demonstration             PT Long Term Goals  - 26-Apr-2015 1632    PT LONG TERM GOAL #1   Title Patient able to sit in wheelchair with feet flat on foot plates for proper positioning and weight unloading for injury prevention.  03/19/15   Baseline has been unable to use JAS splints yet due to difficulty in positioning foot in splints; JAS rep present for additional instruction at last visit.   Status Not Met   PT LONG TERM GOAL #2   Title Patient to demonstrate increased PROM ankle DF to at least 40 degrees from neutral on left and 25 degrees from neutral on right for improved foot flat positioning.  03/19/15   Baseline Right ankle DF PROM 40 degrees from neutral; left ankle DF PROM 44 degrees from neutral.   Status Not Met   PT LONG TERM GOAL #3   Title Patient's caregiver to demonstrate proper technique for stretching program for LE's for improved spasticity management including tone inhibition techniques.  03/19/15   Status Achieved               Plan - 04/26/15 1629    Clinical Impression Statement JAS rep present today to answer PT questions and instruct again patient and caregiver in proper use of JAS splints at ankles.  Caregiver reports overall ease of stretching with techniques learned in therapy.  Pt is discharging this visit.  Pt has not met LTG #1 and 2; LTG #3 is met.   Pt will benefit from skilled therapeutic intervention in order to improve on the following deficits Decreased range of motion;Impaired flexibility;Impaired tone;Increased muscle spasms   PT Treatment/Interventions ADLs/Self Care Home Management;Therapeutic activities;Patient/family education;Passive range of motion;DME Instruction;Manual techniques;Splinting   PT Next Visit Plan D/C this visit   Consulted and Agree with Plan of Care Patient;Family member/caregiver   Family Member Consulted aide          G-Codes - 2015-04-26 1633    Functional Assessment Tool Used Cinical judgement   Functional Limitation Changing and maintaining body position   Changing  and Maintaining Body Position Goal Status [phone]) At least 20 percent but less than 40 percent impaired, limited or restricted   Changing and Maintaining Body Position Discharge Status ([id number]) At least 20 percent but less than 40 percent impaired, limited or restricted    PHYSICAL THERAPY DISCHARGE SUMMARY  Visits from Start of Care: 4  Current functional level related to goals / functional outcomes:     PT Long Term Goals - 04-26-15 1632    PT LONG TERM GOAL #1   Title Patient able to sit in wheelchair with feet flat on foot plates for proper positioning and weight unloading for injury prevention.  03/19/15   Baseline has been unable to use JAS splints yet due to difficulty in positioning foot in splints; JAS rep present  for additional instruction at last visit.   Status Not Met   PT LONG TERM GOAL #2   Title Patient to demonstrate increased PROM ankle DF to at least 40 degrees from neutral on left and 25 degrees from neutral on right for improved foot flat positioning.  03/19/15   Baseline Right ankle DF PROM 40 degrees from neutral; left ankle DF PROM 44 degrees from neutral.   Status Not Met   PT LONG TERM GOAL #3   Title Patient's caregiver to demonstrate proper technique for stretching program for LE's for improved spasticity management including tone inhibition techniques.  03/19/15   Status Achieved       Remaining deficits: Increased tone and spasms, decreased ROM   Education / Equipment: Pt/caregiver educated in proper stretching and ROM techniques; JAS splints set up for patient for bilateral ankle ROM  Plan: Patient agrees to discharge.  Patient goals were not met. Patient is being discharged due to                                                     ?????      Problem List Patient Active Problem List   Diagnosis Date Noted  . Venous (peripheral) insufficiency 12/24/2014  . Quadriplegia   . CVA (cerebral infarction)     Nadalyn Deringer W. 04/01/2015, 4:35  PM Frazier Butt., PT [facility] [address] [place], [place], [address] Phone: [phone]   Fax:  [phone]

## 2015-04-14 ENCOUNTER — Ambulatory Visit (INDEPENDENT_AMBULATORY_CARE_PROVIDER_SITE_OTHER): Payer: Medicaid Other | Admitting: Diagnostic Neuroimaging

## 2015-04-14 ENCOUNTER — Encounter: Payer: Self-pay | Admitting: Diagnostic Neuroimaging

## 2015-04-14 VITALS — Ht 62.0 in

## 2015-04-14 DIAGNOSIS — G8250 Quadriplegia, unspecified: Secondary | ICD-10-CM

## 2015-04-14 DIAGNOSIS — I639 Cerebral infarction, unspecified: Secondary | ICD-10-CM

## 2015-04-14 MED ORDER — DANTROLENE SODIUM 25 MG PO CAPS: 25.0000 mg | ORAL_CAPSULE | Freq: Three times a day (TID) | ORAL | Status: DC

## 2015-04-14 NOTE — Progress Notes (Signed)
[facility]  PATIENT: Veronica Huff DOB: September 21, 1986  REFERRING CLINICIAN: Avbuere, E HISTORY FROM: patient and caregiver Veronica Huff) REASON FOR VISIT: new consult    HISTORICAL  CHIEF COMPLAINT:  Chief Complaint  Patient presents with  . New Evaluation    muscle spasms in lower legs and issues with incontinence of the bowel    HISTORY OF PRESENT ILLNESS:   29 year old ambidextrous female with history of brainstem stroke at age 76 years old, with resultant spastic quadriplegia, neurogenic bowel, here for evaluation of spasticity and constipation issues. Patient had been managed by physiatry as stated in [place] previously, but now managed by PCP for past 3 years. Patient had requested referral to PM&R/physiatry in [place], but for some reason PCP made referral to our practice instead. In summary at age 47 years old patient suffered catastrophic brainstem stroke (cryptogenic), requiring intubation, ventilator support, ICU care, with extensive rehabilitation. With expert care and great effort, patient was able to recover ability to communicate as well as breathe without ventilator support. She required a number of surgeries over the years related to complications from this stroke. Her current issues mainly consist of muscle spasms in the legs especially in the left leg, which was previously treated with Botox and dantrolene. She also has difficulty managing her bowel function, struggling with constipation.    REVIEW OF SYSTEMS: Full 14 system review of systems performed and notable only for headache difficulty swallowing flushing joint pain allergies incontinence trouble swallowing blurred vision double vision.  ALLERGIES: Allergies  Allergen Reactions  . Bee Venom Shortness Of Breath and Swelling    Anaphylactic shock  . Ciprofloxacin     hives  . Erythromycin   . Morphine And Related Other (See Comments)    Goes crazy  . Tobramycin   . Trazodone And  Nefazodone     HOME MEDICATIONS: Outpatient Prescriptions Prior to Visit  Medication Sig Dispense Refill  . acetaminophen (TYLENOL) 500 MG tablet Take 500 mg by mouth every 6 (six) hours as needed (pain).    . baclofen (LIORESAL) 20 MG tablet Take 40 mg by mouth 4 (four) times daily.    . bisacodyl (DULCOLAX) 10 MG suppository Place 10 mg rectally every other day as needed. constipation    . imipramine (TOFRANIL) 25 MG tablet Take 75-100 mg by mouth at bedtime.    Marland Kitchen ketoconazole (NIZORAL) 2 % shampoo Apply 1 application topically 2 (two) times a week.    . Multiple Vitamin (MULTIVITAMIN) tablet Take 1 tablet by mouth daily.    . mupirocin ointment (BACTROBAN) 2 % 1 application by Tracheal Tube route daily. Applies to trach-stoma site    . neomycin-polymyxin B (NEOSPORIN GU IRRIGANT) SOLN 30-40 mLs by GU irrigant route at bedtime. Instilled thru I&O catheter at bedtime-to prevent uti    . nitrofurantoin (MACRODANTIN) 50 MG capsule Take 50 mg by mouth at bedtime.    . polyethylene glycol (MIRALAX / GLYCOLAX) packet Take 17 g by mouth daily.    . sertraline (ZOLOFT) 100 MG tablet Take 100 mg by mouth at bedtime.    . solifenacin (VESICARE) 10 MG tablet Take 10 mg by mouth daily. Every am    . sucralfate (CARAFATE) 1 GM/10ML suspension Take 1 g by mouth 3 (three) times daily.     Marland Kitchen loratadine (CLARITIN) 10 MG tablet Take 10 mg by mouth daily. At night    . phenazopyridine (PYRIDIUM) 97 MG tablet Take 97 mg by mouth 3 (three) times daily as  needed for pain (pain   AZO).     No facility-administered medications prior to visit.    PAST MEDICAL HISTORY: Past Medical History  Diagnosis Date  . 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  . Quadriplegia     Secondary to stroke at age 52-has trach-lives with boyfriend-motorized w/c-joystick to chin to drive w/c--I & O cath thru suprapubic stoma - has nursing care 16  hrs daily and boyfriend able to help pt the other hours of day.  . Recurrent UTI (urinary tract infection)     Due to suprapubic stoma, repetitive catheterizations  . Sleep apnea     On ventilator at night -has oxygen but rarely needs unless she has a cold and sats drop  . Depression     Zoloft has helped-not having any current depression  . Edema extremities     Feet and legs-mostly in feet  . Headache(784.0)     Prior to imipramine-none currently    PAST SURGICAL HISTORY: Past Surgical History  Procedure Laterality Date  . Ileostomy  2006  . Esophagogastroduodenoscopy    . Spine surgery  2004,2011  . Tracheostomy  2000  . Gastrostomy tube placement  2000  . Bronchoscopy  2006  . Throat surgery  2004  . Wisdom tooth extraction  2008  . Bladder surgery  2009,2012  . Tendon repair  2012    Left wrist  . Removal of gastrostomy tube    . Cholecystectomy  11/18/2012    Procedure: LAPAROSCOPIC CHOLECYSTECTOMY;  Surgeon: Axel Filler, MD;  Location: [facility];  Service: General;  Laterality: N/A;    FAMILY HISTORY: Family History  Problem Relation Age of Onset  . Diabetes Mother   . Hypertension Mother   . Varicose Veins Mother   . Cancer Maternal Aunt   . Diabetes Maternal Grandfather   . Heart disease Maternal Grandfather   . Heart disease Paternal Grandfather     SOCIAL HISTORY:  History   Social History  . Marital Status: Single    Spouse Name: N/A  . Number of Children: N/A  . Years of Education: college    Occupational History  . Not on file.   Social History Main Topics  . Smoking status: Never Smoker   . Smokeless tobacco: Not on file  . Alcohol Use: No  . Drug Use: No  . Sexual Activity: Yes    Birth Control/ Protection: None   Other Topics Concern  . Not on file   Social History Narrative   Lives at home with boyfriend   Has a RN Veronica Huff that helps to care for her    Drinks no caffeine      PHYSICAL EXAM  Filed Vitals:   04/14/15 1134    Height:  (1.575 m)    There is no weight on file to calculate BMI.  No exam data present  No flowsheet data found.  GENERAL EXAM: Patient is in no distress; well developed, nourished and groomed; neck is supple  CARDIOVASCULAR: Regular rate and rhythm, no murmurs, no carotid bruits  NEUROLOGIC: MENTAL STATUS: awake, alert, oriented to person, place and time, recent and remote memory intact, normal attention and concentration, language fluent, comprehension intact, naming intact, fund of knowledge appropriate CRANIAL NERVE: no papilledema on fundoscopic exam, pupils equal and reactive to light, visual fields full to confrontation, extraocular muscles --> SUBJECTIVE DOUBLE VISION IN ALL GAZE DIRECTIONS;  DECR VERTICAL AND HORIZONTAL GAZE, ESP LEFT AND UPWARD. POSITIVE NYSTAGMUS IN ALL DIRECTIONS. facial sensation and strength symmetric, hearing intact, palate elevates symmetrically, uvula midline, shoulder shrug symmetric, tongue midline. MOTOR: ATROPHY AND WEAKNESS IN BILATERAL UPPER EXT; BILATERAL CONTRACTURES AT BICEPS. BUE 2 PROXIMAL, 0 DISTAL. BLE INCREASED TONE, 0/5 STRENGTH. SUSTAINED CLONUS IN LEFT LEG. SENSORY: normal and symmetric to light touch, temperature, vibration COORDINATION: LIMITED DUE TO WEAKNESS REFLEXES: BRISK AT BICEPS AND KNEES GAIT/STATION: IN POWER CHAIR WITH MOUTH CONTROL    DIAGNOSTIC DATA (LABS, IMAGING, TESTING) - I reviewed patient records, labs, notes, testing and imaging myself where available.  Lab Results  Component Value Date   WBC 8.4 07/05/2013   HGB 12.9 07/05/2013   HCT 36.2 07/05/2013   MCV 84.6 07/05/2013   PLT 125* 07/05/2013      Component Value Date/Time   NA 136 07/05/2013 0530   K 3.9 07/05/2013 0530   CL 99 07/05/2013 0530   CO2 27 07/05/2013 0530   GLUCOSE 95 07/05/2013 0530   BUN 15 07/05/2013 0530   CREATININE 0.51 07/05/2013 0530   CALCIUM 9.2 07/05/2013 0530   PROT 6.9 07/05/2013 0530   ALBUMIN 3.5 07/05/2013  0530   AST 17 07/05/2013 0530   ALT 14 07/05/2013 0530   ALKPHOS 78 07/05/2013 0530   BILITOT 0.3 07/05/2013 0530   GFRNONAA >90 07/05/2013 0530   GFRAA >90 07/05/2013 0530   No results found for: CHOL, HDL, LDLCALC, LDLDIRECT, TRIG, CHOLHDL No results found for: EAVW0JHGBA1C No results found for: VITAMINB12 No results found for: TSH   11/27/98 CT head - normal study (report only)    ASSESSMENT AND PLAN  29 y.o. year old female here with cryptogenic brainstem stroke at age 29 years old, resultant spastic quadriplegia and neurogenic bowel, here to establish care [place]iat[place]. PCP made initial referral to neurology. I will set up referral to Dr. Wynn BankerKirsteins or Dr. Riley KillSwartz for further mgmt. I will rx dantrolene in the meantime until she can be referred.   PLAN:  Orders Placed This Encounter  Procedures  . Ambulatory referral to Physical Medicine Rehab   Meds ordered this encounter  Medications  . dantrolene (DANTRIUM) 25 MG capsule    Sig: Take 1 capsule (25 mg total) by mouth 3 (three) times daily.    Dispense:  90 capsule    Refill:  3   Return for refer to PM&R.    Suanne MarkerVIKRAM R. PENUMALLI, MD 04/14/2015, 12:57 PM Certified in Neurology, Neurophysiology and Neuroimaging  [facility] [address], [address] [place], [place] [address] [phone]

## 2015-04-20 ENCOUNTER — Telehealth: Payer: Self-pay | Admitting: Diagnostic Neuroimaging

## 2015-04-20 MED ORDER — METHOCARBAMOL 500 MG PO TABS: 500.0000 mg | ORAL_TABLET | Freq: Three times a day (TID) | ORAL | Status: DC | PRN

## 2015-04-20 NOTE — Telephone Encounter (Signed)
I called back.  Got no answer.  Left message.  

## 2015-04-20 NOTE — Telephone Encounter (Signed)
Moody BruinsNavia with [facility](caregiver) for patient says medicaid will not pay for dantrolene (DANTRIUM) 25 MG capsule.  Navia called Medicaid and they gave her list of scripts that would be covered.  Patient would like to try Robaxin as it has the least side effects.  Please call and advise.  Navis can be reached at [phone].

## 2015-04-20 NOTE — Telephone Encounter (Signed)
I called back to advise.  Got no answer.  Left message.  

## 2015-04-20 NOTE — Telephone Encounter (Signed)
Nasia with [facility] is calling to get a verbal discontinuation for the Rx dantrolene.  Please call.

## 2015-04-20 NOTE — Telephone Encounter (Signed)
Dantrolene cancelled. Robaxin ordered. -VRP

## 2015-04-21 NOTE — Telephone Encounter (Signed)
I called back.  Got no answer.  Left message.  

## 2015-04-21 NOTE — Telephone Encounter (Signed)
Veronica Huff with [facility] is returning a call regarding the patient. Veronica Huff can be reached until 3pm today. Thank you.

## 2015-05-20 ENCOUNTER — Telehealth: Payer: Self-pay | Admitting: Diagnostic Neuroimaging

## 2015-06-21 ENCOUNTER — Encounter: Payer: Self-pay | Admitting: Physical Medicine & Rehabilitation

## 2015-07-02 ENCOUNTER — Other Ambulatory Visit: Payer: Self-pay | Admitting: Diagnostic Neuroimaging

## 2015-07-02 NOTE — Telephone Encounter (Signed)
Has appt with Dr Riley Kill next month

## 2015-07-15 DIAGNOSIS — Z0271 Encounter for disability determination: Secondary | ICD-10-CM

## 2015-07-16 ENCOUNTER — Ambulatory Visit: Payer: Medicaid Other | Admitting: Physical Medicine & Rehabilitation

## 2015-07-30 ENCOUNTER — Encounter: Payer: Medicaid Other | Attending: Physical Medicine & Rehabilitation | Admitting: Physical Medicine & Rehabilitation

## 2015-07-30 ENCOUNTER — Encounter: Payer: Self-pay | Admitting: Physical Medicine & Rehabilitation

## 2015-07-30 VITALS — BP 102/47 | HR 89

## 2015-07-30 DIAGNOSIS — I638 Other cerebral infarction: Secondary | ICD-10-CM | POA: Insufficient documentation

## 2015-07-30 DIAGNOSIS — F411 Generalized anxiety disorder: Secondary | ICD-10-CM | POA: Diagnosis not present

## 2015-07-30 DIAGNOSIS — R454 Irritability and anger: Secondary | ICD-10-CM | POA: Insufficient documentation

## 2015-07-30 DIAGNOSIS — F911 Conduct disorder, childhood-onset type: Secondary | ICD-10-CM | POA: Diagnosis not present

## 2015-07-30 DIAGNOSIS — I639 Cerebral infarction, unspecified: Secondary | ICD-10-CM | POA: Insufficient documentation

## 2015-07-30 DIAGNOSIS — G8250 Quadriplegia, unspecified: Secondary | ICD-10-CM | POA: Diagnosis not present

## 2015-07-30 MED ORDER — DANTROLENE SODIUM 25 MG PO CAPS: 25.0000 mg | ORAL_CAPSULE | Freq: Three times a day (TID) | ORAL | Status: DC

## 2015-07-30 NOTE — Progress Notes (Addendum)
Subjective:    Patient ID: Veronica Huff, female    DOB: 1986/06/13, 29 y.o.   MRN: [id number]  HPI   This is an "initial" visit for Veronica Huff, who I last saw in 2012. 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 We had treated her in the past with oral antispasmodics as well as botox injections with reasonable results. We lost contact, potentially due to financial considerations, and she re-presents today for management of her spasticity and other ailments which have left her as a complete tetraplegic  She uses mouth controls to operate her powered w/c. She needs total assist with self-care. She is married now and apparently has assistance at home. She has two JAS splints she uses to stretch her heel cords. She no longer has splinting for the upper extremities. She has had tendon lengthening surgery for the left hand at [facility].  For spasticity she is on baclofen 40mg  qid currently. She had been on dantrium in the past which wsa quite helpful for her---it was stopped due to going non-formulary with [facility].    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 She is on a daily bowel program.  Nelly also complains of chronic nausea. She takes carafate which helps and has seen Dr. Madilyn Fireman for work up and treatment. She asked me today to order an MRI of her brain to help investigate the source of her nausea. She has had (by her report) and EGD and colonoscopy without obvious source.     Pain Inventory Average Pain 3 Pain Right Now 0 My pain is intermittent, sharp, tingling and stinging  In the last 24 hours, has pain interfered with the following? General activity 0 Relation with others 0 Enjoyment of life 3 What TIME of day is your pain at its worst? all day headaches Sleep (in general) Fair  Pain is worse with: n/a Pain improves with: n/a Relief from Meds: 4  Mobility use a wheelchair needs help with transfers  Function disabled: date disabled  .  Neuro/Psych tremor tingling spasms anxiety  Prior Studies Any changes since last visit?  no  Physicians involved in your care Any changes since last visit?  no   Family History  Problem Relation Age of Onset  . Diabetes Mother   . Hypertension Mother   . Varicose Veins Mother   . Cancer Maternal Aunt   . Diabetes Maternal Grandfather   . Heart disease Maternal Grandfather   . Heart disease Paternal Grandfather    Social History   Social History  . Marital Status: Single    Spouse Name: N/A  . Number of Children: N/A  . Years of Education: college    Social History Main Topics  . Smoking status: Never Smoker   . Smokeless tobacco: None  . Alcohol Use: No  . Drug Use: No  . Sexual Activity: Yes    Birth Control/ Protection: None   Other Topics Concern  . None   Social History Narrative   Lives at home with boyfriend   Has a RN [name] that helps to care for her    Drinks no caffeine    Past Surgical History  Procedure Laterality Date  . Ileostomy  2006  . Esophagogastroduodenoscopy    . Spine surgery  2004,2011  . Tracheostomy  2000  . Gastrostomy tube placement  2000  . Bronchoscopy  2006  . Throat surgery  2004  . Wisdom tooth extraction  2008  .  Bladder surgery  2009,2012  . Tendon repair  2012    Left wrist  . Removal of gastrostomy tube    . Cholecystectomy  11/18/2012    Procedure: LAPAROSCOPIC CHOLECYSTECTOMY;  Surgeon: Axel Filler, MD;  Location: [facility];  Service: General;  Laterality: N/A;   Past Medical History  Diagnosis Date  . CVA (cerebral infarction)     Ischemic brainstem stroke in 2011 at age 38 due to unknown reasons--quadriplegic after stroke.  . Quadriplegia     Secondary to stroke at age 73-has trach-lives with boyfriend-motorized w/c-joystick to chin to drive w/c--I & O cath thru suprapubic stoma - has nursing care 16 hrs daily and boyfriend able to help pt the other hours of day.  . Recurrent UTI (urinary tract  infection)     Due to suprapubic stoma, repetitive catheterizations  . Sleep apnea     On ventilator at night -has oxygen but rarely needs unless she has a cold and sats drop  . Depression     Zoloft has helped-not having any current depression  . Edema extremities     Feet and legs-mostly in feet  . Headache(784.0)     Prior to imipramine-none currently   BP 102/47 mmHg  Pulse 89  Opioid Risk Score:   Fall Risk Score:  `1  Depression screen PHQ 2/9  Depression screen PHQ 2/9 07/30/2015  Decreased Interest 0  Down, Depressed, Hopeless 0  PHQ - 2 Score 0  Altered sleeping 0  Tired, decreased energy 0  Change in appetite 0  Feeling bad or failure about yourself  0  Trouble concentrating 0  Moving slowly or fidgety/restless 0  Suicidal thoughts 0  PHQ-9 Score 0    Review of Systems  Respiratory: Positive for apnea.   Gastrointestinal: Positive for nausea.  Genitourinary: Positive for dysuria and difficulty urinating.  Musculoskeletal:       Spasms  Neurological: Positive for headaches.       Tingling  Psychiatric/Behavioral: The patient is nervous/anxious.   All other systems reviewed and are negative.      Objective:   Physical Exam   General: Alert and oriented x 3, No apparent distress in her powered w/c HEENT: Head is normocephalic, atraumatic, PERRLA, EOMI, sclera anicteric, oral mucosa pink and moist, dentition intact, ext ear canals clear,  Neck: Supple without JVD or lymphadenopathy, trach with speaking valve. Heart: Reg rate and rhythm. No murmurs rubs or gallops Chest: CTA bilaterally without wheezes, rales, or rhonchi; no distress Abdomen: Soft, non-tender, slightly-distended, bowel sounds positive. Has binder Extremities: No clubbing, cyanosis, or edema. Pulses are 2+ Skin: Clean and intact without signs of breakdown Neuro: Pt is cognitively appropriate with normal insight, memory, and awareness. Intact pain and general touch below the neck. No  voluntary muscle function in any of her four limbs. DTR's 3+, she has sustained clonus in both lower ext. MAS: 3/4 biceps bilat, right arm supinated with wrist hyperextended to 90's--2/4. LUE pronated at forearm/wrist with 3/4 tone. 3/4 quads, gastrocs. pecs bilaterally 1/4---mild capsulitis. Minimal tone in hip adductors.    Musculoskeletal: Full ROM, No pain with AROM or PROM in the neck, trunk, or extremities. Posture appropriate Psych: Pt's is a bit aggressive, likes to play around/be sarcastic.Marland Kitchen Pt is cooperative        Assessment & Plan:  ASSESSMENT: 1. 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 2. 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 3. Urinary retention and incontinence. 4. Chronic nausea 5.  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 We initiate a trial of dantrium for hypertonicity---titrate up to  TID initially. Consider botox injections, follow up therapy, etc 2. Will reach out to Dr. Dorena Cookey, GI regarding her nausea work up--- I don't believe an MRI is indicated for assessment of this. 3. May need w/c evaluation 4. Made a referral to neuropsych for anxiety/anger mgt  5. Will have her go see [facility] for bilateral WHO. 6. Will see the patient back in approximately four to five weeks' time.

## 2015-07-30 NOTE — Patient Instructions (Signed)
DANTRIUM:    AT BEDTIME FOR 3 DAYS THEN  TWICE DAILY FOR 3 DAYS THEN 25 MG THREE X DAILY THEREAFTER.    I WILL CONTACT DR. Madilyn Fireman ABOUT YOUR NAUSEA WORK UP.

## 2015-08-31 ENCOUNTER — Encounter: Payer: Medicaid Other | Admitting: Physical Medicine & Rehabilitation

## 2015-09-01 ENCOUNTER — Telehealth: Payer: Self-pay | Admitting: Physical Medicine & Rehabilitation

## 2015-09-01 NOTE — Telephone Encounter (Signed)
Sharyl NimrodMeredith (patients private duty RN) called to ask if patient could get bil elbow splints has an appt with [facility] on 10/19 and also would like to know if patient could increase Dantrium patient having increased spasms.  Please call her at [phone] or call the patients house at [phone].

## 2015-09-02 MED ORDER — DANTROLENE SODIUM 50 MG PO CAPS: 50.0000 mg | ORAL_CAPSULE | Freq: Three times a day (TID) | ORAL | Status: DC

## 2015-09-02 NOTE — Telephone Encounter (Signed)
Veronica Huff notified of Dantrium change.  She is wanting to know if she can have elbo splints as well.  They are going to get the wrists splints next week.

## 2015-09-02 NOTE — Telephone Encounter (Signed)
May increase dantrium to 50mg  tid, #90, 3rf.  I requested wrist splints from [facility].  Did she get these?

## 2015-09-03 NOTE — Telephone Encounter (Signed)
Called [name] and told her to increase the dantrium to 50mg  TID. She acknowledged the increase and said Veronica Huff did have quite a few 25mg  left. She also said they did get the wrist splints from [facility] but now they would like an RX sent to [facility] for elbow splints as well. We will have the orders ready for you to sign on Monday.

## 2015-09-03 NOTE — Telephone Encounter (Signed)
Sharyl NimrodMeredith has called again about Dantrium and elbow splints.  Please call her at [phone].

## 2015-09-14 ENCOUNTER — Ambulatory Visit: Payer: Medicaid Other | Attending: Psychology | Admitting: Psychology

## 2015-09-14 ENCOUNTER — Encounter: Payer: Self-pay | Admitting: Psychology

## 2015-09-14 DIAGNOSIS — F419 Anxiety disorder, unspecified: Secondary | ICD-10-CM | POA: Diagnosis not present

## 2015-09-14 NOTE — Progress Notes (Addendum)
[facility]  [address]   Telephone [phone] [address] Fax [phone] [place], [place] [address]  PSYCHOLOGICAL CONSULTATION  *CONFIDENTIAL* This report should not be released without the consent of the client  Name:   Jeronimo NormaAnanda R. Brodrick Date of Birth:  November 19, 1986 [facility] MR#:  [id number] Date of Evaluation: 09/14/15  Reason for Referral Dominique Willeen CassBennett is a 29 year-old woman who was referred for an evaluation of her mood and coping by Faith RogueZachary Swartz, MD of [facility]. Dr. Riley KillSwartz cited anxiety and anger management as areas of concern. Ms. Willeen CassBennett has a history of spastic quadriplegia since she suffered a brainstem stroke when she was 50ele[age] years old.   Sources of Information [facility] were [facility]. Ms. Willeen CassBennett was interviewed.    Chief Complaints & Background  Ms. Willeen CassBennett stated that she requested to be referred to a counselor because "my family is driving me fucking crazy". She reported experiencing ongoing worry about her parents and siblings who have struggled with various mental health issues and/or substance abuse for many years. She stated that she feels like she is the parent to her parents and older siblings. Another source of stress has been her financial situation. 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 She reported that another reason for seeking counseling is that "I like to talk about myself". She frequently mentioned her propensity to be bluntly candid. For example, she freely spoke about being "an openly sexual person" and her polyamorous lifestyle. Other than her worries about her family and financial situation, she described herself as satisfied with her life. She cited having a devoted boyfriend and an active social life. 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 When asked about her anger control, she stated that she can be "a little bitchy sometimes" though did not cite any major life problems due to anger. She reported that she uses marijuana on a regular basis, which helps her to manage anxiety. She denied use of other illicit drugs or abuse of alcohol. She denied symptoms of depression or mood disturbance, including sadness, loss of interests, hopelessness, mania and suicidal thoughts. She denied hallucinations and delusions.   With regards to her health, as might be expected she deals with ongoing medical issues that include neurogenic bowel, chronic trach, sleep apnea and spasticity. She has required total assist with self-care. She has been receiving sixteen hours a day nursing care. Her boyfriend, who is not currently employed, provides care for her when her nurse is not present.  She uses mouth controls to operate her power wheelchair.   She lives with her boyfriend of nine years in [place] housing.  She reported that she had one prior relatively short-lived romantic relationship. She has never been married nor had children.    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 She reported that she had earned an Associate's degree in DentistWeb Technology.    With regards to her mental health history, she recollected having been severely depressed for about five years after her brainstem stroke but not so much afterwards. She has been taking antidepressant medication since her stroke, most recently sertraline. She reported that her last contact with a mental health professional was about six  years ago when she saw a psychological counselor off and on over a period of about one year. She reported that like now, feeling stressed about her family prompted her to seek counseling at that time. She reported no history  of mania, suicidal behavior or psychotic symptoms. She has no history of psychiatric hospitalization.   No past or current legal issues were reported.  Diagnostic Impression Anxiety disorder [F41.9]  Plan Ms. Byland expressed her desire to begin a course of individual psychotherapy. She currently does not appear to be in crisis nor a danger to self or others. She suggested scheduling appointments on a once per month basis. I suggested that we meet again a bit sooner in two weeks in order to develop rapport and gain some traction. She is aware that Medicaid covers eight unmanaged psychotherapy sessions per calendar year.     I have appreciated the opportunity to evaluate Ms. Satterfield.  Please feel free to contact me with any comments or questions.    ______________________ Gladstone Pih, Ph.D Licensed Psychologist

## 2015-09-16 ENCOUNTER — Institutional Professional Consult (permissible substitution): Payer: Medicaid Other | Admitting: Pulmonary Disease

## 2015-09-24 ENCOUNTER — Telehealth: Payer: Self-pay | Admitting: *Deleted

## 2015-09-24 NOTE — Telephone Encounter (Signed)
Upcoming appt with Dr. Riley KillSwartz on Nov 9th.  She says they discussed possible botox injection during last visit.  She is wondering if they can do that during upcoming visit and wondering how long it my add to the appt time so they can give [facility] transportation a valid timeframe for transportation needs

## 2015-09-28 NOTE — Telephone Encounter (Signed)
i don't have plans on doing botox until we decide which muscles, if we can increase orals, etc.

## 2015-09-29 ENCOUNTER — Encounter: Payer: Medicaid Other | Attending: Physical Medicine & Rehabilitation | Admitting: Physical Medicine & Rehabilitation

## 2015-09-29 ENCOUNTER — Encounter: Payer: Self-pay | Admitting: Physical Medicine & Rehabilitation

## 2015-09-29 VITALS — BP 135/79 | HR 84 | Resp 14

## 2015-09-29 DIAGNOSIS — R51 Headache: Secondary | ICD-10-CM | POA: Diagnosis not present

## 2015-09-29 DIAGNOSIS — G8250 Quadriplegia, unspecified: Secondary | ICD-10-CM | POA: Diagnosis not present

## 2015-09-29 DIAGNOSIS — I638 Other cerebral infarction: Secondary | ICD-10-CM

## 2015-09-29 DIAGNOSIS — I639 Cerebral infarction, unspecified: Secondary | ICD-10-CM

## 2015-09-29 DIAGNOSIS — F411 Generalized anxiety disorder: Secondary | ICD-10-CM | POA: Insufficient documentation

## 2015-09-29 DIAGNOSIS — F911 Conduct disorder, childhood-onset type: Secondary | ICD-10-CM | POA: Insufficient documentation

## 2015-09-29 DIAGNOSIS — R519 Headache, unspecified: Secondary | ICD-10-CM

## 2015-09-29 MED ORDER — IMIPRAMINE HCL 50 MG PO TABS: 50.0000 mg | ORAL_TABLET | Freq: Every day | ORAL | Status: DC

## 2015-09-29 MED ORDER — TOPIRAMATE 25 MG PO TABS: 25.0000 mg | ORAL_TABLET | Freq: Every day | ORAL | Status: DC

## 2015-09-29 MED ORDER — DANTROLENE SODIUM 100 MG PO CAPS: 100.0000 mg | ORAL_CAPSULE | Freq: Three times a day (TID) | ORAL | Status: DC

## 2015-09-29 NOTE — Telephone Encounter (Signed)
Called pt's caretaker, passed on Dr. Rosalyn ChartersSwartz's message

## 2015-09-29 NOTE — Patient Instructions (Addendum)
PLEASE CALL ME WITH ANY PROBLEMS OR QUESTIONS (#[phone]).  HAVE A GOOD DAY!   DANTRIUM  50-50-100MG  FOR 3 DAYS THEN 100-50-100MG  FOR 3DAYS THEN 100MG  3X DAILY THEREAFTER   TOPAMAX 25MG  AT NIGHT FOR ONE WEEK, THEN INCREASE TO 50MG  IF HEADACHES PERSIST.

## 2015-09-29 NOTE — Progress Notes (Deleted)
   Subjective:    Patient ID: Veronica Huff, female    DOB: [birth year], 29 y.o.   MRN: [id number]  HPI   .cprmh Review of Systems     Objective:   Physical Exam        Assessment & Plan:

## 2015-09-29 NOTE — Progress Notes (Signed)
Subjective:    Patient ID: Veronica Huff, female    DOB: 01-Jul-1986, 29 y.o.   MRN: [id number]  HPI   Veronica Huff is here in follow-up of her spastic tetraplegia. She initially had good results with the dantrium but the effects waned after about 2.5 weeks. She then developed further clonus and spasticity. We increased to  TID without substantial effect although her symptoms are improved compared to before the dantrium.she's had no SE with the dantrium.  She received both of her splints from [facility]. She is interested in AFO's  Veronica Huff is still interested in botox for tone control.    She continues to have fronto-temporal headaches which are chronic/constant--she has severe sharp headaches 4-5 x per month. She has been placed on imipramine in the past by a headache clinic. She is taking  imipramine qhs.  Her gasses have helped her vision but not her headaches. Veronica Huff is interested in an MRI of her brain because of the ongoing headaches as well as a "Family history" of aneurysms.      Pain Inventory Average Pain 3 Pain Right Now 2 My pain is intermittent, sharp and dull  In the last 24 hours, has pain interfered with the following? General activity 1 Relation with others 1 Enjoyment of life 2 What TIME of day is your pain at its worst? evening Sleep (in general) Good  Pain is worse with: noisy environment Pain improves with: rest and medication Relief from Meds: 5  Mobility use a wheelchair Do you have any goals in this area?  no  Function Do you have any goals in this area?  no  Neuro/Psych bladder control problems bowel control problems spasms anxiety  Prior Studies Any changes since last visit?  no  Physicians involved in your care Any changes since last visit?  yes Psychologist Dr. Leonides Cave   Family History  Problem Relation Age of Onset  . Diabetes Mother   . Hypertension Mother   . Varicose Veins Mother   . Cancer Maternal Aunt   . Diabetes  Maternal Grandfather   . Heart disease Maternal Grandfather   . Heart disease Paternal Grandfather    Social History   Social History  . Marital Status: Single    Spouse Name: N/A  . Number of Children: N/A  . Years of Education: college    Social History Main Topics  . Smoking status: Never Smoker   . Smokeless tobacco: None  . Alcohol Use: No  . Drug Use: No  . Sexual Activity: Yes    Birth Control/ Protection: None   Other Topics Concern  . None   Social History Narrative   Lives at home with boyfriend   Has a RN [name] that helps to care for her    Drinks no caffeine    Past Surgical History  Procedure Laterality Date  . Ileostomy  2006  . Esophagogastroduodenoscopy    . Spine surgery  2004,2011  . Tracheostomy  2000  . Gastrostomy tube placement  2000  . Bronchoscopy  2006  . Throat surgery  2004  . Wisdom tooth extraction  2008  . Bladder surgery  2009,2012  . Tendon repair  2012    Left wrist  . Removal of gastrostomy tube    . Cholecystectomy  11/18/2012    Procedure: LAPAROSCOPIC CHOLECYSTECTOMY;  Surgeon: Axel Filler, MD;  Location: [facility];  Service: General;  Laterality: N/A;   Past Medical History  Diagnosis Date  . CVA (cerebral infarction)  Ischemic brainstem stroke in 2011 at age 57 due to unknown reasons--quadriplegic after stroke.  . Quadriplegia (HCC)     Secondary to stroke at age 85-has trach-lives with boyfriend-motorized w/c-joystick to chin to drive w/c--I & O cath thru suprapubic stoma - has nursing care 16 hrs daily and boyfriend able to help pt the other hours of day.  . Recurrent UTI (urinary tract infection)     Due to suprapubic stoma, repetitive catheterizations  . Sleep apnea     On ventilator at night -has oxygen but rarely needs unless she has a cold and sats drop  . Depression     Zoloft has helped-not having any current depression  . Edema extremities     Feet and legs-mostly in feet  . Headache(784.0)     Prior to  imipramine-none currently   BP 135/79 mmHg  Pulse 84  Resp 14  SpO2 98%  Opioid Risk Score:   Fall Risk Score:  `1  Depression screen PHQ 2/9  Depression screen PHQ 2/9 07/30/2015  Decreased Interest 0  Down, Depressed, Hopeless 0  PHQ - 2 Score 0  Altered sleeping 0  Tired, decreased energy 0  Change in appetite 0  Feeling bad or failure about yourself  0  Trouble concentrating 0  Moving slowly or fidgety/restless 0  Suicidal thoughts 0  PHQ-9 Score 0     Review of Systems  Gastrointestinal: Positive for nausea.       Neurogenic bowels  Genitourinary:       Neurogenic bladder  Musculoskeletal:       Spasms   Psychiatric/Behavioral: The patient is nervous/anxious.   All other systems reviewed and are negative.      Objective:   Physical Exam  General: Alert and oriented x 3, No apparent distress in her powered w/c  HEENT: Head is normocephalic, atraumatic, PERRLA, EOMI, sclera anicteric, oral mucosa pink and moist, dentition intact, ext ear canals clear,  Neck: Supple without JVD or lymphadenopathy, trach with speaking valve.  Heart: Reg rate and rhythm. No murmurs rubs or gallops  Chest: CTA bilaterally without wheezes, rales, or rhonchi; no distress  Abdomen: Soft, non-tender, slightly-distended, bowel sounds positive. Has binder  Extremities: No clubbing, cyanosis, or edema. Pulses are 2+  Skin: Clean and intact without signs of breakdown  Neuro: Pt is cognitively appropriate with normal insight, memory, and awareness. Intact pain and general touch below the neck. No voluntary muscle function in any of her four limbs. DTR's 3+, she has sustained clonus in both lower ext. MAS: 3/4 biceps bilat, right arm remains supinated with wrist extended. tone--2/4. LUE pronated at forearm/wrist with 3/4 tone.1-2/4 quads, gastrocs. pecs bilaterally are 1/4---mild capsulitis. Minimal tone in hip adductors.  Musculoskeletal: Full ROM, No pain with AROM or PROM in the neck, trunk,  or extremities. Posture appropriate  Psych: Pts affect remains a bit aggressive. She is a little disinhibited at times.  Pt is cooperative     Assessment & Plan:   ASSESSMENT:  1. 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  2. 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  3. Urinary retention and incontinence.  4. Chronic nausea  5. 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 Chronic headaches. Has a family hx of aneurysms per pt.   PLAN:  1. Titrate dantrium to  TID over one week.  2. I will consider her request for an MRI in the context of her prior infarct and family hx of  aneurysm.    3. Will set up for botox 400 units to bilateral biceps. Will need follow up HEP/therapy afterwards.   4. Headache rx with topamax 25mg  qhs and then 50mg  qhs in one week.  5. Orthotics per [facility]. Wrote order for bilateral PRAFO's  6. Thirty minutes of face to face patient care time were spent during this visit. All questions were encouraged and answered. Will see the patient back in approximately four weeks' time.

## 2015-10-01 ENCOUNTER — Ambulatory Visit: Payer: Medicaid Other | Admitting: Psychology

## 2015-10-20 ENCOUNTER — Ambulatory Visit: Payer: Medicaid Other | Attending: Psychology | Admitting: Psychology

## 2015-10-20 DIAGNOSIS — F419 Anxiety disorder, unspecified: Secondary | ICD-10-CM | POA: Diagnosis not present

## 2015-10-20 NOTE — Progress Notes (Signed)
[facility]  [address]   Telephone [phone] [address] Fax [phone] [place], [place] [address]   Psychology Progress Note   Name:  Jeronimo Normananda R Garfinkel Date of Birth:  1986/08/18 MRN:  [id number]  Date:10/20/2015 (6655m) psychotherapy She reported that she is in good spirits. She has been less worried about her family members as they have been maintaining sobriety lately. She expressed worry about the "nursing shortage" that she experiences nearly every holiday season.   Diagnostic Impression Anxiety disorder [F41.9]  She expressed appreciation for the opportunity to have a "sounding board".  Return in one month.   Gladstone PihMichael F. Anneke Cundy, Ph.D Licensed Psychologist

## 2015-11-03 ENCOUNTER — Other Ambulatory Visit: Payer: Self-pay | Admitting: Pediatrics

## 2015-11-10 ENCOUNTER — Encounter: Payer: Medicaid Other | Attending: Physical Medicine & Rehabilitation | Admitting: Physical Medicine & Rehabilitation

## 2015-11-10 ENCOUNTER — Encounter: Payer: Self-pay | Admitting: Physical Medicine & Rehabilitation

## 2015-11-10 VITALS — BP 109/61 | HR 86 | Resp 14

## 2015-11-10 DIAGNOSIS — I638 Other cerebral infarction: Secondary | ICD-10-CM | POA: Insufficient documentation

## 2015-11-10 DIAGNOSIS — F411 Generalized anxiety disorder: Secondary | ICD-10-CM | POA: Diagnosis not present

## 2015-11-10 DIAGNOSIS — F911 Conduct disorder, childhood-onset type: Secondary | ICD-10-CM | POA: Insufficient documentation

## 2015-11-10 DIAGNOSIS — G8250 Quadriplegia, unspecified: Secondary | ICD-10-CM

## 2015-11-10 NOTE — Progress Notes (Signed)
Botox Injection for spasticity using needle EMG guidance Indication: spastic tetraplegia  Dilution: 100 Units/ml        Total Units Injected: 400units Indication: Severe spasticity which interferes with ADL,mobility and/or  hygiene and is unresponsive to medication management and other conservative care Informed consent was obtained after describing risks and benefits of the procedure with the patient. This includes bleeding, bruising, infection, excessive weakness, or medication side effects. A REMS form is on file and signed.  Needle: 50mm injectable monopolar needle electrode  Number of units per muscle  Biceps 150 units left and 150 units right Brachioradialis 50 units left and 50 units right All injections were done after obtaining appropriate EMG activity and after negative drawback for blood. The patient tolerated the procedure well. Post procedure instructions were given. A followup appointment was made. A referral was made to neurorehab for w/c evaluation

## 2015-11-10 NOTE — Patient Instructions (Signed)
PLEASE CALL ME WITH ANY PROBLEMS OR QUESTIONS (#[phone]). HAVE A HAPPY HOLIDAY SEASON!!!    

## 2015-11-16 ENCOUNTER — Ambulatory Visit: Payer: Medicaid Other | Attending: Psychology | Admitting: Psychology

## 2015-11-16 DIAGNOSIS — F419 Anxiety disorder, unspecified: Secondary | ICD-10-CM | POA: Diagnosis not present

## 2015-11-16 NOTE — Progress Notes (Signed)
[facility]  [address]   Telephone [phone] [address] Fax [phone]) [phone] [place], [place] [address]   Psychology Progress Note   Name:  Veronica Huff Date of Birth:  November 17, 1986 MRN:  [id number]  Date:11/16/2015 (3632m) psychotherapy Veronica Huff spoke of her emotional distress caused by her mother's inability or refusal to respect rules and boundaries while visiting. She also expressed worry about her mother's health and history of noncompliance with medical advice. Otherwise she described herself to be in good spirits with positive relationships with friends and romantic partners. She is hoping to publish a book of haiku poems in 2017.   Diagnostic Impression Anxiety disorder [F41.9]   Return in one month.  Gladstone PihMichael F. Reis Goga, Ph.D Licensed Psychologist

## 2015-11-25 ENCOUNTER — Other Ambulatory Visit: Payer: Self-pay | Admitting: Pediatrics

## 2015-12-18 ENCOUNTER — Other Ambulatory Visit: Payer: Self-pay | Admitting: Physical Medicine & Rehabilitation

## 2015-12-21 ENCOUNTER — Ambulatory Visit: Payer: Medicaid Other | Attending: Psychology | Admitting: Psychology

## 2015-12-21 DIAGNOSIS — F419 Anxiety disorder, unspecified: Secondary | ICD-10-CM | POA: Insufficient documentation

## 2015-12-21 NOTE — Progress Notes (Signed)
[facility]  [address]   Telephone [phone] [address] Fax [phone] [place], [place] [address]   Psychology Progress Note   Name:  Veronica Huff Date of Birth:  Jul 16, 1986 MRN:  [id number]  Date:12/21/2015 (52m) psychotherapy Veronica Huff reports that she is doing well with respect to her health and emotional status. She is feeling a bit anxious about a new nurse coming onto her care team, mostly about whether her nurse will have a non-judgmental attitude about her lifestyle. She reports a strong social support system. She is actively involved in advocating for disabled persons.    Diagnostic Impression Anxiety [F41.9]  Return in one month.   Gladstone Pih, Ph.D Licensed Psychologist

## 2016-01-05 ENCOUNTER — Encounter: Payer: Self-pay | Admitting: Pulmonary Disease

## 2016-01-05 ENCOUNTER — Ambulatory Visit (INDEPENDENT_AMBULATORY_CARE_PROVIDER_SITE_OTHER): Payer: Medicaid Other | Admitting: Pulmonary Disease

## 2016-01-05 VITALS — BP 114/68 | HR 61 | Wt 120.0 lb

## 2016-01-05 DIAGNOSIS — Z930 Tracheostomy status: Secondary | ICD-10-CM

## 2016-01-05 DIAGNOSIS — J9611 Chronic respiratory failure with hypoxia: Secondary | ICD-10-CM | POA: Diagnosis not present

## 2016-01-05 DIAGNOSIS — Z23 Encounter for immunization: Secondary | ICD-10-CM | POA: Diagnosis not present

## 2016-01-05 DIAGNOSIS — G4733 Obstructive sleep apnea (adult) (pediatric): Secondary | ICD-10-CM

## 2016-01-05 DIAGNOSIS — G8250 Quadriplegia, unspecified: Secondary | ICD-10-CM

## 2016-01-05 NOTE — Patient Instructions (Signed)
Pneumovax shot today  Will try to get lab results from Dr. Albertina Parr office  Will get overnight oxygen test and report for ventilator  Follow up in 2 months

## 2016-01-05 NOTE — Progress Notes (Signed)
Past Medical History She  has a past medical history of CVA (cerebral infarction); Quadriplegia (HCC); Recurrent UTI (urinary tract infection); Sleep apnea; Depression; Edema extremities; and Headache(784.0).  Past Surgical History She  has past surgical history that includes Ileostomy (2006); Esophagogastroduodenoscopy; Spine surgery (0981,1914); Tracheostomy (2000); Gastrostomy tube placement (2000); Bronchoscopy (2006); Throat surgery (2004); Wisdom tooth extraction (2008); Bladder surgery (7829,5621); Tendon repair (2012); Removal of gastrostomy tube; and Cholecystectomy (11/18/2012).  Current Outpatient Prescriptions on File Prior to Visit  Medication Sig  . acetaminophen (TYLENOL) 500 MG tablet Take 500 mg by mouth every 6 (six) hours as needed (pain).  . baclofen (LIORESAL) 20 MG tablet Take 40 mg by mouth 4 (four) times daily.  . bisacodyl (DULCOLAX) 10 MG suppository Place 10 mg rectally every other day as needed. constipation  . bismuth subsalicylate (PEPTO BISMOL) 262 MG/15ML suspension Take 30 mLs by mouth every 6 (six) hours as needed.  . Cranberry 125 MG TABS Take 1-2 tablets by mouth daily as needed.  . dantrolene (DANTRIUM) 100 MG capsule Take 1 capsule (100 mg total) by mouth 3 (three) times daily.  Marland Kitchen Dextromethorphan-Guaifenesin 5-100 MG/5ML LIQD Take 5 mLs by mouth 2 (two) times daily as needed.  . diphenhydrAMINE (SOMINEX) 25 MG tablet Take 25 mg by mouth every 6 (six) hours as needed for sleep.  Marland Kitchen EPIPEN 2-PAK 0.3 MG/0.3ML SOAJ injection USE UTD FOR ALLERGIC REACTION  . fluticasone (FLONASE) 50 MCG/ACT nasal spray SHAKE LIQUID WELL AND USE 2 SPRAYS INTO EACH NOSTRIL DAILY FOR STUFFY NOSE OR DRAINAGE  . guaiFENesin (MUCINEX) 600 MG 12 hr tablet Take 600 mg by mouth 2 (two) times daily as needed (1-2 tab BID PRN).  . hydrocortisone cream 0.5 % Apply 1 application topically 2 (two) times daily. To hairline and brows  . ibuprofen (ADVIL,MOTRIN) 200 MG tablet Take 400 mg by mouth  every 6 (six) hours as needed.  Marland Kitchen imipramine (TOFRANIL) 50 MG tablet Take 1 tablet (50 mg total) by mouth at bedtime.  Marland Kitchen ketoconazole (NIZORAL) 2 % shampoo Apply 1 application topically 2 (two) times a week.  . levocetirizine (XYZAL) 5 MG tablet Take 1 tablet by mouth daily as needed.  . loperamide (IMODIUM A-D) 2 MG tablet Take 4-8 mg by mouth daily as needed for diarrhea or loose stools (not to exceed 4 tablets in 24 hours).  . medroxyPROGESTERone (DEPO-PROVERA) 150 MG/ML injection INJ 1 ML IM Q 13 WEEKS  . methocarbamol (ROBAXIN) 500 MG tablet TAKE 1 TABLET BY MOUTH EVERY 8 HOURS AS NEEDED FOR MUSCLE SPASMS  . Multiple Vitamin (MULTIVITAMIN) tablet Take 1 tablet by mouth daily.  . mupirocin ointment (BACTROBAN) 2 % 1 application by Tracheal Tube route daily. Applies to trach-stoma site  . neomycin-polymyxin B (NEOSPORIN GU IRRIGANT) SOLN 30-40 mLs by GU irrigant route at bedtime. Instilled thru I&O catheter at bedtime-to prevent uti  . nitrofurantoin (MACRODANTIN) 50 MG capsule Take 50 mg by mouth at bedtime.  Marland Kitchen nystatin (MYCOSTATIN) powder Apply topically 2 (two) times daily as needed. Small amount  . PAZEO 0.7 % SOLN INSTILL 1 GTT IN OU D FOR ITCHY EYES  . polyethylene glycol (MIRALAX / GLYCOLAX) packet Take 17 g by mouth daily.  . promethazine (PHENERGAN) 25 MG suppository Place 25 mg rectally every 6 (six) hours as needed for nausea or vomiting.  . Pseudoeph-Hydrocodone (PSEUDOEPHEDRINE-HYDROCODONE PO) Take 1 tablet by mouth daily as needed.  . sertraline (ZOLOFT) 100 MG tablet Take 100 mg by mouth at bedtime.  . solifenacin (  VESICARE) 10 MG tablet Take 10 mg by mouth daily. Every am  . sucralfate (CARAFATE) 1 GM/10ML suspension Take 1 g by mouth 3 (three) times daily.   Marland Kitchen topiramate (TOPAMAX) 25 MG tablet TAKE 1 TO 2 TABLETS(25 TO 50 MG) BY MOUTH AT BEDTIME   No current facility-administered medications on file prior to visit.    Allergies  Allergen Reactions  . Bee Venom Shortness  Of Breath and Swelling    Anaphylactic shock  . Ciprofloxacin     hives  . Erythromycin   . Morphine And Related Other (See Comments)    Goes crazy  . Tobramycin   . Trazodone And Nefazodone     Family History Her family history includes Cancer in her maternal aunt; Diabetes in her maternal grandfather and mother; Heart disease in her maternal grandfather and paternal grandfather; Hypertension in her mother; Varicose Veins in her mother.  Social History She  reports that she has never smoked. She does not have any smokeless tobacco history on file. She reports that she does not drink alcohol or use illicit drugs.  Review of systems Review of Systems  Constitutional: Negative for fever and unexpected weight change.  HENT: Negative for congestion, dental problem, ear pain, nosebleeds, postnasal drip, rhinorrhea, sinus pressure, sneezing, sore throat and trouble swallowing.   Eyes: Negative for redness and itching.  Respiratory: Negative for cough, chest tightness, shortness of breath and wheezing.   Cardiovascular: Positive for leg swelling ( feet swelling). Negative for palpitations.  Gastrointestinal: Positive for nausea ( chronic). Negative for vomiting.  Genitourinary: Negative for dysuria.  Musculoskeletal: Negative for joint swelling.  Skin: Negative for rash.  Neurological: Negative for headaches.  Hematological: Does not bruise/bleed easily.  Psychiatric/Behavioral: Negative for dysphoric mood. The patient is not nervous/anxious.     Chief Complaint  Patient presents with  . Sleep Consult    Referred by Dr Concepcion Elk. Sleep Study x 10-15 years ago with Dr Maple Hudson. Epworth Score: 9    Tests:  Vital signs BP 114/68 mmHg  Pulse 61  Wt 120 lb (54.432 kg)  SpO2 100%  History of Present Illness Veronica Huff is a 30 y.o. female with chronic respiratory failure s/p tracheostomy with nocturnal ventilatory support.  She is accompanied by her nurse.  She had a brainstem  stroke at age 33.  As a result she is quadriplegic.  She has chronic tracheostomy, and this is being managed by Dr. Jenne Pane with ENT.  She has been on chronic nocturnal ventilatory support.  She was being managed at [facility], but has not been seen there is some time.  She can shrug her shoulders, and has a reasonable cough.  She does not have and additional movement of her limbs.  She has normal sensation.  She has #5 cuffless pediatric tracheostomy, and this is changed every two weeks by her care team.  She requires a wheelchair for mobility.  She had a sleep study done years ago, and was told she had sleep apnea.  She goes to bed at 2 am.  She falls asleep after 15 to 30 minutes.  She sleeps through the night.  She wakes up at 10 am.  She feels rested during the day.  She is not using anything to help her sleep or stay awake.  The Epworth score is 9 out of 24.  Physical Exam:  General - pleasant, in wheelchair ENT - No sinus tenderness, no oral exudate, tracheostomy stoma in place, normal phonation Cardiac -  s1s2 regular, no murmur, pulses symmetric Chest - No wheeze/rales/dullness, good air entry, normal respiratory excursion Back - No focal tenderness Abd - Soft, non-tender, no organomegaly, + bowel sounds Ext - No edema Neuro - alert, follows commands, able to shrug shoulders but otherwise no movement Skin - No rashes Psych - Normal mood, and behavior   CMP Latest Ref Rng 07/05/2013 11/18/2012 07/26/2007  Glucose 70 - 99 mg/dL 95 85 161(W)  BUN 6 - 23 mg/dL 15 13 8   Creatinine 0.50 - 1.10 mg/dL 9.60 4.54(U) 9.81  Sodium 135 - 145 mEq/L 136 134(L) 138  Potassium 3.5 - 5.1 mEq/L 3.9 4.4 3.9  Chloride 96 - 112 mEq/L 99 97 101  CO2 19 - 32 mEq/L 27 29 30   Calcium 8.4 - 10.5 mg/dL 9.2 9.3 9.4  Total Protein 6.0 - 8.3 g/dL 6.9 - -  Total Bilirubin 0.3 - 1.2 mg/dL 0.3 - -  Alkaline Phos 39 - 117 U/L 78 - -  AST 0 - 37 U/L 17 - -  ALT 0 - 35 U/L 14 - -    CBC Latest Ref Rng 07/05/2013  11/18/2012 08/12/2007  WBC 4.0 - 10.5 K/uL 8.4 4.6 6.0  Hemoglobin 12.0 - 15.0 g/dL 19.1 47.8 29.5  Hematocrit 36.0 - 46.0 % 36.2 40.1 36.6  Platelets 150 - 400 K/uL 125(L) 115(L) 158    Discussion: 30 yo female with chronic respiratory failure and sleep disordered breathing 2nd to brain stem infarct at age 30 with resulting quadriplegia.  Assessment/plan:  Sleep disordered breathing with hx of obstructive sleep apnea. Chronic hypoxic respiratory failure. Plan: - will get most recent lab results from PCP office >> specifically would like to assess her HCO3 to determine if she is retaining carbon dioxide - she is to f/u with Dr. Jenne Pane with ENT for tracheostomy care - will get copy of her ventilator settings from her DME - will arrange for overnight oximetry with tracheostomy and nocturnal ventilator - depending on above results will determine if she needs repeat sleep study at this time >> given her requirements for assistance with ADLs, she might need to have referral to tertiary center to have sleep study if needed    Patient Instructions  Pneumovax shot today  Will try to get lab results from Dr. Albertina Parr office  Will get overnight oxygen test and report for ventilator  Follow up in 2 months     Coralyn Helling, M.D. Pager [phone]

## 2016-01-05 NOTE — Progress Notes (Signed)
   Subjective:    Patient ID: Veronica Huff, female    DOB: Apr 20, 1986, 30 y.o.   MRN: [id number]  HPI    Review of Systems  Constitutional: Negative for fever and unexpected weight change.  HENT: Negative for congestion, dental problem, ear pain, nosebleeds, postnasal drip, rhinorrhea, sinus pressure, sneezing, sore throat and trouble swallowing.   Eyes: Negative for redness and itching.  Respiratory: Negative for cough, chest tightness, shortness of breath and wheezing.   Cardiovascular: Positive for leg swelling ( feet swelling). Negative for palpitations.  Gastrointestinal: Positive for nausea ( chronic). Negative for vomiting.  Genitourinary: Negative for dysuria.  Musculoskeletal: Negative for joint swelling.  Skin: Negative for rash.  Neurological: Negative for headaches.  Hematological: Does not bruise/bleed easily.  Psychiatric/Behavioral: Negative for dysphoric mood. The patient is not nervous/anxious.        Objective:   Physical Exam        Assessment & Plan:

## 2016-01-07 ENCOUNTER — Other Ambulatory Visit: Payer: Self-pay | Admitting: Pediatrics

## 2016-01-07 MED ORDER — OLOPATADINE HCL 0.7 % OP SOLN: 1.0000 [drp] | Freq: Two times a day (BID) | OPHTHALMIC | Status: DC

## 2016-01-07 NOTE — Telephone Encounter (Signed)
Pazeo sent to patients pharmacy and patient notified. Per Dr Beaulah Dinning patient is to take 1 drop bid.

## 2016-01-07 NOTE — Telephone Encounter (Signed)
Pt called and needs a new rx for the eye drops. She said the way she is using them they only last 15 days instead of 30 days

## 2016-01-11 ENCOUNTER — Encounter: Payer: Medicaid Other | Attending: Physical Medicine & Rehabilitation | Admitting: Physical Medicine & Rehabilitation

## 2016-01-11 ENCOUNTER — Encounter: Payer: Self-pay | Admitting: Physical Medicine & Rehabilitation

## 2016-01-11 VITALS — BP 110/68 | HR 85 | Resp 16

## 2016-01-11 DIAGNOSIS — F911 Conduct disorder, childhood-onset type: Secondary | ICD-10-CM | POA: Insufficient documentation

## 2016-01-11 DIAGNOSIS — I638 Other cerebral infarction: Secondary | ICD-10-CM | POA: Diagnosis present

## 2016-01-11 DIAGNOSIS — G8250 Quadriplegia, unspecified: Secondary | ICD-10-CM | POA: Insufficient documentation

## 2016-01-11 DIAGNOSIS — R51 Headache: Secondary | ICD-10-CM | POA: Diagnosis not present

## 2016-01-11 DIAGNOSIS — F411 Generalized anxiety disorder: Secondary | ICD-10-CM | POA: Diagnosis not present

## 2016-01-11 DIAGNOSIS — R519 Headache, unspecified: Secondary | ICD-10-CM

## 2016-01-11 MED ORDER — TOPIRAMATE 25 MG PO TABS: 25.0000 mg | ORAL_TABLET | Freq: Every day | ORAL | Status: DC

## 2016-01-11 MED ORDER — TOPIRAMATE 25 MG PO TABS
25.0000 mg | ORAL_TABLET | Freq: Every day | ORAL | Status: DC
Start: 2016-01-11 — End: 2016-01-21

## 2016-01-11 NOTE — Progress Notes (Signed)
Subjective:    Patient ID: Veronica Huff, female    DOB: [birth year], 30 y.o.   MRN: [id number]  HPI  Veronica Huff is here in follow up of her cervical SCI and associated spastic tetraplegia. She had good results with the botox in her upper extremities. She is noticing substantial spasms in the lower exts, particularly her hamstrings with transfers and when she lies down in bed with legs extended. She has ongoing clonus which hasn't changed.   She had great results with the topamax for her headaches. She is only requiring the  dose currently.   She is awaiting a w/c evalution at [facility]. She would also like some compression stockings    Pain Inventory Average Pain 0 Pain Right Now 0 My pain is no pain  In the last 24 hours, has pain interfered with the following? General activity 0 Relation with others 0 Enjoyment of life 0 What TIME of day is your pain at its worst? no pain Sleep (in general) Good  Pain is worse with: no pain Pain improves with: no pain Relief from Meds: no pain  Mobility use a wheelchair needs help with transfers  Function disabled: date disabled .  Neuro/Psych bladder control problems bowel control problems spasms  Prior Studies Any changes since last visit?  no bladder scan  Physicians involved in your care Any changes since last visit?  yes pulmonologist -  Veronica Huff   Family History  Problem Relation Age of Onset  . Diabetes Mother   . Hypertension Mother   . Varicose Veins Mother   . Cancer Maternal Aunt   . Diabetes Maternal Grandfather   . Heart disease Maternal Grandfather   . Heart disease Paternal Grandfather    Social History   Social History  . Marital Status: Single    Spouse Name: N/A  . Number of Children: N/A  . Years of Education: college    Occupational History  . disabled    Social History Main Topics  . Smoking status: Never Smoker   . Smokeless tobacco: None  . Alcohol Use: No  . Drug Use: No   . Sexual Activity: Yes    Birth Control/ Protection: None   Other Topics Concern  . None   Social History Narrative   Lives at home with boyfriend   Has a RN Veronica Huff that helps to care for her    Drinks no caffeine    Past Surgical History  Procedure Laterality Date  . Ileostomy  2006  . Esophagogastroduodenoscopy    . Spine surgery  2004,2011  . Tracheostomy  2000  . Gastrostomy tube placement  2000  . Bronchoscopy  2006  . Throat surgery  2004  . Wisdom tooth extraction  2008  . Bladder surgery  2009,2012  . Tendon repair  2012    Left wrist  . Removal of gastrostomy tube    . Cholecystectomy  11/18/2012    Procedure: LAPAROSCOPIC CHOLECYSTECTOMY;  Surgeon: Veronica Filler, MD;  Location: [facility];  Service: General;  Laterality: N/A;   Past Medical History  Diagnosis Date  . CVA (cerebral infarction)     Ischemic brainstem stroke in 2011 at age 41 due to unknown reasons--quadriplegic after stroke.  . Quadriplegia (HCC)     Secondary to stroke at age 37-has trach-lives with boyfriend-motorized w/c-joystick to chin to drive w/c--I & O cath thru suprapubic stoma - has nursing care 16 hrs daily and boyfriend able to help pt the other hours  of day.  . Recurrent UTI (urinary tract infection)     Due to suprapubic stoma, repetitive catheterizations  . Sleep apnea     On ventilator at night -has oxygen but rarely needs unless she has a cold and sats drop  . Depression     Zoloft has helped-not having any current depression  . Edema extremities     Feet and legs-mostly in feet  . Headache(784.0)     Prior to imipramine-none currently   BP 110/68 mmHg  Pulse 85  Resp 16  SpO2 99%  Opioid Risk Score:   Fall Risk Score:  `1  Depression screen PHQ 2/9  Depression screen PHQ 2/9 07/30/2015  Decreased Interest 0  Down, Depressed, Hopeless 0  PHQ - 2 Score 0  Altered sleeping 0  Tired, decreased energy 0  Change in appetite 0  Feeling bad or failure about yourself  0    Trouble concentrating 0  Moving slowly or fidgety/restless 0  Suicidal thoughts 0  PHQ-9 Score 0     Review of Systems  All other systems reviewed and are negative.      Objective:   Physical Exam  General: Alert and oriented x 3, No apparent distress in her powered w/c  HEENT: Head is normocephalic, atraumatic, PERRLA, EOMI, sclera anicteric, oral mucosa pink and moist, dentition intact, ext ear canals clear,  Neck: Supple without JVD or lymphadenopathy, trach with speaking valve.  Heart: Reg rate and rhythm. No murmurs rubs or gallops  Chest: CTA bilaterally without wheezes, rales, or rhonchi; no distress  Abdomen: Soft, non-tender, slightly-distended, bowel sounds positive. Has binder  Extremities: No clubbing, cyanosis, or edema. Pulses are 2+  Skin: Clean and intact without signs of breakdown  Neuro: Pt is cognitively appropriate with normal insight, memory, and awareness. Intact pain and general touch below the neck. No voluntary muscle function in any of her four limbs. DTR's 3+, she has sustained clonus in both lower ext. MAS: 1-2/4 biceps bilat with contracture at end rom, right arm remains supinated with wrist extended. tone--2/4. LUE pronated at forearm/wrist with 3/4 tone.1/4 quads, gastrocs. Hamstrings also 1+. Clonus sustained in each lower extremity with ROM. pecs bilaterally are 1/4---mild capsulitis. Minimal tone in hip adductors.  Musculoskeletal: Full ROM, No pain with AROM or PROM in the neck, trunk, or extremities. Posture appropriate  Psych: Pts affect appropriate. Pt is cooperative    Assessment & Plan:   ASSESSMENT:  1. 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  2. 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  3. Urinary retention and incontinence.  4. Chronic nausea  5. 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 Chronic headaches. Has a family hx of aneurysms per pt.   PLAN:  1.  Continue dantrium to  TID over one week.  2. I will consider in the future her request for an MRI in the context of her prior infarct and family hx of aneurysm.  3. Will set up for botox 400 units to bilateral hamstrings.   4. Topamax effective---stay at the  qhs dose.  5. W/C assessment pending. Is in need of a new powered chair. have ordered custom knee high teds per [employer] 20-34mm pressure 6. 15 minutes of face to face patient care time were spent during this visit. All questions were encouraged and answered. Will see the patient back in approximately four weeks' time for botox.

## 2016-01-11 NOTE — Patient Instructions (Signed)
  PLEASE CALL ME WITH ANY PROBLEMS OR QUESTIONS (#[phone]).      

## 2016-01-13 ENCOUNTER — Other Ambulatory Visit (HOSPITAL_COMMUNITY): Payer: Self-pay | Admitting: Internal Medicine

## 2016-01-13 DIAGNOSIS — R1310 Dysphagia, unspecified: Secondary | ICD-10-CM

## 2016-01-17 ENCOUNTER — Encounter: Payer: Self-pay | Admitting: Pulmonary Disease

## 2016-01-17 DIAGNOSIS — J9610 Chronic respiratory failure, unspecified whether with hypoxia or hypercapnia: Secondary | ICD-10-CM | POA: Insufficient documentation

## 2016-01-18 ENCOUNTER — Ambulatory Visit: Payer: Medicaid Other | Attending: Psychology | Admitting: Psychology

## 2016-01-18 DIAGNOSIS — F419 Anxiety disorder, unspecified: Secondary | ICD-10-CM

## 2016-01-18 NOTE — Progress Notes (Signed)
[facility]  [address]   Telephone [phone] [address] Fax [phone] [place], [place] [address]   Psychology Progress Note   Name:  Veronica Huff Date of Birth:  05/23/1986 MRN:  [id number]  Date:01/18/2016 (41m) psychotherapy [name] is dealing with the sudden death of her eldest sister last week due to a heroin overdose. She seems to be coping well.  Diagnostic Impression Anxiety [F41.9]  Return on 02/15/16.  Gladstone Pih, Ph.D Licensed Psychologist

## 2016-01-20 ENCOUNTER — Other Ambulatory Visit: Payer: Self-pay | Admitting: Pediatrics

## 2016-01-20 ENCOUNTER — Other Ambulatory Visit: Payer: Self-pay | Admitting: Physical Medicine & Rehabilitation

## 2016-01-20 ENCOUNTER — Telehealth: Payer: Self-pay | Admitting: Allergy

## 2016-01-20 NOTE — Telephone Encounter (Signed)
Left message for patient that xyzal was sent to [facility]. [id number] [place] [address]

## 2016-01-21 ENCOUNTER — Other Ambulatory Visit: Payer: Self-pay | Admitting: *Deleted

## 2016-01-21 DIAGNOSIS — R519 Headache, unspecified: Secondary | ICD-10-CM

## 2016-01-21 DIAGNOSIS — G8250 Quadriplegia, unspecified: Secondary | ICD-10-CM

## 2016-01-21 DIAGNOSIS — R51 Headache: Secondary | ICD-10-CM

## 2016-01-21 MED ORDER — TOPIRAMATE 25 MG PO TABS: 25.0000 mg | ORAL_TABLET | Freq: Every day | ORAL | Status: DC

## 2016-01-25 ENCOUNTER — Other Ambulatory Visit: Payer: Self-pay

## 2016-01-25 MED ORDER — OLOPATADINE HCL 0.7 % OP SOLN
1.0000 [drp] | Freq: Two times a day (BID) | OPHTHALMIC | Status: DC
Start: 2016-01-25 — End: 2016-07-25

## 2016-01-26 ENCOUNTER — Ambulatory Visit: Payer: Medicaid Other | Admitting: Rehabilitative and Restorative Service Providers"

## 2016-02-01 ENCOUNTER — Ambulatory Visit (HOSPITAL_COMMUNITY): Payer: Medicaid Other

## 2016-02-01 ENCOUNTER — Inpatient Hospital Stay (HOSPITAL_COMMUNITY): Admission: RE | Admit: 2016-02-01 | Payer: Medicaid Other | Source: Ambulatory Visit

## 2016-02-09 ENCOUNTER — Telehealth: Payer: Self-pay | Admitting: Pulmonary Disease

## 2016-02-09 NOTE — Telephone Encounter (Signed)
Spoke with Veronica Huff with [facility]  She is asking if ONO needs to be done on just vent  I advised yes- according to order ONO on home vent  She verbalized understanding and nothing further needed

## 2016-02-10 ENCOUNTER — Encounter: Payer: Self-pay | Admitting: Physical Therapy

## 2016-02-10 ENCOUNTER — Ambulatory Visit: Payer: Medicaid Other | Attending: Physical Medicine & Rehabilitation | Admitting: Physical Therapy

## 2016-02-10 DIAGNOSIS — M6289 Other specified disorders of muscle: Secondary | ICD-10-CM | POA: Diagnosis not present

## 2016-02-10 DIAGNOSIS — G8250 Quadriplegia, unspecified: Secondary | ICD-10-CM | POA: Diagnosis present

## 2016-02-10 NOTE — Therapy (Signed)
[facility] [address] [place], [place], [address] Phone: [phone]   Fax:  [phone]  Physical Therapy Evaluation  Patient Details  Name: Veronica Huff MRN: [id number] Date of Birth: August 05, 1986 No Data Recorded  Encounter Date: 02/10/2016      PT End of Session - 02/10/16 1615    Visit Number 1   Number of Visits 1   PT Start Time 5277   PT Stop Time 1600   PT Time Calculation (min) 75 min   Equipment Utilized During Treatment Gait belt   Activity Tolerance Patient tolerated treatment well   Behavior During Therapy [facility] for tasks assessed/performed      Past Medical History  Diagnosis Date  . 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  . Quadriplegia ([place])     Secondary to stroke at age 89-has trach-lives with boyfriend-motorized w/c-joystick to chin to drive w/c--I & O cath thru suprapubic stoma - has nursing care 16 hrs daily and boyfriend able to help pt the other hours of day.  . Recurrent UTI (urinary tract infection)     Due to suprapubic stoma, repetitive catheterizations  . Sleep apnea     On ventilator at night -has oxygen but rarely needs unless she has a cold and sats drop  . Depression     Zoloft has helped-not having any current depression  . Edema extremities     Feet and legs-mostly in feet  . Headache(784.0)     Prior to imipramine-none currently    Past Surgical History  Procedure Laterality Date  . Ileostomy  2006  . Esophagogastroduodenoscopy    . Spine surgery  2004,2011  . Tracheostomy  2000  . Gastrostomy tube placement  2000  . Bronchoscopy  2006  . Throat surgery  2004  . Wisdom tooth extraction  2008  . Bladder surgery  2009,2012  . Tendon repair  2012    Left wrist  . Removal of gastrostomy tube    . Cholecystectomy  11/18/2012    Procedure: LAPAROSCOPIC CHOLECYSTECTOMY;  Surgeon:  Ralene Ok, MD;  Location: [facility];  Service: General;  Laterality: N/A;    There were no vitals filed for this visit.  Visit Diagnosis:  Abnormal increased muscle tone  Spastic tetraplegia (HCC)      Subjective Assessment - 02/10/16 1456    Subjective (p) She presents for power w/c evaluation.    Patient is accompained by: (p) Family member   Patient Stated Goals (p) to get new power w/c      Mobility/Seating Evaluation    PATIENT INFORMATION: Name: Veronica Huff DOB: 04/30/1996  Sex: Female Date seen: 02/10/2016 Time: 2:45  Address:  [address] [place] [address] Physician: Alger Simons, MD This evaluation/justification form will serve as the LMN for the following suppliers: __________________________ Supplier: [employer] Contact Person: Deberah Pelton, Wess Botts Phone:  [phone]   Seating Therapist: Jamey Reas, PT, DPT Phone:   [phone]   Phone: [phone]    Spouse/Parent/Caregiver name: Allena Earing, boyfriend  Phone number: [phone] Insurance/Payer: Medicaid     Reason for Referral: To get new power wheelchair  Patient/Caregiver Goals: to get new power wheelchair to get around house  Patient was seen for face-to-face evaluation for replacement power wheelchair.  Also present was Allena Earing, boyfriend, Deberah Pelton, [employer] to discuss recommendations and wheelchair options.  Further paperwork was completed and sent  to vendor.  Patient appears to qualify for power mobility device at this time per objective findings.   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 Onset: 2000 Diagnosis: Edema in feet & legs, Respiratory failure, Trachea   []Progressive Disease Relevant past and future surgeries: Spine surgery Scoliosis 2004, infected so removal of hardware 2011, Ileostomy 2006, Left wrist tendon release 2012   Height: 5'2" Weight: 120# Explain recent changes or trends in weight: none   History including Falls:  none    HOME ENVIRONMENT: []House  []Condo/town home  [x]Apartment  []Assisted Living    [x]Lives Alone [] Lives with Others                                                                                          Hours with caregiver: 16 hrs/day caregiver & boyfriend lives with her  [x]Home is accessible to patient           Stairs      []Yes [x] No     Ramp []Yes [x]No Comments:  Handicap apartment   COMMUNITY ADL: TRANSPORTATION: []Car    []Van    [x]Public Transportation    []Adapted w/c Lift    []Ambulance    []Other:       [x]Sits in wheelchair during transport  Employment/School: none Specific requirements pertaining to mobility ?????  Other: ?????    FUNCTIONAL/SENSORY PROCESSING SKILLS:  Handedness:   [x]Right     []Left    []NA  Comments:  ?????  Functional Processing Skills for Wheeled Mobility [x]Processing Skills are adequate for safe wheelchair operation  Areas of concern than may interfere with safe operation of wheelchair Description of problem   [] Attention to environment      []Judgment      [] Hearing  [] Vision or visual processing      []Motor Planning  [] Fluctuations in Behavior  ?????    VERBAL COMMUNICATION: [x]WFL receptive [x] WFL expressive [x]Understandable  []Difficult to understand  []non-communicative [] Uses an augmented communication device  CURRENT SEATING / MOBILITY: Current Mobility Base:  []None []Dependent []Manual []Scooter [x]Power  Type of Control: C300 Permobil  Manufacturer:  PermobilSize:  19" X 19"Age: >5 years  Current Condition of Mobility Base:  numerous repairs to electronics, ICS master model, new cushion, scratches on wheelchair, attendant control is off,  gear box is making noise & inconsistent   Current Wheelchair components:  Chin control, power tilt & recline, seat elevator,   Describe posture in present seating system:  sacral sitting, weight shifted to right, feet crossed,      SENSATION and SKIN  ISSUES: Sensation [x]Intact  []Impaired []Absent  Level of sensation: ????? Pressure Relief: Able to perform effective pressure relief :    []Yes  [x] No Method: ???? If not, Why?: uses power tilt & recline  Skin Issues/Skin Integrity Current Skin Issues  []Yes [x]No []Intact [] Red area[] Open Area  []Scar Tissue []At risk from prolonged sitting Where  ?????  History of Skin Issues  [x]Yes []No Where  back of head  When  2000  Hx of skin  flap surgeries  []Yes [x]No Where  ????? When  ?????  Limited sitting tolerance []Yes [x]No Hours spent sitting in wheelchair daily: 16 hrs /day  Complaint of Pain:  Please describe: none   Swelling/Edema: swelling of ankles & feet but controls as able with tilt & recline, needs power leg rests to control adequately   ADL STATUS (in reference to wheelchair use):  Indep Assist Unable Indep with Equip Not assessed Comments  Dressing ????? ????? X ????? ????? caregiver performs in bed  Eating ????? ????? X ????? ????? breakfast in bed, otherwise in w/c  Toileting ????? ????? X ????? ????? in & out cath, suppository every other night  Bathing ????? ????? X ????? ????? sponge bath in bed  Grooming/Hygiene ????? ????? X ????? ????? caregiver performs in w/c  Meal Prep ????? ????? X ????? ????? caregivers & boyfriend /family  IADLS ????? ????? X ????? ????? performs herself on computer  Bowel Management: []Continent  [x]Incontinent  [x]Accidents Comments:  suppositories  Bladder Management: []Continent  [x]Incontinent  []Accidents Comments:  In & Out catheter     WHEELCHAIR SKILLS: Manual w/c Propulsion: []UE or LE strength and endurance sufficient to participate in ADLs using manual wheelchair Arm : []left []right   []Both      Distance: ????? Foot:  []left []right   []Both  Operate Scooter: [] Strength, hand grip, balance and transfer appropriate for use []Living environment is accessible for use of scooter  Operate Power w/c:  [] Std. Joystick    [x] Alternative Controls Indep [x] Assist [] Dependent/unable [] N/A []  [x]Safe          [x] Functional      Distance: ?????  Bed confined without wheelchair [x] Yes [] No   STRENGTH/RANGE OF MOTION:  Passive Range of Motion Strength  Shoulder Right flexion & abduction 90*, left flexion 105, abduction 90 1/5   Elbow extension right -76*, left -90*,  pronation right neutral only, left -20* from neutral 0/5  Wrist/Hand flexion to neutral, left fused surgically to neutral fingers right index, ring & pinky flexed, middle extended, left flexion at MCP  0/5  Hip flexion bilateral >100*, extension reports lies flat in bed 0/5  Knee extension full with stretch 0/5  Ankle dorsiflexion right -15*, left -20* 0/5     MOBILITY/BALANCE:  [x] Patient is totally dependent for mobility  ?????    Balance Transfers Ambulation  Sitting Balance: Standing Balance: [] Independent [] Independent/Modified Independent  [] WFL     [] [facility] [] Supervision [] Supervision  [] Uses UE for balance  [] Supervision [] Min Assist [] Ambulates with Assist  ?????    [] Min Assist [] Min assist [] Mod Assist [] Ambulates with Device:      [] RW  [] StW  [] Cane  [] ?????  [] Mod Assist [] Mod assist [] Max assist   [] Max Assist [] Max assist [] Dependent [] Indep. Short Distance Only  [] Unable [] Unable [] Lift / Sling Required Distance (in feet)  ?????   [] Sliding board [] Unable to Ambulate (see explanation below)  Cardio Status:  [x]Intact  [] Impaired   [] NA     ?????  Respiratory Status:  []Intact   [x]Impaired   []NA     trachea, vent at night for sleep apnea,   Orthotics/Prosthetics: wears foot splints some times  Comments (Address manual vs power w/c vs scooter): ?????  Anterior / Posterior Obliquity Rotation-Pelvis spine fused with surgery  PELVIS    [] [] [x]  Neutral Posterior Anterior  [] [] [x]  WFL Rt elev Lt elev  [] [] [x]  WFL Right Left                      Anterior    Anterior      [] Fixed [] Other [x] Partly Flexible [] Flexible   [] Fixed [] Other [x] Partly Flexible  [] Flexible  [] Fixed [] Other [x] Partly Flexible  [] Flexible   TRUNK  [] [] [x]  WFL ? Thoracic ? Lumbar  Kyphosis Lordosis  [] [] []  WFL Convex Convex  Right Left []c-curve []s-curve []multiple  [x] Neutral [] Left-anterior [] Right-anterior     [] Fixed [] Flexible [x] Partly Flexible [] Other  [] Fixed [] Flexible [] Partly Flexible [] Other  [] Fixed             [] Flexible [] Partly Flexible [] Other    Position Windswept  ?????  HIPS          [x]           []              []   Neutral       Abduct        ADduct         [x]          []           []  Neutral Right           Left      [] Fixed [] Subluxed [x] Partly Flexible [] Dislocated [] Flexible  [] Fixed [] Other [x] Partly Flexible  [] Flexible                 Foot Positioning Knee Positioning  ?????    [] WFL  []Lt []Rt [x] [facility]  []Lt []Rt    KNEES ROM concerns: ROM concerns:    & Dorsi-Flexed []Lt []Rt ?????    FEET Plantar Flexed [x]Lt [x]Rt      Inversion                 [x]Lt [x]Rt      Eversion                 []Lt []Rt     HEAD [x] Functional [] Good Head Control  ?????  & [] Flexed         [] Extended [x] Adequate Head Control    NECK [] Rotated  Lt  [] Lat Flexed Lt [] Rotated  Rt [] Lat Flexed Rt [] Limited Head Control     [] Cervical Hyperextension [] Absent  Head Control     SHOULDERS ELBOWS WRIST& HAND right wrist extended, supinated with flat hand left wrist fused at neutral with thumb adducted, fingers flexed at MDP, extended at IPs      Left     Right    Left     Right    Left     Right   U/E []Functional           []Functional flexed at 90* flexed at 90* []Fisting             []Fisting      [x]elev   []dep      []  elev   []dep       []pro -[]retract     []pro  []retract []subluxed             []subluxed           Goals for Wheelchair Mobility  [x] Independence with mobility in  the home with motor related ADLs (MRADLs)  [x] Independence with MRADLs in the community [x] Provide dependent mobility  [x] Provide recline     [x]Provide tilt   Goals for Seating system [x] Optimize pressure distribution [x] Provide support needed to facilitate function or safety [x] Provide corrective forces to assist with maintaining or improving posture [x] Accommodate client's posture:   current seated postures and positions are not flexible or will not tolerate corrective forces [x] Client to be independent with relieving pressure in the wheelchair [x]Enhance physiological function such as breathing, swallowing, digestion  Simulation ideas/Equipment trials:????? State why other equipment was unsuccessful:?????   MOBILITY BASE RECOMMENDATIONS and JUSTIFICATION: MOBILITY COMPONENT JUSTIFICATION  Manufacturer: PermobilModel: F3   Size: Width 17"Seat Depth 20" [x]provide transport from point A to B      [x]promote Indep mobility  [x]is not a safe, functional ambulator [x]walker or cane inadequate [x]non-standard width/depth necessary to accommodate anatomical measurement [] ?????  []Manual Mobility Base []non-functional ambulator    []Scooter/POV  []can safely operate  []can safely transfer   []has adequate trunk stability  []cannot functionally propel manual w/c  [x]Power Mobility Base  [x]non-ambulatory  [x]cannot functionally propel manual wheelchair  [x] cannot functionally and safely operate scooter/POV [x]can safely operate and willing to  []Stroller Base []infant/child  []unable to propel manual wheelchair []allows for growth []non-functional ambulator []non-functional UE []Indep mobility is not a goal at this time  [x]Tilt  []Forward [x]Backward [x]Powered tilt  []Manual tilt  [x]change position against gravitational force on head and shoulders  [x]change position for pressure relief/cannot weight shift [x]transfers  [x]management of tone [x]rest  periods [x]control edema [x]facilitate postural control  [] ?????  [x]Recline  [x]Power recline on power base []Manual recline on manual base  [x]accommodate femur to back angle  [x]bring to full recline for ADL care  [x]change position for pressure relief/cannot weight shift [x]rest periods [x]repositioning for transfers or clothing/diaper /catheter changes []head positioning  []Lighter weight required []self- propulsion  []lifting [] ?????  []Heavy Duty required []user weight greater than 250# []extreme tone/ over active movement []broken frame on previous chair [] ?????  [x] Back  [] Angle Adjustable [] Custom molded corpus back [x]postural control []control of tone/spasticity []accommodation of range of motion [x]UE functional control [x]accommodation for seating system [] ????? [x]provide lateral trunk support []accommodate deformity [x]provide posterior trunk support []provide lumbar/sacral support [x]support trunk in midline [x]Pressure relief over spinal processes  [x] Seat Cushion Jay2 []impaired sensation  []decubitus ulcers present []history of pressure ulceration [x]prevent pelvic extension [x]low maintenance  [x]stabilize pelvis  []accommodate obliquity [x]accommodate multiple deformity [x]neutralize lower extremity position [x]increase pressure distribution [] ?????  [x] Pelvic/thigh support  [x] Lateral thigh guide [] Distal medial pad  [x] Distal lateral pad [x] pelvis in neutral []accommodate pelvis [x] position upper legs [x] alignment [] accommodate ROM [] decr adduction []accommodate tone [x]removable for transfers [x]decr abduction  [x] Lateral trunk Supports [x] Lt     [x] Rt [x]decrease lateral trunk leaning [x]control tone [x]contour for increased contact [x]safety  []accommodate asymmetry [] ?????  [x] Mounting hardware  [x]lateral trunk supports  [x]back   [x]seat [x]headrest      [  x] thigh support []fixed   [x]swing away [x]attach  seat platform/cushion to w/c frame [x]attach back cushion to w/c frame [x]mount postural supports [x]mount headrest  [x]swing medial thigh support away [x]swing lateral supports away for transfers  [] ?????    Armrests  []fixed [x]adjustable height []removable   []swing away  [x]flip back   []reclining [x]full length pads []desk    []pads tubular  [x]provide support with elbow at 90   [x]provide support for w/c tray [x]change of height/angles for variable activities [x]remove for transfers [x]allow to come closer to table top [x]remove for access to tables [] ?????  Hangers/ Leg rests  []60 []70 []90 [x]elevating []heavy duty  []articulating []fixed []lift off []swing away     [x]power [x]provide LE support  []accommodate to hamstring tightness [x]elevate legs during recline   [x]provide change in position for Legs [x]Maintain placement of feet on footplate [x]durability [x]enable transfers [x]decrease edema []Accommodate lower leg length [] ?????  Foot support Footplate    []Lt  [] Rt  [x] Center mount [x]flip up     []depth/angle adjustable []Amputee adapter    [] Lt     [] Rt [x]provide foot support []accommodate to ankle ROM [x]transfers []Provide support for residual extremity [] allow foot to go under wheelchair base [x] decrease tone  [] ?????  [] Ankle strap/heel loops []support foot on foot support []decrease extraneous movement []provide input to heel  []protect foot  Tires: []pneumatic  [x]flat free inserts  []solid  [x]decrease maintenance  [x]prevent frequent flats []increase shock absorbency []decrease pain from road shock []decrease spasms from road shock [] ?????  [x] Headrest  [x]provide posterior head support []provide posterior neck support []provide lateral head support []provide anterior head support [x]support during tilt and recline [x]improve feeding   [x]improve respiration []placement of switches [x]safety  [x]accommodate  ROM  [x]accommodate tone [x]improve visual orientation  [x] Anterior chest strap [] Vest [] Shoulder retractors  [x]decrease forward movement of shoulder []accommodation of TLSO []decrease forward movement of trunk []decrease shoulder elevation []added abdominal support [x]alignment []assistance with shoulder control  [] ?????  Pelvic Positioner [x]Belt []SubASIS bar []Dual Pull []stabilize tone [x]decrease falling out of chair/ **will not Decr potential for sliding due to pelvic tilting []prevent excessive rotation []pad for protection over boney prominence []prominence comfort []special pull angle to control rotation [] ?????  Upper Extremity Support [x]L   [x] R []Arm trough    []hand support [] tray       [x]full tray []swivel mount [x]decrease edema      []decrease subluxation   [x]control tone   []placement for AAC/Computer/EADL [x]decrease gravitational pull on shoulders []provide midline positioning [x]provide support to increase UE function [x]provide hand support in natural position [x]provide work surface   POWER WHEELCHAIR CONTROLS  []Proportional  [x]Non-Proportional Type Chin control [x]Left  []Right [x]provides access for controlling wheelchair   [x]lacks motor control to operate proportional drive control []unable to understand proportional controls [x] [place] - to drive the w/c   Actuator Control Module  []Single  [x]Multiple   [x]Allow the client to operate the power seat function(s) through the joystick control   [x]Safety Reset Switches [x]Used to change modes and stop the wheelchair when driving in latch mode    [x]Upgraded Electronics   [x]programming for accurate control []progressive Disease/changing condition [x]non-proportional drive control needed [x]Needed in order to operate power seat functions through joystick control   [x]Display box []Allows user to see in which  mode and drive the wheelchair is set  [x]necessary for alternate  controls    [x]Digital interface electronics [x]Allows w/c to operate when using alternative drive controls  []ASL Head Array []Allows client to operate wheelchair  through switches placed in tri-panel headrest  []Sip and puff with tubing kit []needed to operate sip and puff drive controls  [x]Upgraded tracking electronics []increase safety when driving [x]correct tracking when on uneven surfaces  [x]Mount for switches or joystick [x]Attaches switches to w/c  [x]Swing away for access or transfers []midline for optimal placement []provides for consistent access  [x]Attendant controlled joystick plus mount [x]safety [x]long distance driving [x]operation of seat functions [x]compliance with transportation regulations [] ?????    Rear wheel placement/Axle adjustability []None []semi adjustable []fully adjustable  []improved UE access to wheels []improved stability []changing angle in space for improvement of postural stability []1-arm drive access []amputee pad placement [] ?????  Wheel rims/ hand rims  []metal  []plastic coated []oblique projections []vertical projections []Provide ability to propel manual wheelchair  [] Increase self-propulsion with hand weakness/decreased grasp  Push handles []extended  []angle adjustable  []standard []caregiver access []caregiver assist []allows "hooking" to enable increased ability to perform ADLs or maintain balance  One armed device  []Lt   []Rt []enable propulsion of manual wheelchair with one arm   [] ?????   Brake/wheel lock extension [] Lt   [] Rt []increase indep in applying wheel locks   []Side guards []prevent clothing getting caught in wheel or becoming soiled [] prevent skin tears/abrasions  Battery: Group 34 X 2 [x]to power wheelchair ?????  Other: medical bag hooks transport her portable suction pouch on left armrests for personal items  The above equipment has a life- long use expectancy. Growth and changes in medical and/or functional  conditions would be the exceptions. This is to certify that the therapist has no financial relationship with durable medical provider or manufacturer. The therapist will not receive remuneration of any kind for the equipment recommended in this evaluation.   Patient has mobility limitation that significantly impairs safe, timely participation in one or more mobility related ADL's.  (bathing, toileting, feeding, dressing, grooming, moving from room to room)                                                             [x] Yes [] No Will mobility device sufficiently improve ability to participate and/or be aided in participation of MRADL's?         [x] Yes [] No Can limitation be compensated for with use of a cane or walker?                                                                                [] Yes [x] No Does patient or caregiver demonstrate ability/potential ability & willingness to safely use the mobility device?   [x] Yes [] No Does patient's home environment support use of recommended mobility device?                                                    [  x] Yes [] No Does patient have sufficient upper extremity function necessary to functionally propel a manual wheelchair?    [] Yes [x] No Does patient have sufficient strength and trunk stability to safely operate a POV (scooter)?                                  [] Yes [x] No Does patient need additional features/benefits provided by a power wheelchair for MRADL's in the home?       [x] Yes [] No Does the patient demonstrate the ability to safely use a power wheelchair?                                                              [x] Yes [] No  Therapist Name Printed: Jamey Reas, PT, DPT Date: 02/10/2016  Therapist's Signature:   Date:   Supplier's Name Printed: [employer] Date: 02/10/2016  Supplier's Signature:   Date:  Patient/Caregiver Signature:   Date:     This is to certify that I have read this evaluation and do agree  with the content within:    Physician's Name Printed: Alger Simons, MD  41 Signature:  Date:     This is to certify that I, the above signed therapist have the following affiliations: [] This DME provider [] Manufacturer of recommended equipment [] Patient's lo tm reacit[x] nef t ave                         Plan - 02/10/16 1615    Clinical Impression Statement Patient would benefit from power w/c. see note   Rehab Potential Good   PT Frequency One time visit   Consulted and Agree with Plan of Care Patient;Family member/caregiver   Family Member Consulted boyfriend               P Patient Active Problem List   Diagnosis Date Noted  . Chronic respiratory failure ([place]) 01/17/2016  . Spastic tetraplegia ([place]) 09/29/2015  . Chronic daily headache 09/29/2015  . Brain stem infarction ([place]) 07/30/2015  . Anger reaction 07/30/2015  . Generalized anxiety disorder 07/30/2015  . Venous (peripheral) insufficiency 12/24/2014  . Quadriplegia ([place])   . CVA (cerebral infarction)   nfarcWALDRON,ROBINDRON,ROBIN3/24/20173/25:08 PM5:13 PM  [address],[id number] [phone]   [phone]  AARNA MIHALKO [id number]:[id number] Date of Birth: Aug 11, 1986

## 2016-02-15 ENCOUNTER — Encounter: Payer: Medicaid Other | Attending: Physical Medicine & Rehabilitation | Admitting: Physical Medicine & Rehabilitation

## 2016-02-15 ENCOUNTER — Encounter: Payer: Self-pay | Admitting: Physical Medicine & Rehabilitation

## 2016-02-15 VITALS — BP 115/63 | HR 90

## 2016-02-15 DIAGNOSIS — G8250 Quadriplegia, unspecified: Secondary | ICD-10-CM

## 2016-02-15 DIAGNOSIS — F411 Generalized anxiety disorder: Secondary | ICD-10-CM | POA: Diagnosis not present

## 2016-02-15 DIAGNOSIS — F911 Conduct disorder, childhood-onset type: Secondary | ICD-10-CM | POA: Diagnosis not present

## 2016-02-15 DIAGNOSIS — I638 Other cerebral infarction: Secondary | ICD-10-CM | POA: Insufficient documentation

## 2016-02-15 NOTE — Progress Notes (Signed)
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 This includes bleeding, bruising, infection, excessive weakness, or medication side effects. A REMS form is on file and signed.  Needle: 50mm injectable monopolar needle electrode  Number of units per muscle Hamstrings bilateral---400 units divided between both legs with 4 access points per leg Gastroc/soleus 0 units Quads 0 units Tibialis Posterior 0 units EHL 0 units All injections were done after obtaining appropriate EMG activity and after negative drawback for blood. The patient tolerated the procedure well. Post procedure instructions were given. A followup appointment was made.

## 2016-02-15 NOTE — Patient Instructions (Signed)
  PLEASE CALL ME WITH ANY PROBLEMS OR QUESTIONS (#[phone]).      

## 2016-02-20 ENCOUNTER — Other Ambulatory Visit: Payer: Self-pay | Admitting: Pediatrics

## 2016-02-21 ENCOUNTER — Other Ambulatory Visit: Payer: Self-pay

## 2016-02-21 NOTE — Telephone Encounter (Signed)
NEEDS OV FOR FURTHER REFILLS  

## 2016-02-22 ENCOUNTER — Encounter: Payer: Self-pay | Admitting: Psychology

## 2016-02-22 ENCOUNTER — Other Ambulatory Visit: Payer: Self-pay | Admitting: Physical Medicine & Rehabilitation

## 2016-02-22 ENCOUNTER — Ambulatory Visit: Payer: Medicaid Other | Attending: Psychology | Admitting: Psychology

## 2016-02-22 DIAGNOSIS — F419 Anxiety disorder, unspecified: Secondary | ICD-10-CM | POA: Diagnosis not present

## 2016-02-22 NOTE — Progress Notes (Signed)
[facility]  [address]   Telephone [phone] [address] Fax [phone] [place], [place] [address]   Psychology Progress Note   Name:  Jeronimo Normananda R Violette Date of Birth:  May 27, 1986 MRN:  [id number]  Date:02/22/2016 (1216m) psychotherapy She reports that she is doing well physically and emotionally. Some worry about her mother's emotional stability (especially after death of daughter) and distress over her own recent break-up with a romantic partner. Continues to report having a large circle of supportive friends. She expressed many ideas and goals regarding promoting disability awareness and disability housing options to the community. Relishes acting in a non-conventional manner.  Diagnostic Impression Anxiety [F41.9]  Return in one month.  Gladstone PihMichael F. Tolbert Matheson, Ph.D Licensed Psychologist

## 2016-03-03 ENCOUNTER — Telehealth: Payer: Self-pay | Admitting: Physical Medicine & Rehabilitation

## 2016-03-03 DIAGNOSIS — R519 Headache, unspecified: Secondary | ICD-10-CM

## 2016-03-03 DIAGNOSIS — R51 Headache: Principal | ICD-10-CM

## 2016-03-03 MED ORDER — IMIPRAMINE HCL 50 MG PO TABS: 100.0000 mg | ORAL_TABLET | Freq: Every day | ORAL | Status: DC

## 2016-03-03 NOTE — Telephone Encounter (Signed)
Sent in corrected rx for tofranil

## 2016-03-07 ENCOUNTER — Encounter: Payer: Medicaid Other | Admitting: Physical Medicine & Rehabilitation

## 2016-03-08 ENCOUNTER — Ambulatory Visit: Payer: Medicaid Other | Admitting: Physical Medicine & Rehabilitation

## 2016-03-14 ENCOUNTER — Other Ambulatory Visit: Payer: Self-pay | Admitting: Allergy

## 2016-03-14 MED ORDER — FLUTICASONE PROPIONATE 50 MCG/ACT NA SUSP: NASAL | Status: DC

## 2016-03-16 ENCOUNTER — Other Ambulatory Visit: Payer: Self-pay | Admitting: Pediatrics

## 2016-03-21 ENCOUNTER — Encounter: Payer: Self-pay | Admitting: Psychology

## 2016-03-21 ENCOUNTER — Ambulatory Visit: Payer: Medicaid Other | Attending: Psychology | Admitting: Psychology

## 2016-03-21 DIAGNOSIS — F419 Anxiety disorder, unspecified: Secondary | ICD-10-CM | POA: Insufficient documentation

## 2016-03-21 NOTE — Progress Notes (Signed)
[facility]  [address]   Telephone [phone] [address] Fax [phone] [place], [place] [address]   Psychology Progress Note   Name:  Veronica Huff Date of Birth:  August 09, 1986 MRN:  [id number]  Date:03/21/2016 (324m) psychotherapy Doing well. Anxiety level is lower of late as mother and other family members seem to be maintaining sobriety. Relationship with boyfriend consistent, without drama. Health status is stable. No new stressors.  Diagnostic Impression Anxiety [F41.9]  Return in 5 weeks.  Gladstone PihMichael F. Alene Bergerson, Ph.D Licensed Psychologist

## 2016-03-22 ENCOUNTER — Encounter: Payer: Medicaid Other | Attending: Physical Medicine & Rehabilitation | Admitting: Physical Medicine & Rehabilitation

## 2016-03-22 ENCOUNTER — Encounter: Payer: Self-pay | Admitting: Physical Medicine & Rehabilitation

## 2016-03-22 ENCOUNTER — Other Ambulatory Visit (HOSPITAL_COMMUNITY): Payer: Self-pay | Admitting: Internal Medicine

## 2016-03-22 VITALS — BP 121/72 | HR 84 | Resp 14

## 2016-03-22 DIAGNOSIS — G8250 Quadriplegia, unspecified: Secondary | ICD-10-CM | POA: Insufficient documentation

## 2016-03-22 DIAGNOSIS — F911 Conduct disorder, childhood-onset type: Secondary | ICD-10-CM | POA: Diagnosis not present

## 2016-03-22 DIAGNOSIS — F411 Generalized anxiety disorder: Secondary | ICD-10-CM | POA: Diagnosis not present

## 2016-03-22 DIAGNOSIS — R1310 Dysphagia, unspecified: Secondary | ICD-10-CM

## 2016-03-22 DIAGNOSIS — I638 Other cerebral infarction: Secondary | ICD-10-CM | POA: Insufficient documentation

## 2016-03-22 DIAGNOSIS — I639 Cerebral infarction, unspecified: Secondary | ICD-10-CM

## 2016-03-22 MED ORDER — DANTROLENE SODIUM 100 MG PO CAPS: ORAL_CAPSULE | ORAL | Status: DC

## 2016-03-22 NOTE — Patient Instructions (Signed)
  PLEASE CALL ME WITH ANY PROBLEMS OR QUESTIONS (#[phone]).      

## 2016-03-22 NOTE — Progress Notes (Signed)
Subjective:    Patient ID: Veronica Huff, female    DOB: [birth year], 30 y.o.   MRN: [id number]  HPI   [name] is here primarily for a wheelchair evaluation today. I botoxed her bilateral hamstrings in late March which helped her spasms/positioning in bed/transfers. She remains dependent upon her powered wheelchair for mobility at home and in the community.   Pain Inventory Average Pain 0 Pain Right Now 0 My pain is NA  In the last 24 hours, has pain interfered with the following? General activity NA Relation with others NA Enjoyment of life NA What TIME of day is your pain at its worst? NA Sleep (in general) NA  Pain is worse with: NA Pain improves with: NA Relief from Meds: NA  Mobility use a wheelchair Do you have any goals in this area?  no  Function Do you have any goals in this area?  no  Neuro/Psych No problems in this area  Prior Studies Any changes since last visit?  no  Physicians involved in your care Any changes since last visit?  no   Family History  Problem Relation Age of Onset  . Diabetes Mother   . Hypertension Mother   . Varicose Veins Mother   . Cancer Maternal Aunt   . Diabetes Maternal Grandfather   . Heart disease Maternal Grandfather   . Heart disease Paternal Grandfather    Social History   Social History  . Marital Status: Single    Spouse Name: N/A  . Number of Children: N/A  . Years of Education: college    Occupational History  . disabled    Social History Main Topics  . Smoking status: Never Smoker   . Smokeless tobacco: None  . Alcohol Use: No  . Drug Use: No  . Sexual Activity: Yes    Birth Control/ Protection: None   Other Topics Concern  . None   Social History Narrative   Lives at home with boyfriend   Has a RN [name] that helps to care for her    Drinks no caffeine    Past Surgical History  Procedure Laterality Date  . Ileostomy  2006  . Esophagogastroduodenoscopy    . Spine surgery  2004,2011  .  Tracheostomy  2000  . Gastrostomy tube placement  2000  . Bronchoscopy  2006  . Throat surgery  2004  . Wisdom tooth extraction  2008  . Bladder surgery  2009,2012  . Tendon repair  2012    Left wrist  . Removal of gastrostomy tube    . Cholecystectomy  11/18/2012    Procedure: LAPAROSCOPIC CHOLECYSTECTOMY;  Surgeon: Axel Filler, MD;  Location: [facility];  Service: General;  Laterality: N/A;   Past Medical History  Diagnosis Date  . CVA (cerebral infarction)     Ischemic brainstem stroke in 2011 at age 62 due to unknown reasons--quadriplegic after stroke.  . Quadriplegia (HCC)     Secondary to stroke at age 28-has trach-lives with boyfriend-motorized w/c-joystick to chin to drive w/c--I & O cath thru suprapubic stoma - has nursing care 16 hrs daily and boyfriend able to help pt the other hours of day.  . Recurrent UTI (urinary tract infection)     Due to suprapubic stoma, repetitive catheterizations  . Sleep apnea     On ventilator at night -has oxygen but rarely needs unless she has a cold and sats drop  . Depression     Zoloft has helped-not having any current depression  .  Edema extremities     Feet and legs-mostly in feet  . Headache(784.0)     Prior to imipramine-none currently   BP 121/72 mmHg  Pulse 84  Resp 14  LMP 03/22/2016  Opioid Risk Score:   Fall Risk Score:  `1  Depression screen PHQ 2/9  Depression screen [facility] 02/15/2016 07/30/2015  Decreased Interest 0 0  Down, Depressed, Hopeless 1 0  PHQ - 2 Score 1 0  Altered sleeping 1 0  Tired, decreased energy 1 0  Change in appetite 0 0  Feeling bad or failure about yourself  1 0  Trouble concentrating 1 0  Moving slowly or fidgety/restless 0 0  Suicidal thoughts 0 0  PHQ-9 Score 5 0     Review of Systems  All other systems reviewed and are negative.      Objective:   Physical Exam  General: Alert and oriented x 3, No apparent distress in her powered w/c  HEENT: Head is normocephalic,  atraumatic, PERRLA, EOMI, sclera anicteric, oral mucosa pink and moist, dentition intact, ext ear canals clear,  Neck: Supple without JVD or lymphadenopathy, trach with speaking valve.  Heart: Reg rate and rhythm. No murmurs rubs or gallops  Chest: CTA bilaterally without wheezes, rales, or rhonchi; no distress  Abdomen: Soft, non-tender, slightly-distended, bowel sounds positive. Has binder  Extremities: No clubbing, cyanosis, or edema. Pulses are 2+  Skin: Clean and intact without signs of breakdown  Neuro: Pt is cognitively appropriate with normal insight, memory, and awareness. Intact pain and general touch below the neck. No voluntary muscle function in any of her four limbs. DTR's 3+, she has sustained clonus in both lower ext. MAS: 1-2/4 biceps bilat with contracture at end rom, right arm remains supinated with wrist extended. tone--2/4. LUE pronated at forearm/wrist with 3/4 tone.1/4 quads, gastrocs. Hamstrings also 1. Clonus sustained in each lower extremity with ROM. pecs bilaterally are 1/4---mild capsulitis. Minimal tone in hip adductors.  Musculoskeletal: Full ROM, No pain with AROM or PROM in the neck, trunk, or extremities. Posture appropriate  Psych: Pts affect appropriate. Pt is cooperative      Assessment & Plan:  ASSESSMENT:  1. 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  2. 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  3. Urinary retention and incontinence.  4. Chronic nausea  5. 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 Chronic headaches. Has a family hx of aneurysms per pt.   PLAN:   1. Mrs. Veronica Huff is present for a wheelchair evaluation today. She has spastic tetraplegia due to a medullary infarct. She is unable to utilize a cane, walker, manual wheelchair, or scooter due to her profound neurological deficits. I have reviewed the PT wheelchair assessment and recommendations and agree with all findings.  Cherish is motivated to use a powered chair which will allow her increased independence at home and in the community with mobility and basic self-care. She is competent to safely control the recommended chair as well.    2. I will consider in the future her request for an MRI in the context of her prior infarct and family hx of aneurysm.  3. Will set up for botox 400 units to bilateral hamstrings again in 3 months   4. Topamax effective---continue at 25mg  qhs dose.  5. Continue dantrium to 100mg  TID over one week.   6. 15 minutes of face to face patient care time were spent during this visit. All questions were encouraged and  answered. Will see the patient back in approximately 3 months' time for botox.

## 2016-04-06 ENCOUNTER — Ambulatory Visit (HOSPITAL_COMMUNITY)
Admission: RE | Admit: 2016-04-06 | Discharge: 2016-04-06 | Disposition: A | Discharge status: Home / Self Care | Payer: Medicaid Other | Source: Ambulatory Visit | Attending: Internal Medicine | Admitting: Internal Medicine

## 2016-04-06 DIAGNOSIS — R1310 Dysphagia, unspecified: Secondary | ICD-10-CM | POA: Insufficient documentation

## 2016-04-06 DIAGNOSIS — Z930 Tracheostomy status: Secondary | ICD-10-CM | POA: Insufficient documentation

## 2016-04-11 ENCOUNTER — Ambulatory Visit: Payer: Self-pay | Admitting: Physical Medicine & Rehabilitation

## 2016-04-13 ENCOUNTER — Other Ambulatory Visit: Payer: Self-pay | Admitting: Pediatrics

## 2016-04-18 ENCOUNTER — Other Ambulatory Visit: Payer: Self-pay | Admitting: Physical Medicine & Rehabilitation

## 2016-04-26 ENCOUNTER — Ambulatory Visit: Payer: Medicaid Other | Admitting: Psychology

## 2016-05-03 ENCOUNTER — Ambulatory Visit: Payer: Medicaid Other | Admitting: Pulmonary Disease

## 2016-05-19 ENCOUNTER — Other Ambulatory Visit: Payer: Self-pay | Admitting: Physical Medicine & Rehabilitation

## 2016-06-01 ENCOUNTER — Other Ambulatory Visit: Payer: Self-pay | Admitting: Allergy

## 2016-06-01 MED ORDER — LEVOCETIRIZINE DIHYDROCHLORIDE 5 MG PO TABS: 5.0000 mg | ORAL_TABLET | Freq: Every day | ORAL | Status: DC | PRN

## 2016-06-06 ENCOUNTER — Encounter: Payer: Self-pay | Admitting: Psychology

## 2016-06-06 ENCOUNTER — Ambulatory Visit: Payer: Medicaid Other | Attending: Psychology | Admitting: Psychology

## 2016-06-06 DIAGNOSIS — F419 Anxiety disorder, unspecified: Secondary | ICD-10-CM
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
## 2016-06-08 ENCOUNTER — Ambulatory Visit: Payer: Medicaid Other | Admitting: Pulmonary Disease

## 2016-06-17 ENCOUNTER — Other Ambulatory Visit: Payer: Self-pay | Admitting: Physical Medicine & Rehabilitation

## 2016-06-17 DIAGNOSIS — G8250 Quadriplegia, unspecified: Secondary | ICD-10-CM

## 2016-06-17 DIAGNOSIS — I639 Cerebral infarction, unspecified: Secondary | ICD-10-CM

## 2016-06-20 ENCOUNTER — Encounter: Payer: Medicaid Other | Admitting: Physical Medicine & Rehabilitation

## 2016-06-21 ENCOUNTER — Encounter: Payer: Self-pay | Admitting: Psychology

## 2016-06-21 ENCOUNTER — Ambulatory Visit: Payer: Medicaid Other | Attending: Psychology | Admitting: Psychology

## 2016-06-21 DIAGNOSIS — F419 Anxiety disorder, unspecified: Secondary | ICD-10-CM | POA: Diagnosis not present
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
## 2016-07-05 ENCOUNTER — Ambulatory Visit: Payer: Medicaid Other | Admitting: Psychology

## 2016-07-18 ENCOUNTER — Other Ambulatory Visit: Payer: Self-pay | Admitting: Physical Medicine & Rehabilitation

## 2016-07-18 DIAGNOSIS — G8250 Quadriplegia, unspecified: Secondary | ICD-10-CM

## 2016-07-18 DIAGNOSIS — R519 Headache, unspecified: Secondary | ICD-10-CM

## 2016-07-18 DIAGNOSIS — R51 Headache: Principal | ICD-10-CM

## 2016-07-18 DIAGNOSIS — I639 Cerebral infarction, unspecified: Secondary | ICD-10-CM

## 2016-07-19 ENCOUNTER — Other Ambulatory Visit: Payer: Self-pay | Admitting: Allergy

## 2016-07-19 MED ORDER — LEVOCETIRIZINE DIHYDROCHLORIDE 5 MG PO TABS: 5.0000 mg | ORAL_TABLET | Freq: Every day | ORAL | 0 refills | Status: DC

## 2016-07-25 ENCOUNTER — Other Ambulatory Visit: Payer: Self-pay | Admitting: Pediatrics

## 2016-07-26 ENCOUNTER — Encounter: Payer: Medicaid Other | Attending: Physical Medicine & Rehabilitation | Admitting: Physical Medicine & Rehabilitation

## 2016-07-26 ENCOUNTER — Encounter: Payer: Self-pay | Admitting: Physical Medicine & Rehabilitation

## 2016-07-26 VITALS — BP 113/75 | HR 100

## 2016-07-26 DIAGNOSIS — G8250 Quadriplegia, unspecified: Secondary | ICD-10-CM | POA: Diagnosis not present

## 2016-07-26 MED ORDER — DANTROLENE SODIUM 100 MG PO CAPS: ORAL_CAPSULE | ORAL | 5 refills | Status: DC

## 2016-07-26 NOTE — Patient Instructions (Signed)
BACLOFEN  WEEK ONE---40-30-40-40 WEEK TWO---40-30-30-40 WEEK THREE--30-30-30-40 WEEK FOUR + ---30-30-30-30

## 2016-07-26 NOTE — Progress Notes (Signed)
Botox Injection for spasticity using needle EMG guidance Indication: Spastic tetraplegia (HCC)   Dilution: 100 Units/ml        Total Units Injected: 400 Indication: Severe spasticity which interferes with ADL,mobility and/or  hygiene and is unresponsive to medication management and other conservative care Informed consent was obtained after describing risks and benefits of the procedure with the patient. This includes bleeding, bruising, infection, excessive weakness, or medication side effects. A REMS form is on file and signed.  Needle: 68mmBlimaMerlindMonroe Regional Hosp718 ValCentral Illinois Endoscopy John Muir Behavioral Health CentBeverelyKorea PacVa Boston 93meaBlimaMerlindEye Surgery Center At The Bilt89 NortCommunity Hospitals And Wellness Centers Cen62merBlimaMerlindStanislaus Surgical HospSt Simons By-The-SeJohn J. Pershing Va Medical Ce49mtBBlimaMerlindLewisburg Plastic Surgery And Laser CeSystem First Hill Surgery Cent74mr BlimaMerlindHiLLCrest HospitaTotally Kids RehabilitatUrology Surgical Partners LBeverelyKorea PacPhysicia81ms BlimaMerlindRockefeller University Hosp2University Hospital Stoney Brook SouthamptoBoise Va Medical CentBeverelyKorea PacGeorgia Sp49mneBlimaMerlindGrandview Hospital & MedicaPawnee County MemoriaSt. Helena Parish HospitBeverelyKorea PacLong Isl78mndBlimaMerlindOconee Surgery Ce774Sierra VistMarshall Medical Center SouBeverelyKorea PacB58mooBlimaMerlindBakersfield Heart HospWesterville Endoscopy Uc Medical Center PsychiatrBeverelyKore25m PBlimaMerlindUpmHampton Behavioral HeaRsc Illinois LLC Dba Regional SurgicentBeverelyKorea PacHanover S70mrgBlimaMerlindChoctaw Nation Indian Hospital (TaEndoscopy Center Of TSan Luis Valley Health Conejos County HospitBeverelyKorea PacKing'S Daughters23m HBlimaMerlindCommunity Surgery Center Ho68Virginia Mason 68medBlimaMerlindValley Outpatient Surgical CenPacific Orange HosMercer County Joint Township Community HospitBeverelyKorea28mPaBlimaMerlindO'Connor HospKalispell Regional MediMontevista107mHoBlimaMerlindWaverley Surgery CentJohn Muir Medical Center-ConcYork Endoscopy Center LLC Dba Upmc Spec81malBlimaMerlindAslaska Surgery Ce9990 WeThe EverMea56mowBlimaMerlindParker Adventist HospUnion HoEastside Psychiatric HospitBeve1melBlimaMerlindEllsworth Municipal HospIreland Army CommunitTexas Health Seay B57mhaBlimaMerlindTahoe Pacific Hospitals - Mea8334Dmc SurgerSurgical Associates Endoscopy Clinic LBeverelyKorea PacOpticare1mEyBlimaMerlindBaylor Scott & White Medical Center - Pflugerv95Aberdeen Surgery Specialty Surgery Cen22merBlimaMerlindCoffey County Hospital 28Osborne County MemoriaSummit Healthcare AssocTresa GarterTulsa Er & HoDos PaloParkview Whitley Hospital3Paris Regional MShanda oleus 0 units Hamstrings 200 units into each leg, 4 separate access points----400 units total between both legs Tibialis Posterior 0 units EHL 0 units All injections were done after obtaining appropriate EMG activity and after negative drawback for blood. The patient tolerated the procedure well. Post procedure instructions were given. Return in about 3 months (around 10/25/2016).  Will try to decrease baclofen as she feels she's having more difficulties with swallowing and aspiration. A slow taper was initiated decreasing to 30mg  QID over a month's time.  Refilled dantrium. Needs prior-auth

## 2016-07-28 NOTE — Telephone Encounter (Signed)
Return Ms. Gelardi call regarding her Dantrolene. [place] Tracks paperwork was performed on 07/26/2016. Staff spoke to her nurse she is aware. [facility] to [facility] the medication has been approved. Ms. Willeen CassBennett states when she called [facility]  She was instructed medication is not covered.Placed a call to  [facility]  and medication has been approved. Ms. Willeen CassBennett  Is aware  And verbalizes understanding. Approved until 07/22/2017.

## 2016-08-02 ENCOUNTER — Ambulatory Visit: Payer: Medicaid Other | Attending: Psychology | Admitting: Psychology

## 2016-08-02 ENCOUNTER — Encounter: Payer: Self-pay | Admitting: Psychology

## 2016-08-02 DIAGNOSIS — F419 Anxiety disorder, unspecified: Secondary | ICD-10-CM | POA: Diagnosis not present

## 2016-08-02 NOTE — Progress Notes (Signed)
[facility]  [address]   Telephone [phone] [address] Fax [phone] [place], [place] [address]   Psychology Progress Note   Name:  Veronica Huff Date of Birth:  04/30/1986 MRN:  [id number]  Date:08/02/2016 (241m) psychotherapy She reported feeling stressed by the continued lack of nighttime nursing coverage, now going on 38 nights. She has been in touch with nursing companies but to no avail. Otherwise no new problems or changes reported.  Diagnostic Impression Anxiety [F41.9]  This was our final session due to my job change. She is not at risk to harm self or others. I wished her well. She has a list of other psychotherapists to contact if she wishes to continue counseling.Gladstone Pih.  Yessica Putnam F. Joani Cosma, Ph.D Licensed Psychologist

## 2016-08-16 ENCOUNTER — Other Ambulatory Visit: Payer: Self-pay

## 2016-08-16 ENCOUNTER — Encounter: Payer: Self-pay | Admitting: Pulmonary Disease

## 2016-08-16 ENCOUNTER — Ambulatory Visit (INDEPENDENT_AMBULATORY_CARE_PROVIDER_SITE_OTHER): Payer: Medicaid Other | Admitting: Pulmonary Disease

## 2016-08-16 ENCOUNTER — Other Ambulatory Visit: Payer: Self-pay | Admitting: Physical Medicine & Rehabilitation

## 2016-08-16 VITALS — BP 104/68 | HR 104

## 2016-08-16 DIAGNOSIS — Z930 Tracheostomy status: Secondary | ICD-10-CM

## 2016-08-16 DIAGNOSIS — G8250 Quadriplegia, unspecified: Secondary | ICD-10-CM

## 2016-08-16 DIAGNOSIS — J9611 Chronic respiratory failure with hypoxia: Secondary | ICD-10-CM | POA: Diagnosis not present

## 2016-08-16 DIAGNOSIS — Z23 Encounter for immunization: Secondary | ICD-10-CM

## 2016-08-16 DIAGNOSIS — R519 Headache, unspecified: Secondary | ICD-10-CM

## 2016-08-16 DIAGNOSIS — R51 Headache: Principal | ICD-10-CM

## 2016-08-16 DIAGNOSIS — G4733 Obstructive sleep apnea (adult) (pediatric): Secondary | ICD-10-CM

## 2016-08-16 NOTE — Progress Notes (Signed)
Current Outpatient Prescriptions on File Prior to Visit  Medication Sig  . acetaminophen (TYLENOL) 500 MG tablet Take 500 mg by mouth every 6 (six) hours as needed (pain).  . baclofen (LIORESAL) 20 MG tablet Take 40 mg by mouth 4 (four) times daily.  . bisacodyl (DULCOLAX) 10 MG suppository Place 10 mg rectally every other day as needed. constipation  . bismuth subsalicylate (PEPTO BISMOL) 262 MG/15ML suspension Take 30 mLs by mouth every 6 (six) hours as needed.  . dantrolene (DANTRIUM) 100 MG capsule TAKE 1 CAPSULE(100 MG) BY MOUTH THREE TIMES DAILY  . Dextromethorphan-Guaifenesin 5-100 MG/5ML LIQD Take 5 mLs by mouth 2 (two) times daily as needed.  . diphenhydrAMINE (SOMINEX) 25 MG tablet Take 25 mg by mouth every 6 (six) hours as needed for sleep.  Marland Kitchen. EPIPEN 2-PAK 0.3 MG/0.3ML SOAJ injection USE UTD FOR ALLERGIC REACTION  . fluticasone (FLONASE) 50 MCG/ACT nasal spray SHAKE LIQUID WELL AND USE 2 SPRAYS IN EACH NOSTRIL DAILY FOR STUFFY NOSE OR DRAINAGE  . guaiFENesin (MUCINEX) 600 MG 12 hr tablet Take 600 mg by mouth 2 (two) times daily as needed (1-2 tab BID PRN).  . hydrocortisone cream 0.5 % Apply 1 application topically 2 (two) times daily. To hairline and brows  . ibuprofen (ADVIL,MOTRIN) 200 MG tablet Take 400 mg by mouth every 6 (six) hours as needed.  Marland Kitchen. imipramine (TOFRANIL) 50 MG tablet TAKE 2 TABLETS(100 MG) BY MOUTH AT BEDTIME  . ketoconazole (NIZORAL) 2 % shampoo Apply 1 application topically 2 (two) times a week.  . levocetirizine (XYZAL) 5 MG tablet Take 1 tablet (5 mg total) by mouth daily as needed.  . loperamide (IMODIUM A-D) 2 MG tablet Take 4-8 mg by mouth daily as needed for diarrhea or loose stools (not to exceed 4 tablets in 24 hours).  . medroxyPROGESTERone (DEPO-PROVERA) 150 MG/ML injection INJ 1 ML IM Q 13 WEEKS  . Multiple Vitamin (MULTIVITAMIN) tablet Take 1 tablet by mouth daily.  . mupirocin ointment (BACTROBAN) 2 % 1 application by Tracheal Tube route daily.  Applies to trach-stoma site  . neomycin-polymyxin B (NEOSPORIN GU IRRIGANT) SOLN 30-40 mLs by GU irrigant route at bedtime. Instilled thru I&O catheter at bedtime-to prevent uti  . nitrofurantoin (MACRODANTIN) 50 MG capsule Take 50 mg by mouth at bedtime.  Marland Kitchen. nystatin (MYCOSTATIN) powder Apply topically 2 (two) times daily as needed. Small amount  . PAZEO 0.7 % SOLN INSTILL 1 DROP IN BOTH EYES TWICE DAILY  . polyethylene glycol (MIRALAX / GLYCOLAX) packet Take 17 g by mouth daily.  . promethazine (PHENERGAN) 25 MG suppository Place 25 mg rectally every 6 (six) hours as needed for nausea or vomiting.  . sertraline (ZOLOFT) 100 MG tablet Take 100 mg by mouth at bedtime.  . solifenacin (VESICARE) 10 MG tablet Take 10 mg by mouth daily. Every am  . sucralfate (CARAFATE) 1 GM/10ML suspension Take 1 g by mouth 3 (three) times daily.   Marland Kitchen. topiramate (TOPAMAX) 25 MG tablet Take 1-2 tablets (25-50 mg total) by mouth at bedtime.  Marland Kitchen. PAZEO 0.7 % SOLN INSTILL 1 GTT IN OU D FOR ITCHY EYES  . Pseudoeph-Hydrocodone (PSEUDOEPHEDRINE-HYDROCODONE PO) Take 1 tablet by mouth daily as needed.   No current facility-administered medications on file prior to visit.     Chief Complaint  Patient presents with  . Follow-up    Pt. has no problems with her Vent.    Tests:  Past medical history CVA with brain stem involvement, Quadriplegia, Depression, Recurrent UTI  Past surgical history, Family history, Social history, Allergies reviewed  Vital signs BP 104/68 (BP Location: Right Arm, Patient Position: Sitting, Cuff Size: Normal)   Pulse (!) 104   SpO2 95%   History of Present Illness Veronica Huff is a 31 y.o. female with chronic respiratory failure s/p tracheostomy with nocturnal ventilatory support.  She reports having ONO last February >> never received results.  No issues with vent or trach.  She has appt with Dr. Jenne Pane later this month.  Denies cough, wheeze, sputum.    She has trouble  swallowing two of her pills.  Physical Exam:  General - pleasant, in wheelchair ENT - No sinus tenderness, no oral exudate, tracheostomy stoma in place (#5 pediatric trach), normal phonation Cardiac - s1s2 regular, no murmur, pulses symmetric Chest - No wheeze/rales/dullness, good air entry, normal respiratory excursion Back - No focal tenderness Abd - Soft, non-tender Ext - No edema Neuro - alert, follows commands, able to shrug shoulders but otherwise no movement Skin - No rashes Psych - Normal mood, and behavior   Discussion: 30 yo female with chronic respiratory failure and sleep disordered breathing 2nd to brain stem infarct at age 78 with resulting quadriplegia.  Assessment/plan:  Sleep disordered breathing with hx of obstructive sleep apnea. Chronic hypoxic respiratory failure. - continue nocturnal ventilator at night  - will arrange for overnight oximetry with tracheostomy and nocturnal ventilator - depending on above results will determine if she needs repeat sleep study at this time >> given her requirements for assistance with ADLs, she might need to have referral to tertiary center to have sleep study if needed - she will need prevnar after February 2018  Tracheostomy status. - she is followed by Dr. Jenne Pane with ENT   Patient Instructions  Flu shot today  Will arrange for overnight oxygen test with you using ventilator at night  Follow up in 1 year    Coralyn Helling, M.D. Pager [phone] 08/16/2016, 11:20 AM

## 2016-08-16 NOTE — Patient Instructions (Signed)
Flu shot today  Will arrange for overnight oxygen test with you using ventilator at night  Follow up in 1 year

## 2016-08-16 NOTE — Addendum Note (Signed)
Addended by: Garfield CorneaMABRY, Mayerli Kirst L on: 08/16/2016 11:27 AM   Modules accepted: Orders

## 2016-08-21 ENCOUNTER — Other Ambulatory Visit: Payer: Self-pay | Admitting: Allergy

## 2016-08-31 ENCOUNTER — Other Ambulatory Visit: Payer: Self-pay | Admitting: Otolaryngology

## 2016-08-31 ENCOUNTER — Telehealth: Payer: Self-pay | Admitting: Physical Medicine & Rehabilitation

## 2016-08-31 NOTE — Telephone Encounter (Signed)
Meredith RN with Home Health went out for a visit with patient and patient is requesting to be put back on 40mg  4 times a day of Baclofen.  If this is okay, please call Sharyl NimrodMeredith at [phone].

## 2016-09-05 NOTE — Telephone Encounter (Signed)
HHRN has called back a second time about the request for Baclofen.  Please advise.

## 2016-09-06 MED ORDER — BACLOFEN 20 MG PO TABS
40.0000 mg | ORAL_TABLET | Freq: Four times a day (QID) | ORAL | 4 refills | Status: DC
Start: 2016-09-06 — End: 2017-02-13

## 2016-09-06 NOTE — Telephone Encounter (Signed)
rx sent [facility]

## 2016-09-08 ENCOUNTER — Telehealth: Payer: Self-pay | Admitting: *Deleted

## 2016-09-08 NOTE — Telephone Encounter (Signed)
HHRN calling about going back up to 40 mg q 6 hr of baclofen for Veronica Huff.  She is asking if they can go back up now or does it have to be tapered up.  Per Dr Wynn BankerKirsteins they can start taking the 40 mg from the 30 mg q 6 hr now.

## 2016-09-12 ENCOUNTER — Encounter: Payer: Self-pay | Admitting: Allergy and Immunology

## 2016-09-12 ENCOUNTER — Ambulatory Visit (INDEPENDENT_AMBULATORY_CARE_PROVIDER_SITE_OTHER): Payer: Medicaid Other | Admitting: Allergy and Immunology

## 2016-09-12 VITALS — BP 132/82 | HR 97 | Temp 98.2°F | Resp 18

## 2016-09-12 DIAGNOSIS — H1013 Acute atopic conjunctivitis, bilateral: Secondary | ICD-10-CM | POA: Diagnosis not present

## 2016-09-12 DIAGNOSIS — H1010 Acute atopic conjunctivitis, unspecified eye: Secondary | ICD-10-CM | POA: Insufficient documentation

## 2016-09-12 DIAGNOSIS — J3089 Other allergic rhinitis: Secondary | ICD-10-CM

## 2016-09-12 MED ORDER — FLUTICASONE PROPIONATE 50 MCG/ACT NA SUSP: NASAL | 5 refills | Status: DC

## 2016-09-12 MED ORDER — OLOPATADINE HCL 0.7 % OP SOLN: 1.0000 [drp] | OPHTHALMIC | 5 refills | Status: DC

## 2016-09-12 MED ORDER — LEVOCETIRIZINE DIHYDROCHLORIDE 5 MG PO TABS: ORAL_TABLET | ORAL | 5 refills | Status: DC

## 2016-09-12 NOTE — Assessment & Plan Note (Signed)
   Treatment plan as outlined above for allergic rhinitis.  A refill prescription has been provided for Pazeo, one drop per eye daily as needed.  I have also recommended eye lubricant drops (i.e., Natural Tears) as needed. 

## 2016-09-12 NOTE — Patient Instructions (Signed)
Allergic rhinitis Well-controlled on current regimen.  Continue appropriate allergen avoidance measures, levocetirizine 5 mg daily as needed, and fluticasone nasal spray as needed.  Refills have been divided for levocetirizine and fluticasone nasal spray.  Allergic conjunctivitis  Treatment plan as outlined above for allergic rhinitis.  A refill prescription has been provided for Pazeo, one drop per eye daily as needed.  I have also recommended eye lubricant drops (i.e., Natural Tears) as needed.    Return in about 1 year (around 09/12/2017), or if symptoms worsen or fail to improve.

## 2016-09-12 NOTE — Assessment & Plan Note (Addendum)
Well-controlled on current regimen.  Continue appropriate allergen avoidance measures, levocetirizine 5 mg daily as needed, and fluticasone nasal spray as needed.  Refills have been divided for levocetirizine and fluticasone nasal spray.

## 2016-09-12 NOTE — Progress Notes (Signed)
Follow-up Note  RE: Veronica Huff MRN: [id number] DOB: 1986-08-29 Date of Office Visit: 09/12/2016  Primary care provider: Dorrene German, MD Referring provider: Fleet Contras, MD  History of present illness: Veronica Huff is a 30 y.o. female with allergic rhinoconjunctivitis presenting today for follow up.  She was last seen in this clinic in July 2016.  She is accompanied today by her nurse.  She reports that her nasal and ocular symptoms have been well-controlled over this past year with levocetirizine, fluticasone nasal spray, and Pazeo eyedrops.  She has no complaints today, just needs refills on her medications.   Assessment and plan: Allergic rhinitis Well-controlled on current regimen.  Continue appropriate allergen avoidance measures, levocetirizine 5 mg daily as needed, and fluticasone nasal spray as needed.  Refills have been divided for levocetirizine and fluticasone nasal spray.  Allergic conjunctivitis  Treatment plan as outlined above for allergic rhinitis.  A refill prescription has been provided for Pazeo, one drop per eye daily as needed.  I have also recommended eye lubricant drops (i.e., Natural Tears) as needed.    Meds ordered this encounter  Medications  . fluticasone (FLONASE) 50 MCG/ACT nasal spray    Sig: 2 Sprays into each nostril daily for stuffy nose or drainage.    Dispense:  16 g    Refill:  5  . Olopatadine HCl (PAZEO) 0.7 % SOLN    Sig: Place 1 drop into both eyes 1 day or 1 dose.    Dispense:  1 Bottle    Refill:  5  . levocetirizine (XYZAL) 5 MG tablet    Sig: Take one tablet by mouth daily as needed.    Dispense:  30 tablet    Refill:  5    Physical examination: Blood pressure 132/82, pulse 97, temperature 98.2 F (36.8 C), temperature source Oral, resp. rate 18, SpO2 96 %.  General: Alert, interactive, in no acute distress. HEENT: TMs pearly gray, turbinates mildly edematous without discharge, post-pharynx mildly  erythematous. Neck: Supple without lymphadenopathy. Lungs: Clear to auscultation without wheezing, rhonchi or rales. CV: Normal S1, S2 without murmurs. Skin: Warm and dry, without lesions or rashes.  The following portions of the patient's history were reviewed and updated as appropriate: allergies, current medications, past family history, past medical history, past social history, past surgical history and problem list.    Medication List       Accurate as of 09/12/16 12:27 PM. Always use your most recent med list.          acetaminophen 500 MG tablet Commonly known as:  TYLENOL Take 500 mg by mouth every 6 (six) hours as needed (pain).   baclofen 20 MG tablet Commonly known as:  LIORESAL Take 2 tablets (40 mg total) by mouth 4 (four) times daily.   bisacodyl 10 MG suppository Commonly known as:  DULCOLAX Place 10 mg rectally every other day as needed. constipation   bismuth subsalicylate 262 MG/15ML suspension Commonly known as:  PEPTO BISMOL Take 30 mLs by mouth every 6 (six) hours as needed.   dantrolene 100 MG capsule Commonly known as:  DANTRIUM TAKE 1 CAPSULE(100 MG) BY MOUTH THREE TIMES DAILY   Dextromethorphan-Guaifenesin 5-100 MG/5ML Liqd Take 5 mLs by mouth 2 (two) times daily as needed.   diphenhydrAMINE 25 MG tablet Commonly known as:  SOMINEX Take 25 mg by mouth every 6 (six) hours as needed for sleep.   EPIPEN 2-PAK 0.3 mg/0.3 mL Soaj injection Generic drug:  EPINEPHrine  USE UTD FOR ALLERGIC REACTION   fluticasone 50 MCG/ACT nasal spray Commonly known as:  FLONASE 2 Sprays into each nostril daily for stuffy nose or drainage.   guaiFENesin 600 MG 12 hr tablet Commonly known as:  MUCINEX Take 600 mg by mouth 2 (two) times daily as needed (1-2 tab BID PRN).   hydrocortisone cream 0.5 % Apply 1 application topically 2 (two) times daily. To hairline and brows   ibuprofen 200 MG tablet Commonly known as:  ADVIL,MOTRIN Take 400 mg by mouth every 6  (six) hours as needed.   imipramine 50 MG tablet Commonly known as:  TOFRANIL TAKE 2 TABLETS(100 MG) BY MOUTH AT BEDTIME   ketoconazole 2 % shampoo Commonly known as:  NIZORAL Apply 1 application topically 2 (two) times a week.   levocetirizine 5 MG tablet Commonly known as:  XYZAL Take one tablet by mouth daily as needed.   loperamide 2 MG tablet Commonly known as:  IMODIUM A-D Take 4-8 mg by mouth daily as needed for diarrhea or loose stools (not to exceed 4 tablets in 24 hours).   medroxyPROGESTERone 150 MG/ML injection Commonly known as:  DEPO-PROVERA INJ 1 ML IM Q 13 WEEKS   multivitamin tablet Take 1 tablet by mouth daily.   mupirocin ointment 2 % Commonly known as:  BACTROBAN 1 application by Tracheal Tube route daily. Applies to trach-stoma site   NEOSPORIN GU IRRIGANT Soln Generic drug:  neomycin-polymyxin B 30-40 mLs by GU irrigant route at bedtime. Instilled thru I&O catheter at bedtime-to prevent uti   nitrofurantoin 50 MG capsule Commonly known as:  MACRODANTIN Take 50 mg by mouth at bedtime.   nystatin powder Commonly known as:  MYCOSTATIN/NYSTOP Apply topically 2 (two) times daily as needed. Small amount   Olopatadine HCl 0.7 % Soln Commonly known as:  PAZEO Place 1 drop into both eyes 1 day or 1 dose.   polyethylene glycol packet Commonly known as:  MIRALAX / GLYCOLAX Take 17 g by mouth daily.   promethazine 25 MG suppository Commonly known as:  PHENERGAN Place 25 mg rectally every 6 (six) hours as needed for nausea or vomiting.   sertraline 100 MG tablet Commonly known as:  ZOLOFT Take 100 mg by mouth at bedtime.   solifenacin 10 MG tablet Commonly known as:  VESICARE Take 10 mg by mouth daily. Every am   sucralfate 1 GM/10ML suspension Commonly known as:  CARAFATE Take 1 g by mouth 3 (three) times daily.   topiramate 25 MG tablet Commonly known as:  TOPAMAX Take 1-2 tablets (25-50 mg total) by mouth at bedtime.       Allergies    Allergen Reactions  . Bee Venom Shortness Of Breath and Swelling    Anaphylactic shock  . Amoxicillin Hives  . Augmentin [Amoxicillin-Pot Clavulanate] Hives  . Erythromycin   . Morphine And Related Other (See Comments)    Goes crazy  . Trazodone And Nefazodone   . Ciprofloxacin Rash and Hives    hives  . Erythromycin Base Rash and Hives  . Tobramycin Rash and Hives    I appreciate the opportunity to take part in [place]'s care. Please do not hesitate to contact me with questions.  Sincerely,   R. Jorene Guestarter Holy B[place], MD

## 2016-09-19 ENCOUNTER — Other Ambulatory Visit: Payer: Self-pay | Admitting: Pediatrics

## 2016-09-26 ENCOUNTER — Encounter (HOSPITAL_COMMUNITY)
Admission: RE | Admit: 2016-09-26 | Discharge: 2016-09-26 | Disposition: A | Discharge status: Home / Self Care | Payer: Medicaid Other | Source: Ambulatory Visit | Attending: Otolaryngology | Admitting: Otolaryngology

## 2016-09-26 ENCOUNTER — Telehealth: Payer: Self-pay | Admitting: Allergy and Immunology

## 2016-09-26 ENCOUNTER — Encounter (HOSPITAL_COMMUNITY): Payer: Self-pay

## 2016-09-26 ENCOUNTER — Other Ambulatory Visit: Payer: Self-pay | Admitting: Pediatrics

## 2016-09-26 ENCOUNTER — Other Ambulatory Visit: Payer: Self-pay | Admitting: *Deleted

## 2016-09-26 DIAGNOSIS — G8250 Quadriplegia, unspecified: Secondary | ICD-10-CM | POA: Diagnosis not present

## 2016-09-26 DIAGNOSIS — G4733 Obstructive sleep apnea (adult) (pediatric): Secondary | ICD-10-CM | POA: Diagnosis not present

## 2016-09-26 DIAGNOSIS — J9611 Chronic respiratory failure with hypoxia: Secondary | ICD-10-CM | POA: Insufficient documentation

## 2016-09-26 DIAGNOSIS — Z930 Tracheostomy status: Secondary | ICD-10-CM | POA: Diagnosis not present

## 2016-09-26 DIAGNOSIS — Z9049 Acquired absence of other specified parts of digestive tract: Secondary | ICD-10-CM | POA: Insufficient documentation

## 2016-09-26 DIAGNOSIS — Z01812 Encounter for preprocedural laboratory examination: Secondary | ICD-10-CM | POA: Insufficient documentation

## 2016-09-26 DIAGNOSIS — I69369 Other paralytic syndrome following cerebral infarction affecting unspecified side: Secondary | ICD-10-CM | POA: Diagnosis not present

## 2016-09-26 DIAGNOSIS — H1013 Acute atopic conjunctivitis, bilateral: Secondary | ICD-10-CM

## 2016-09-26 DIAGNOSIS — Z87440 Personal history of urinary (tract) infections: Secondary | ICD-10-CM | POA: Diagnosis not present

## 2016-09-26 HISTORY — DX: Pneumonia, unspecified organism: J18.9

## 2016-09-26 HISTORY — DX: Anxiety disorder, unspecified: F41.9

## 2016-09-26 LAB — CBC
HEMATOCRIT: 44.6 % (ref 36.0–46.0)
HEMOGLOBIN: 15.1 g/dL — AB (ref 12.0–15.0)
MCH: 29.3 pg (ref 26.0–34.0)
MCHC: 33.9 g/dL (ref 30.0–36.0)
MCV: 86.6 fL (ref 78.0–100.0)
Platelets: 131 10*3/uL — ABNORMAL LOW (ref 150–400)
RBC: 5.15 MIL/uL — AB (ref 3.87–5.11)
RDW: 13.6 % (ref 11.5–15.5)
WBC: 5 10*3/uL (ref 4.0–10.5)

## 2016-09-26 LAB — BASIC METABOLIC PANEL
ANION GAP: 9 (ref 5–15)
BUN: 8 mg/dL (ref 6–20)
CHLORIDE: 105 mmol/L (ref 101–111)
CO2: 24 mmol/L (ref 22–32)
Calcium: 9.5 mg/dL (ref 8.9–10.3)
Creatinine, Ser: 0.39 mg/dL — ABNORMAL LOW (ref 0.44–1.00)
GFR calc non Af Amer: >60 mL/min (ref >60)
Glucose, Bld: 76 mg/dL (ref 65–99)
POTASSIUM: 4 mmol/L (ref 3.5–5.1)
SODIUM: 138 mmol/L (ref 135–145)

## 2016-09-26 LAB — HCG, SERUM, QUALITATIVE: PREG SERUM: NEGATIVE

## 2016-09-26 MED ORDER — OLOPATADINE HCL 0.1 % OP SOLN: 1.0000 [drp] | Freq: Two times a day (BID) | OPHTHALMIC | 4 refills | Status: DC

## 2016-09-26 MED ORDER — OLOPATADINE HCL 0.7 % OP SOLN: 1.0000 [drp] | OPHTHALMIC | 5 refills | Status: DC

## 2016-09-26 NOTE — Pre-Procedure Instructions (Signed)
Veronica Huff  09/26/2016      [facility] [id number] [name]- [place], [place] - [address] [facility] [address] [place] [id number] Phone: [phone] Fax: [phone]    Your procedure is scheduled on Nov 8  [facility] at [facility]30 A.M.  Call this number if you have problems the morning of surgery:  [phone]   Remember:  Do not eat food or drink liquids after midnight.  Take these medicines the morning of surgery with A SIP OF WATER Tylenol if needed, Baclofen (Lioresal), Dantrolene (Dantrium), Flonase nasal spray, Levocetirizine (Xyzal) if needed, Olopatadine HCL (pazeo) eye drops, solifenacin (Zoloft), Sucralfate (Carafate)  Stop taking aspirin, Ibuprofen, Advil, Motrin, Aleve, Herbal medications, Fish oil, Vitamins   Do not wear jewelry, make-up or nail polish.  Do not wear lotions, powders, or perfumes, or deoderant.  Do not shave 48 hours prior to surgery.  Men may shave face and neck.  Do not bring valuables to the hospital.  [facility] is not responsible for any belongings or valuables.  Contacts, dentures or bridgework may not be worn into surgery.  Leave your suitcase in the car.  After surgery it may be brought to your room.  For patients admitted to the hospital, discharge time will be determined by your treatment team.  Patients discharged the day of surgery will not be allowed to drive home.   Special instructions:  [place] - Preparing for Surgery  Before surgery, you can play an important role.  Because skin is not sterile, your skin needs to be as free of germs as possible.  You can reduce the number of germs on you skin by washing with CHG (chlorahexidine gluconate) soap before surgery.  CHG is an antiseptic cleaner which kills germs and bonds with the skin to continue killing germs even after washing.  Please DO NOT use if you have an allergy to CHG or antibacterial  soaps.  If your skin becomes reddened/irritated stop using the CHG and inform your nurse when you arrive at Short Stay.  Do not shave (including legs and underarms) for at least 48 hours prior to the first CHG shower.  You may shave your face.  Please follow these instructions carefully:   1.  Shower with CHG Soap the night before surgery and the    morning of Surgery.  2.  If you choose to wash your hair, wash your hair first as usual with your normal shampoo.  3.  After you shampoo, rinse your hair and body thoroughly to remove the     Shampoo.  4.  Use CHG as you would any other liquid soap.  You can apply chg directly  to the skin and wash gently with scrungie or a clean washcloth.  5.  Apply the CHG Soap to your body ONLY FROM THE NECK DOWN.    Do not use on open wounds or open sores.  Avoid contact with your eyes,       ears, mouth and genitals (private parts).  Wash genitals (private parts)    with your normal soap.  6.  Wash thoroughly, paying special attention to the area where your surgery   will be performed.  7.  Thoroughly rinse your body with warm water from the neck down.  8.  DO NOT shower/wash with your normal soap after using and rinsing off the CHG Soap.  9.  Pat yourself dry with a clean towel.            10.  Wear clean pajamas.            11.  Place clean sheets on your bed the night of your first shower and do not  sleep with pets.  Day of Surgery  Do not apply any lotions/deoderants the morning of surgery.  Please wear clean clothes to the hospital/surgery center.     Please read over the following fact sheets that you were given. Pain Booklet, Coughing and Deep Breathing and Surgical Site Infection Prevention

## 2016-09-26 NOTE — Progress Notes (Signed)
Anesthesia Chart Review:  Pt is a 30 year old female scheduled for microlaryngoscopy with laser and balloon dilation on 09/27/2016 with Christia Readingwight Bates, MD.   - Pulmonologist is Coralyn HellingVineet Sood, MD, last office visit 08/16/16.  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  Never smoker. BMI 22.  S/p cholecystectomy 11/18/12.    Medications include: baclofen, dantrolene, nitrofurantoin.   Preoperative labs reviewed.    By notes from Dr. Jenne PaneBates (in care everywhere), pt is trach dependent, has trach stenosis, and has pharyngeal dysphagia.  Pt is a difficult IV stick. Labs at PAT were obtained from her foot. Pt reports IVs have been placed in her feet in the past.   If no changes, I anticipate pt can proceed with surgery as scheduled.   Rica Mastngela Orrie Lascano, FNP-BC [facility]iology Phone: [phone] 09/26/2016 2:57 PM

## 2016-09-26 NOTE — Telephone Encounter (Signed)
Her Pazeo was denied for refills. Can you please send this in for her. Please give her a call either way.  [facility] [address]

## 2016-09-26 NOTE — Progress Notes (Addendum)
PCp is Dr. Concepcion ElkAvbuere Denies seeing a cardiologist. Denies having a stress test, card cath, or echo. Reports she is a difficult stick and had to have IV placed in her feet. Uses SIMV and pressure support at night due to sleep apnea.  States she had a sleep study many years ago with Dr Maple HudsonYoung. Pt states she usually takes Dilaudid for pain.

## 2016-09-27 ENCOUNTER — Encounter (HOSPITAL_COMMUNITY): Payer: Self-pay | Admitting: Anesthesiology

## 2016-09-27 ENCOUNTER — Ambulatory Visit (HOSPITAL_COMMUNITY): Payer: Medicaid Other | Admitting: Emergency Medicine

## 2016-09-27 ENCOUNTER — Ambulatory Visit (HOSPITAL_COMMUNITY)
Admission: RE | Admit: 2016-09-27 | Discharge: 2016-09-27 | Disposition: A | Discharge status: Home / Self Care | Payer: Medicaid Other | Source: Ambulatory Visit | Attending: Otolaryngology | Admitting: Otolaryngology

## 2016-09-27 ENCOUNTER — Encounter (HOSPITAL_COMMUNITY)
Admission: RE | Disposition: A | Discharge status: Home / Self Care | Payer: Self-pay | Source: Ambulatory Visit | Attending: Otolaryngology

## 2016-09-27 ENCOUNTER — Ambulatory Visit (HOSPITAL_COMMUNITY): Payer: Medicaid Other | Admitting: Anesthesiology

## 2016-09-27 DIAGNOSIS — Z87440 Personal history of urinary (tract) infections: Secondary | ICD-10-CM | POA: Diagnosis not present

## 2016-09-27 DIAGNOSIS — F419 Anxiety disorder, unspecified: Secondary | ICD-10-CM | POA: Insufficient documentation

## 2016-09-27 DIAGNOSIS — G8250 Quadriplegia, unspecified: Secondary | ICD-10-CM | POA: Insufficient documentation

## 2016-09-27 DIAGNOSIS — I69365 Other paralytic syndrome following cerebral infarction, bilateral: Secondary | ICD-10-CM | POA: Diagnosis not present

## 2016-09-27 DIAGNOSIS — Z880 Allergy status to penicillin: Secondary | ICD-10-CM | POA: Diagnosis not present

## 2016-09-27 DIAGNOSIS — F329 Major depressive disorder, single episode, unspecified: Secondary | ICD-10-CM | POA: Diagnosis not present

## 2016-09-27 DIAGNOSIS — Z930 Tracheostomy status: Secondary | ICD-10-CM | POA: Diagnosis not present

## 2016-09-27 DIAGNOSIS — Z809 Family history of malignant neoplasm, unspecified: Secondary | ICD-10-CM | POA: Diagnosis not present

## 2016-09-27 DIAGNOSIS — Z885 Allergy status to narcotic agent status: Secondary | ICD-10-CM | POA: Insufficient documentation

## 2016-09-27 DIAGNOSIS — Z888 Allergy status to other drugs, medicaments and biological substances status: Secondary | ICD-10-CM | POA: Diagnosis not present

## 2016-09-27 DIAGNOSIS — Z833 Family history of diabetes mellitus: Secondary | ICD-10-CM | POA: Insufficient documentation

## 2016-09-27 DIAGNOSIS — J398 Other specified diseases of upper respiratory tract: Secondary | ICD-10-CM | POA: Insufficient documentation

## 2016-09-27 DIAGNOSIS — Z91030 Bee allergy status: Secondary | ICD-10-CM | POA: Diagnosis not present

## 2016-09-27 DIAGNOSIS — Z8249 Family history of ischemic heart disease and other diseases of the circulatory system: Secondary | ICD-10-CM | POA: Diagnosis not present

## 2016-09-27 DIAGNOSIS — G4730 Sleep apnea, unspecified: Secondary | ICD-10-CM | POA: Diagnosis not present

## 2016-09-27 DIAGNOSIS — Z881 Allergy status to other antibiotic agents status: Secondary | ICD-10-CM | POA: Diagnosis not present

## 2016-09-27 HISTORY — PX: MICROLARYNGOSCOPY WITH LASER AND BALLOON DILATION: SHX5973

## 2016-09-27 SURGERY — MICROLARYNGOSCOPY WITH LASER AND BALLOON DILATION
Anesthesia: General | Site: Throat

## 2016-09-27 MED ORDER — MIDAZOLAM HCL 5 MG/5ML IJ SOLN
INTRAMUSCULAR | Status: DC | PRN
  Administered 2016-09-27: 1 mg via INTRAVENOUS

## 2016-09-27 MED ORDER — EPINEPHRINE HCL (NASAL) 0.1 % NA SOLN
NASAL | Status: AC
  Filled 2016-09-27: qty 30

## 2016-09-27 MED ORDER — LIDOCAINE HCL (CARDIAC) 20 MG/ML IV SOLN
INTRAVENOUS | Status: DC | PRN
  Administered 2016-09-27: 90 mg via INTRAVENOUS

## 2016-09-27 MED ORDER — GLYCOPYRROLATE 0.2 MG/ML IJ SOLN
INTRAMUSCULAR | Status: DC | PRN
  Administered 2016-09-27: 0.4 mg via INTRAVENOUS

## 2016-09-27 MED ORDER — ROCURONIUM BROMIDE 100 MG/10ML IV SOLN
INTRAVENOUS | Status: DC | PRN
  Administered 2016-09-27: 30 mg via INTRAVENOUS

## 2016-09-27 MED ORDER — PROMETHAZINE HCL 25 MG/ML IJ SOLN: 6.2500 mg | INTRAMUSCULAR | Status: DC | PRN

## 2016-09-27 MED ORDER — SODIUM CHLORIDE 0.9 % IV SOLN
0.0125 ug/kg/min | INTRAVENOUS | Status: DC
  Filled 2016-09-27: qty 2000

## 2016-09-27 MED ORDER — MIDAZOLAM HCL 2 MG/2ML IJ SOLN
INTRAMUSCULAR | Status: AC
  Filled 2016-09-27: qty 2

## 2016-09-27 MED ORDER — MIDAZOLAM HCL 2 MG/2ML IJ SOLN
INTRAMUSCULAR | Status: AC
  Administered 2016-09-27: 1 mg
  Filled 2016-09-27: qty 2

## 2016-09-27 MED ORDER — ARTIFICIAL TEARS OP OINT
TOPICAL_OINTMENT | OPHTHALMIC | Status: DC | PRN
  Administered 2016-09-27: 1 via OPHTHALMIC

## 2016-09-27 MED ORDER — DEXAMETHASONE SODIUM PHOSPHATE 10 MG/ML IJ SOLN
INTRAMUSCULAR | Status: DC | PRN
  Administered 2016-09-27: 5 mg via INTRAVENOUS

## 2016-09-27 MED ORDER — PHENYLEPHRINE HCL 10 MG/ML IJ SOLN
INTRAVENOUS | Status: DC | PRN
  Administered 2016-09-27: 50 ug/min via INTRAVENOUS

## 2016-09-27 MED ORDER — REMIFENTANIL HCL 2 MG IV SOLR
INTRAVENOUS | Status: DC | PRN
  Administered 2016-09-27: .1 ug/kg/min via INTRAVENOUS

## 2016-09-27 MED ORDER — HYDROMORPHONE HCL 1 MG/ML IJ SOLN
0.2500 mg | INTRAMUSCULAR | Status: DC | PRN
  Administered 2016-09-27: 0.5 mg via INTRAVENOUS

## 2016-09-27 MED ORDER — ONDANSETRON HCL 4 MG/2ML IJ SOLN
INTRAMUSCULAR | Status: DC | PRN
  Administered 2016-09-27: 4 mg via INTRAVENOUS

## 2016-09-27 MED ORDER — 0.9 % SODIUM CHLORIDE (POUR BTL) OPTIME
TOPICAL | Status: DC | PRN
  Administered 2016-09-27: 1000 mL

## 2016-09-27 MED ORDER — FENTANYL CITRATE (PF) 100 MCG/2ML IJ SOLN
INTRAMUSCULAR | Status: AC
  Filled 2016-09-27: qty 2

## 2016-09-27 MED ORDER — CHLORHEXIDINE GLUCONATE CLOTH 2 % EX PADS: 6.0000 | MEDICATED_PAD | Freq: Once | CUTANEOUS | Status: DC

## 2016-09-27 MED ORDER — HYDROMORPHONE HCL 2 MG/ML IJ SOLN
INTRAMUSCULAR | Status: AC
  Administered 2016-09-27: 0.5 mg
  Filled 2016-09-27: qty 1

## 2016-09-27 MED ORDER — LACTATED RINGERS IV SOLN
INTRAVENOUS | Status: DC
  Administered 2016-09-27 (×2): via INTRAVENOUS

## 2016-09-27 MED ORDER — PROPOFOL 10 MG/ML IV BOLUS
INTRAVENOUS | Status: AC
  Filled 2016-09-27: qty 20

## 2016-09-27 MED ORDER — PROPOFOL 10 MG/ML IV BOLUS
INTRAVENOUS | Status: DC | PRN
  Administered 2016-09-27: 140 mg via INTRAVENOUS

## 2016-09-27 MED ORDER — PROPOFOL 500 MG/50ML IV EMUL
INTRAVENOUS | Status: DC | PRN
  Administered 2016-09-27: 100 ug/kg/min via INTRAVENOUS

## 2016-09-27 MED ORDER — NEOSTIGMINE METHYLSULFATE 10 MG/10ML IV SOLN
INTRAVENOUS | Status: DC | PRN
  Administered 2016-09-27: 3 mg via INTRAVENOUS

## 2016-09-27 SURGICAL SUPPLY — 25 items
APL SRG 3 HI ABS STRL LF PLS (MISCELLANEOUS) ×1
APPLICATOR DR MATTHEWS STRL (MISCELLANEOUS) ×1 IMPLANT
BALLN PULM 15 16.5 18X75 (BALLOONS) ×2
BALLOON PULM 15 16.5 18X75 (BALLOONS) IMPLANT
BANDAGE EYE OVAL (MISCELLANEOUS) ×2 IMPLANT
CANISTER SUCTION 2500CC (MISCELLANEOUS) ×2 IMPLANT
COVER MAYO STAND STRL (DRAPES) ×1 IMPLANT
COVER TABLE BACK 60X90 (DRAPES) ×2 IMPLANT
CRADLE DONUT ADULT HEAD (MISCELLANEOUS) ×2 IMPLANT
DEPRESSOR TONGUE BLADE STERILE (MISCELLANEOUS) ×1 IMPLANT
DRAPE PROXIMA HALF (DRAPES) ×2 IMPLANT
GAUZE SPONGE 4X4 12PLY STRL (GAUZE/BANDAGES/DRESSINGS) ×3 IMPLANT
GLOVE BIO SURGEON STRL SZ7.5 (GLOVE) ×2 IMPLANT
KIT BASIN OR (CUSTOM PROCEDURE TRAY) ×2 IMPLANT
KIT ROOM TURNOVER OR (KITS) ×2 IMPLANT
MARKER SKIN DUAL TIP RULER LAB (MISCELLANEOUS) ×2 IMPLANT
NS IRRIG 1000ML POUR BTL (IV SOLUTION) ×2 IMPLANT
PAD ARMBOARD 7.5X6 YLW CONV (MISCELLANEOUS) ×4 IMPLANT
PATTIES SURGICAL .5 X3 (DISPOSABLE) ×2 IMPLANT
SOLUTION ANTI FOG 6CC (MISCELLANEOUS) ×2 IMPLANT
SUT SILK 2 0 FS (SUTURE) IMPLANT
SYR INFLATE BILIARY GAUGE (MISCELLANEOUS) ×1 IMPLANT
TOWEL OR 17X24 6PK STRL BLUE (TOWEL DISPOSABLE) ×4 IMPLANT
TUBE CONNECTING 12X1/4 (SUCTIONS) ×2 IMPLANT
WATER STERILE IRR 1000ML POUR (IV SOLUTION) ×2 IMPLANT

## 2016-09-27 NOTE — Anesthesia Preprocedure Evaluation (Addendum)
Anesthesia Evaluation  Patient identified by MRN, date of birth, ID band Patient awake    Reviewed: Allergy & Precautions, H&P , NPO status , Patient's Chart, lab work & pertinent test results  History of Anesthesia Complications Negative for: history of anesthetic complications  Airway Mallampati: Trach       Dental no notable dental hx.    Pulmonary sleep apnea ,     + decreased breath sounds      Cardiovascular negative cardio ROS Normal cardiovascular exam     Neuro/Psych Quadriplegic  CVA, Residual Symptoms negative psych ROS   GI/Hepatic negative GI ROS, Neg liver ROS,   Endo/Other  negative endocrine ROS  Renal/GU negative Renal ROS  negative genitourinary   Musculoskeletal negative musculoskeletal ROS (+)   Abdominal   Peds negative pediatric ROS (+)  Hematology negative hematology ROS (+)   Anesthesia Other Findings   Reproductive/Obstetrics negative OB ROS                            Anesthesia Physical  Anesthesia Plan  ASA: III  Anesthesia Plan: General   Post-op Pain Management:    Induction: Intravenous  Airway Management Planned: Tracheostomy  Additional Equipment:   Intra-op Plan:   Post-operative Plan:   Informed Consent: I have reviewed the patients History and Physical, chart, labs and discussed the procedure including the risks, benefits and alternatives for the proposed anesthesia with the patient or authorized representative who has indicated his/her understanding and acceptance.   Consent reviewed with POA  Plan Discussed with: CRNA, Surgeon and Anesthesiologist  Anesthesia Plan Comments:        Anesthesia Quick Evaluation

## 2016-09-27 NOTE — H&P (Signed)
Veronica Huff is an 30 y.o. female.   Chief Complaint: tracheostomy dependent HPI: 30 year old female with history of quadriplegia since age 76 and is tracheostomy dependent.  She had noticed more difficulty breathing around the trach tube when the speaking valve is in place.  She presents for airway evaluation and possible dilation.  Past Medical History:  Diagnosis Date  . Anxiety   . CVA (cerebral infarction)    Ischemic brainstem stroke in 2011 at age 32 due to unknown reasons--quadriplegic after stroke.  . Depression    Zoloft has helped-not having any current depression  . Edema extremities    Feet and legs-mostly in feet  . Headache(784.0)    Prior to imipramine-none currently  . Pneumonia   . Quadriplegia ([place])    Secondary to stroke at age 63-has trach-lives with boyfriend-motorized w/c-joystick to chin to drive w/c--I & O cath thru suprapubic stoma - has nursing care 16 hrs daily and boyfriend able to help pt the other hours of day.  . Recurrent UTI (urinary tract infection)    Due to suprapubic stoma, repetitive catheterizations  . Sleep apnea    On ventilator at night -has oxygen but rarely needs unless she has a cold and sats drop  . Stroke [facility])     Past Surgical History:  Procedure Laterality Date  . BLADDER SURGERY  2009,2012  . BRONCHOSCOPY  2006  . CHOLECYSTECTOMY  11/18/2012   Procedure: LAPAROSCOPIC CHOLECYSTECTOMY;  Surgeon: Ralene Ok, MD;  Location: [facility] ORS;  Service: General;  Laterality: N/A;  . ESOPHAGOGASTRODUODENOSCOPY    . GASTROSTOMY TUBE PLACEMENT  2000  . ILEOSTOMY  2006  . REMOVAL OF GASTROSTOMY TUBE    . SPINE SURGERY  2004,2011  . TENDON REPAIR  2012   Left wrist  . THROAT SURGERY  2004  . TRACHEOSTOMY  2000  . wirst surgery Left   . WISDOM TOOTH EXTRACTION  2008    Family History  Problem Relation Age of Onset  . Diabetes Mother   . Hypertension Mother   . Varicose Veins Mother   . Cancer Maternal Aunt   . Diabetes Maternal  Grandfather   . Heart disease Maternal Grandfather   . Heart disease Paternal Grandfather    Social History:  reports that she has never smoked. She has never used smokeless tobacco. She reports that she does not drink alcohol or use drugs.  Allergies:  Allergies  Allergen Reactions  . Bee Venom Shortness Of Breath and Swelling    SHOCK  . Amoxicillin Hives and Itching  . Augmentin [Amoxicillin-Pot Clavulanate] Hives and Itching  . Ciprofloxacin Hives and Rash  . Tobramycin Hives and Rash  . Erythromycin     UNSPECIFIED REACTION   . Trazodone And Nefazodone     UNSPECIFIED REACTION   . Morphine And Related Other (See Comments)    "GOES CRAZY"    Medications Prior to Admission  Medication Sig Dispense Refill  . acetaminophen (TYLENOL) 500 MG tablet Take 500 mg by mouth every 6 (six) hours as needed (pain).    Marland Kitchen aspirin-acetaminophen-caffeine (EXCEDRIN MIGRAINE) 250-250-65 MG tablet Take by mouth every 6 (six) hours as needed for headache.    . baclofen (LIORESAL) 20 MG tablet Take 2 tablets (40 mg total) by mouth 4 (four) times daily. 240 each 4  . bisacodyl (DULCOLAX) 10 MG suppository Place 10 mg rectally every other day as needed. constipation    . bismuth subsalicylate (PEPTO BISMOL) 262 MG/15ML suspension Take 30  mLs by mouth every 6 (six) hours as needed.    . dantrolene (DANTRIUM) 100 MG capsule TAKE 1 CAPSULE(100 MG) BY MOUTH THREE TIMES DAILY 90 capsule 5  . guaiFENesin (MUCINEX) 600 MG 12 hr tablet Take 600 mg by mouth 2 (two) times daily as needed (1-2 tab BID PRN).    Marland Kitchen ibuprofen (ADVIL,MOTRIN) 200 MG tablet Take 400 mg by mouth every 6 (six) hours as needed.    Marland Kitchen imipramine (TOFRANIL) 50 MG tablet TAKE 2 TABLETS(100 MG) BY MOUTH AT BEDTIME 60 tablet 1  . medroxyPROGESTERone (PROVERA) 10 MG tablet Take 10 mg by mouth daily. X 10 days    . Multiple Vitamin (MULTIVITAMIN) tablet Take 1 tablet by mouth daily.    . promethazine (PHENERGAN) 25 MG suppository Place 25 mg  rectally every 6 (six) hours as needed for nausea or vomiting.    . pseudoephedrine (SUDAFED) 120 MG 12 hr tablet Take 120 mg by mouth 2 (two) times daily as needed for congestion.    . sertraline (ZOLOFT) 100 MG tablet Take 100 mg by mouth at bedtime.    . solifenacin (VESICARE) 10 MG tablet Take 10 mg by mouth daily. Every am    . sucralfate (CARAFATE) 1 GM/10ML suspension Take 1 g by mouth 3 (three) times daily.     Marland Kitchen topiramate (TOPAMAX) 25 MG tablet Take 1-2 tablets (25-50 mg total) by mouth at bedtime. 60 tablet 5  . Dextromethorphan-Guaifenesin 5-100 MG/5ML LIQD Take 5 mLs by mouth 2 (two) times daily as needed.    . diphenhydrAMINE (BENADRYL) 25 mg capsule Take 50 mg by mouth every 4 (four) hours as needed for allergies.    . diphenhydrAMINE (SOMINEX) 25 MG tablet Take 25 mg by mouth every 6 (six) hours as needed for sleep.    Marland Kitchen EPIPEN 2-PAK 0.3 MG/0.3ML SOAJ injection USE UTD FOR ALLERGIC REACTION  1  . fluticasone (FLONASE) 50 MCG/ACT nasal spray 2 Sprays into each nostril daily for stuffy nose or drainage. 16 g 5  . hydrocortisone cream 0.5 % Apply 1 application topically 2 (two) times daily. To hairline and brows    . ketoconazole (NIZORAL) 2 % shampoo Apply 1 application topically 2 (two) times a week.    . levocetirizine (XYZAL) 5 MG tablet Take one tablet by mouth daily as needed. 30 tablet 5  . loperamide (IMODIUM A-D) 2 MG tablet Take 4-8 mg by mouth daily as needed for diarrhea or loose stools (not to exceed 4 tablets in 24 hours).    . medroxyPROGESTERone (DEPO-PROVERA) 150 MG/ML injection INJ 1 ML IM Q 13 WEEKS  3  . Methen-Hyosc-Meth Blue-Na Phos (UROGESIC-BLUE) 81.6 MG TABS Take 1 tablet by mouth 4 (four) times daily.    . mupirocin ointment (BACTROBAN) 2 % 1 application by Tracheal Tube route daily. Applies to trach-stoma site    . neomycin-polymyxin B (NEOSPORIN GU IRRIGANT) SOLN 30-40 mLs by GU irrigant route at bedtime. Instilled thru I&O catheter at bedtime-to prevent uti     . nitrofurantoin (MACRODANTIN) 50 MG capsule Take 50 mg by mouth at bedtime.    Marland Kitchen nystatin (MYCOSTATIN) powder Apply topically 2 (two) times daily as needed. Small amount    . olopatadine (PATANOL) 0.1 % ophthalmic solution Place 1 drop into both eyes 2 (two) times daily. 5 mL 4  . phenazopyridine (PYRIDIUM) 95 MG tablet Take 95 mg by mouth 3 (three) times daily as needed for pain.    . polyethylene glycol (MIRALAX / GLYCOLAX) packet  Take 17 g by mouth daily.      Results for orders placed or performed during the hospital encounter of 09/26/16 (from the past 48 hour(s))  Basic metabolic panel     Status: Abnormal   Collection Time: 09/26/16  1:22 PM  Result Value Ref Range   Sodium 138 135 - 145 mmol/L   Potassium 4.0 3.5 - 5.1 mmol/L   Chloride 105 101 - 111 mmol/L   CO2 24 22 - 32 mmol/L   Glucose, Bld 76 65 - 99 mg/dL   BUN 8 6 - 20 mg/dL   Creatinine, Ser 0.39 (L) 0.44 - 1.00 mg/dL   Calcium 9.5 8.9 - 10.3 mg/dL   GFR calc non Af Amer >60 >60 mL/min   GFR calc Af Amer >60 >60 mL/min    Comment: (NOTE) The eGFR has been calculated using the CKD EPI equation. This calculation has not been validated in all clinical situations. eGFR's persistently <60 mL/min signify possible Chronic Kidney Disease.    Anion gap 9 5 - 15  hCG, serum, qualitative     Status: None   Collection Time: 09/26/16  1:22 PM  Result Value Ref Range   Preg, Serum NEGATIVE NEGATIVE    Comment:        THE SENSITIVITY OF THIS METHODOLOGY IS >10 mIU/mL.   CBC     Status: Abnormal   Collection Time: 09/26/16  1:22 PM  Result Value Ref Range   WBC 5.0 4.0 - 10.5 K/uL   RBC 5.15 (H) 3.87 - 5.11 MIL/uL   Hemoglobin 15.1 (H) 12.0 - 15.0 g/dL   HCT 44.6 36.0 - 46.0 %   MCV 86.6 78.0 - 100.0 fL   MCH 29.3 26.0 - 34.0 pg   MCHC 33.9 30.0 - 36.0 g/dL   RDW 13.6 11.5 - 15.5 %   Platelets 131 (L) 150 - 400 K/uL   No results found.  Review of Systems  All other systems reviewed and are  negative.   Blood pressure 118/70, pulse (!) 101, temperature 98.8 F (37.1 C), temperature source Oral, resp. rate 18, weight 119 lb 14.9 oz (54.4 kg), last menstrual period 09/08/2016, SpO2 100 %. Physical Exam   Assessment/Plan Tracheostomy dependence, tracheal stenosis To OR for SMDL with CO2 laser dilation.  Melida Quitter, MD 09/27/2016, 11:33 AM

## 2016-09-27 NOTE — Transfer of Care (Signed)
Immediate Anesthesia Transfer of Care Note  Patient: Veronica Huff  Procedure(s) Performed: Procedure(s) with comments: MICROLARYNGOSCOPY WITH LASER AND BALLOON DILATION (N/A) - micro direct laryngoscopy with co2 laser balloon dilation of trachea  Patient Location: PACU  Anesthesia Type:General  Level of Consciousness: awake, alert  and oriented  Airway & Oxygen Therapy: Patient Spontanous Breathing and Patient connected to tracheostomy mask oxygen  Post-op Assessment: Report given to RN and Post -op Vital signs reviewed and stable  Post vital signs: Reviewed and stable  Last Vitals:  Vitals:   09/27/16 0909  BP: 118/70  Pulse: (!) 101  Resp: 18  Temp: 37.1 C    Last Pain:  Vitals:   09/27/16 0909  TempSrc: Oral         Complications: No apparent anesthesia complications.  Passey-muir valve and "Lamby" stuffed animal to PACU with patient.

## 2016-09-27 NOTE — Anesthesia Procedure Notes (Signed)
Date/Time: 09/27/2016 12:05 PM Performed by: Edmonia CaprioAUSTON, Stephnie Parlier M Pre-anesthesia Checklist: Patient identified, Emergency Drugs available, Suction available, Patient being monitored and Timeout performed Patient Re-evaluated:Patient Re-evaluated prior to inductionOxygen Delivery Method: Circle system utilized Preoxygenation: Pre-oxygenation with 100% oxygen Intubation Type: Tracheostomy and IV induction Laser Tube: Cuffed inflated with minimal occlusive pressure - saline Tube size: 5.0 mm Number of attempts: 3 Airway Equipment and Method: Tracheostomy Placement Confirmation: positive ETCO2,  CO2 detector and breath sounds checked- equal and bilateral Tube secured with: Tape Comments: Patient pre-02 through tracheostomy, IV induction.  Able to move air through uncuffed 5.0 Shiley.  Shiley removed and attempted to insert a 4.5 cuffed laser ETT, would not pass.  4.0 uncuffed laser ETT passed easily, difficult to ventilate through.  Dr. Jenne PaneBates at bedside.  5.0 cuffed ETT (non-laser, Dr. Jenne PaneBates aware) inserted through stoma and adequate ventilation achieved.  Fio2 to 21%.

## 2016-09-27 NOTE — Anesthesia Procedure Notes (Signed)
Performed by: Edmonia CaprioAUSTON, Patricie Geeslin M

## 2016-09-27 NOTE — Anesthesia Postprocedure Evaluation (Signed)
Anesthesia Post Note  Patient: Careers information officerAnanda R Huff  Procedure(s) Performed: Procedure(s) (LRB): MICROLARYNGOSCOPY WITH LASER AND BALLOON DILATION (N/A)  Patient location during evaluation: PACU Anesthesia Type: General Level of consciousness: sedated Pain management: pain level controlled Vital Signs Assessment: post-procedure vital signs reviewed and stable Respiratory status: spontaneous breathing and respiratory function stable Cardiovascular status: stable Anesthetic complications: no    Last Vitals:  Vitals:   09/27/16 1330 09/27/16 1345  BP:    Pulse: 92 93  Resp: 17 15  Temp: 36.1 C     Last Pain:  Vitals:   09/27/16 0909  TempSrc: Oral                 Kerron Sedano DANIEL

## 2016-09-27 NOTE — Brief Op Note (Signed)
09/27/2016  12:44 PM  PATIENT:  Veronica Huff  30 y.o. female  PRE-OPERATIVE DIAGNOSIS:  Tracheostomy dependence, tracheal stenosis  POST-OPERATIVE DIAGNOSIS:  same  PROCEDURE:  Procedure(s) with comments: MICROLARYNGOSCOPY WITH LASER AND BALLOON DILATION (N/A) - micro direct laryngoscopy with co2 laser balloon dilation of trachea  SURGEON:  Surgeon(s) and Role:    * Christia Readingwight Kacey Vicuna, MD - Primary  PHYSICIAN ASSISTANT:   ASSISTANTS: none   ANESTHESIA:   general  EBL:  No intake/output data recorded.  BLOOD ADMINISTERED:none  DRAINS: none   LOCAL MEDICATIONS USED:  NONE  SPECIMEN:  No Specimen  DISPOSITION OF SPECIMEN:  N/A  COUNTS:  YES  TOURNIQUET:  * No tourniquets in log *  DICTATION: .Other Dictation: Dictation Number [id number]  PLAN OF CARE: Discharge to home after PACU  PATIENT DISPOSITION:  PACU - hemodynamically stable.   Delay start of Pharmacological VTE agent (>24hrs) due to surgical blood loss or risk of bleeding: no

## 2016-09-28 ENCOUNTER — Encounter (HOSPITAL_COMMUNITY): Payer: Self-pay | Admitting: Otolaryngology

## 2016-09-28 NOTE — Op Note (Signed)
NAMAvis Epley:  Veronica Huff, Veronica Huff              ACCOUNT NO.:  [id number]  MEDICAL RECORD NO.:  [id number]  LOCATION:  [facility]                         FACILITY:  [facility]  PHYSICIAN:  Antony Contraswight D Quyen Cutsforth, MD     DATE OF BIRTH:  1986-05-10  DATE OF PROCEDURE:  09/27/2016 DATE OF DISCHARGE:  09/27/2016                              OPERATIVE REPORT   PREOPERATIVE DIAGNOSES: 1. Tracheostomy dependence. 2. Tracheal stenosis.  POSTOPERATIVE DIAGNOSES: 1. Tracheostomy dependence. 2. Tracheal stenosis.  PROCEDURE:  Suspended microdirect laryngoscopy with CO2 laser dilation of trachea.  SURGEON:  Excell SeltzerDwight D. Jenne PaneBates, M.D.  ANESTHESIA:  General endotracheal and jet Venturi ventilation.  COMPLICATIONS:  None.  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  She has noticed over the recent months that her voice is more difficult around the trach tube when her speaking valve is in place and so she presents to the operating room for surgical management.  FINDINGS:  The pharynx and larynx appeared normal.  Evaluating the upper trachea, at the level of the tracheal stoma, there was some narrowing of the airway to near complete blockage with soft tissues from either side, more from the left, narrowing the airway.  The endotracheal tube was able to be visualized by pushing the tissues laterally.  This area was dilated using the largest tracheal balloon dilator and then a portion of the left-sided soft tissue was removed by obliterating with the CO2 laser and then the dilation was repeated.  This resulted in a bit more open airway above the stoma.  DESCRIPTION OF PROCEDURE:  The patient was identified in the holding room, informed consent having been obtained including discussion of risks, benefits, alternatives, the patient was brought to the operative suite and put on the operative table in supine  position.  Anesthesia was induced.  The patient was maintained via endotracheal anesthesia by removing the indwelling tracheostomy tube and placing a size 5 cuffed endotracheal tube.  The eyes were taped closed and then covered with damp eye pads, and the bed was turned 90 degrees from anesthesia.  The patient was given intravenous steroids during the case.  A Storz laryngoscope was then placed into the glottic position after shoulder roll was first placed.  The laryngoscope was placed after placing a tooth guard also on the upper teeth.  With the laryngoscope in position, it was suspended to the Mayo stand using a Lewy arm.  A 0-degree telescope was then used to make a preoperative photograph of the airway. The operating microscope was then brought into view with CO2 laser attached.  The airway was suctioned and inspected.  Decision was made to proceed with the dilation first and so, the largest tracheal balloon was placed into the area with the endotracheal tube pulled back out of the stoma and the balloon was inflated to 7 atmospheres for about 45 seconds and then deflated.  The endotracheal tube was replaced and the patient ventilated again.  This opened up the narrow area somewhat.  Because the left-sided soft tissue was more dynamic, the decision was made to make a  wedge resection of some of the tissues using the CO2 laser.  To do this, the jet Venturi ventilation was attached and the patient was jet ventilated after removing the endotracheal tube.  The CO2 laser was then used to make a wedge incision, obliterative wedge removal of some of tissue on the left side being careful not to go too deep for worries of perforation of the tracheal wall.  There was no evidence of that during the case.  After this was completed, the airway was suctioned of char. The balloon dilator was repositioned and dilated for another minute at 7 atmospheres.  At this point, the balloon was removed and the  airway was suctioned.  The endotracheal tube was replaced through the stoma and the patient was ventilated successfully.  The 0-degree telescope was used to make a postoperative photograph.  The area of surgery was then sprayed with topical 2% lidocaine that was then suctioned.  The laryngoscope was taken out of suspension and removed from the patient's mouth while suctioning the airway.  She was returned to Anesthesia for wake-up and when breathing on her own, the endotracheal tube was exchanged once again for her previous indwelling tracheostomy tube, which was easily placed.  The tracheostomy tie was reapplied.  She was then moved to the recovery room in stable condition.     Antony Contraswight D Stella Bortle, MD     DDB/MEDQ  D:  09/27/2016  T:  09/28/2016  Job:  [id number]

## 2016-09-29 ENCOUNTER — Telehealth: Payer: Self-pay | Admitting: Allergy and Immunology

## 2016-09-29 NOTE — Telephone Encounter (Signed)
Dr. Nunzio CobbsBobbitt called in Pazeo 1% but she needed it for 7%. Can this be changed please? Please call her home health nurse Sharyl Nimrod([place]) and let her know when it is sent and if she needs to use the 1% instead. Her number [phone].  [place] [facility] [place] [facility] [place]

## 2016-09-29 NOTE — Telephone Encounter (Signed)
[facility](HH nurse) needed a verbal order for the eyedrop switch. Approved verbal orders.

## 2016-09-29 NOTE — Telephone Encounter (Signed)
[facility] [place] [phone] - LMOVM advising the eye drop strength of Patanol 0.1% is the same as the strength of Pazeo 7%. Advsd the eye drops were changed due to pt's insurance. Advsd to call back if she has any additional questions.

## 2016-09-29 NOTE — Telephone Encounter (Signed)
Her nurse called and needs to talk with one of the nurses. [phone].

## 2016-10-12 ENCOUNTER — Encounter: Payer: Self-pay | Admitting: Pulmonary Disease

## 2016-10-14 ENCOUNTER — Other Ambulatory Visit: Payer: Self-pay | Admitting: Physical Medicine & Rehabilitation

## 2016-10-14 DIAGNOSIS — R51 Headache: Principal | ICD-10-CM

## 2016-10-14 DIAGNOSIS — R519 Headache, unspecified: Secondary | ICD-10-CM

## 2016-10-18 ENCOUNTER — Telehealth: Payer: Self-pay | Admitting: Pulmonary Disease

## 2016-10-18 NOTE — Telephone Encounter (Signed)
ONO with home vent 10/12/16 >> test time 3 hrs 51 min.  Mean SpO2 92.9%, low SpO2 74%.  Spent 14 min with SpO2 < 88%.   Will have my nurse inform pt that ONO looked okay.  No change to current set up needed.

## 2016-10-19 NOTE — Telephone Encounter (Signed)
LM x 1 

## 2016-10-25 ENCOUNTER — Encounter: Payer: Self-pay | Admitting: Physical Medicine & Rehabilitation

## 2016-10-25 ENCOUNTER — Encounter: Payer: Medicaid Other | Attending: Physical Medicine & Rehabilitation | Admitting: Physical Medicine & Rehabilitation

## 2016-10-25 VITALS — BP 110/78 | HR 97 | Resp 14

## 2016-10-25 DIAGNOSIS — I639 Cerebral infarction, unspecified: Secondary | ICD-10-CM

## 2016-10-25 DIAGNOSIS — I638 Other cerebral infarction: Secondary | ICD-10-CM

## 2016-10-25 DIAGNOSIS — G8250 Quadriplegia, unspecified: Secondary | ICD-10-CM | POA: Diagnosis not present

## 2016-10-25 NOTE — Patient Instructions (Signed)
PLEASE CALL ME WITH ANY PROBLEMS OR QUESTIONS ([phone])   HAPPY HOLIDAYS!!!!                    *                * *             *   *   *         *  *   *  *  *     *  *  *  *  *  *  * *  *  *  *  *  *  *  *  *  * *               *  *               *  *               *  *  

## 2016-10-25 NOTE — Progress Notes (Signed)
Subjective:    Patient ID: Veronica Huff, female    DOB: 07/01/1986, 30 y.o.   MRN: [id number]  HPI   [name] is here in follow up of her spastic tetraplegia. She had a balloon dilatation of her trachea on 11/8 by Dr. Jenne PaneBates which has helped her breathing and speech immensely. We had tried to decrease her baclofen to 30mg  QID with taper after last visit but this did not work well for her. She has since gone back to the 40mg  qid dosing. Along with the botox, her lower extremitiy spasticity has been better.   From a health standpoint, things have been stable otherwise. Her new w/c has been working well for her and fits appropriatley.    Pain Inventory Average Pain 0 Pain Right Now 0 My pain is no pain  In the last 24 hours, has pain interfered with the following? General activity 0 Relation with others 0 Enjoyment of life 0 What TIME of day is your pain at its worst? no pain Sleep (in general) Good  Pain is worse with: no pain Pain improves with: no pain Relief from Meds: no pain  Mobility use a wheelchair needs help with transfers  Function disabled: date disabled .  Neuro/Psych bladder control problems bowel control problems  Prior Studies Any changes since last visit?  no  Physicians involved in your care Any changes since last visit?  no    Family History  Problem Relation Age of Onset  . Diabetes Mother   . Hypertension Mother   . Varicose Veins Mother   . Cancer Maternal Aunt   . Diabetes Maternal Grandfather   . Heart disease Maternal Grandfather   . Heart disease Paternal Grandfather    Social History   Social History  . Marital status: Single    Spouse name: N/A  . Number of children: N/A  . Years of education: college    Occupational History  . disabled    Social History Main Topics  . Smoking status: Never Smoker  . Smokeless tobacco: Never Used  . Alcohol use No  . Drug use: No  . Sexual activity: Yes    Birth control/ protection:  None   Other Topics Concern  . None   Social History Narrative   Lives at home with boyfriend   Has a RN [employer] that helps to care for her    Drinks no caffeine    Past Surgical History:  Procedure Laterality Date  . BLADDER SURGERY  2009,2012  . BRONCHOSCOPY  2006  . CHOLECYSTECTOMY  11/18/2012   Procedure: LAPAROSCOPIC CHOLECYSTECTOMY;  Surgeon: Axel FillerArmando Ramirez, MD;  Location: [facility];  Service: General;  Laterality: N/A;  . ESOPHAGOGASTRODUODENOSCOPY    . GASTROSTOMY TUBE PLACEMENT  2000  . ILEOSTOMY  2006  . MICROLARYNGOSCOPY WITH LASER AND BALLOON DILATION N/A 09/27/2016   Procedure: MICROLARYNGOSCOPY WITH LASER AND BALLOON DILATION;  Surgeon: Christia Readingwight Bates, MD;  [facility];  Service: ENT;  Laterality: N/A;  micro direct laryngoscopy with co2 laser balloon dilation of trachea  . REMOVAL OF GASTROSTOMY TUBE    . SPINE SURGERY  2004,2011  . TENDON REPAIR  2012   Left wrist  . THROAT SURGERY  2004  . TRACHEOSTOMY  2000  . wirst surgery Left   . WISDOM TOOTH EXTRACTION  2008   Past Medical History:  Diagnosis Date  . Anxiety   . CVA (cerebral infarction)    Ischemic brainstem stroke in 2011 at age 30 due  to unknown reasons--quadriplegic after stroke.  . Depression    Zoloft has helped-not having any current depression  . Edema extremities    Feet and legs-mostly in feet  . Headache(784.0)    Prior to imipramine-none currently  . Pneumonia   . Quadriplegia (HCC)    Secondary to stroke at age 30-has trach-lives with boyfriend-motorized w/c-joystick to chin to drive w/c--I & O cath thru suprapubic stoma - has nursing care 16 hrs daily and boyfriend able to help pt the other hours of day.  . Recurrent UTI (urinary tract infection)    Due to suprapubic stoma, repetitive catheterizations  . Sleep apnea    On ventilator at night -has oxygen but rarely needs unless she has a cold and sats drop  . Stroke (HCC)    BP 110/78   Pulse 97   Resp 14   LMP 09/08/2016   SpO2  97%   Opioid Risk Score:   Fall Risk Score:  `1  Depression screen PHQ 2/9  Depression screen [facility] 02/15/2016 07/30/2015  Decreased Interest 0 0  Down, Depressed, Hopeless 1 0  PHQ - 2 Score 1 0  Altered sleeping 1 0  Tired, decreased energy 1 0  Change in appetite 0 0  Feeling bad or failure about yourself  1 0  Trouble concentrating 1 0  Moving slowly or fidgety/restless 0 0  Suicidal thoughts 0 0  PHQ-9 Score 5 0    Review of Systems  Constitutional: Negative.   HENT: Negative.   Eyes: Negative.   Respiratory: Negative.   Cardiovascular: Negative.   Gastrointestinal:       Neurogenic bowels  Endocrine: Negative.   Genitourinary:       Neurogenic bladder  Musculoskeletal: Negative.   Skin: Negative.   Allergic/Immunologic: Negative.   Neurological: Negative.   Hematological: Negative.   Psychiatric/Behavioral: Negative.   All other systems reviewed and are negative.      Objective:   Physical Exam  General: Alert and oriented x 3, in powered w/c  HEENT: trach in place, using breath controlled joy stick. Vocal quality is better,  Neck: no JVD Heart: RRR  Chest: CTA bilaterally without wheezes, rales, or rhonchi; no distress  Abdomen: Soft, non-tender, slightly-distended, bowel sounds positive. Has binder  Extremities: No clubbing, cyanosis, or edema. Pulses are 2+  Skin: Clean and intact without signs of breakdown  Neuro: Pt is cognitively appropriate with normal insight, memory, and awareness. Intact pain and general touch below the neck. No voluntary muscle function in any of her four limbs. DTR's 3+, she has sustained clonus in both lower ext. MAS: 1-2/4 biceps bilat with contracture at end rom, right arm remains supinated with wrist extended. tone--2/4. LUE pronated at forearm/wrist with 3-4/4 tone.tr/4 quads, gastrocs. Hamstrings without resting tone today. Clonus sustained in each lower extremity with ROM. pecs bilaterally are 1/4---mild capsulitis.  Hip adductors without tone today  Musculoskeletal: Full ROM, No pain with AROM or PROM in the neck, trunk, or extremities. Posture appropriate  Psych: Pts affect appropriate. pt is pleasant      Assessment & Plan:  ASSESSMENT:  1. 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  2. 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  3. Urinary retention and incontinence.  4. Chronic nausea  5. 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 Chronic headaches. Has a family hx of aneurysms per pt.   PLAN:   1. Resumed baclofen at prior  dosing---did not tolerate taper very well. Will need long term taper if ever needed in the future.    2. Will set up for botox 400 units to bilateral hamstrings again in 3 months   4. Topamax effective---continue at 25mg  qhs dose.  5. Continue dantrium to 100mg  TID over one week.   6. 15 minutes of face to face patient care time were spent during this visit. All questions were encouraged and answered. Will see the patient back in approximately 3 months' time for botox.

## 2016-10-27 NOTE — Telephone Encounter (Signed)
LM x 2

## 2016-10-27 NOTE — Telephone Encounter (Signed)
Called and spoke with pt and she is aware of results.  Nothing further is needed.  

## 2016-11-08 ENCOUNTER — Other Ambulatory Visit: Payer: Self-pay | Admitting: Physical Medicine & Rehabilitation

## 2016-11-08 DIAGNOSIS — R51 Headache: Principal | ICD-10-CM

## 2016-11-08 DIAGNOSIS — R519 Headache, unspecified: Secondary | ICD-10-CM

## 2016-12-12 ENCOUNTER — Other Ambulatory Visit: Payer: Self-pay | Admitting: Physical Medicine & Rehabilitation

## 2016-12-12 DIAGNOSIS — R519 Headache, unspecified: Secondary | ICD-10-CM

## 2016-12-12 DIAGNOSIS — R51 Headache: Principal | ICD-10-CM

## 2016-12-13 ENCOUNTER — Other Ambulatory Visit: Payer: Self-pay | Admitting: Physical Medicine & Rehabilitation

## 2016-12-13 DIAGNOSIS — R519 Headache, unspecified: Secondary | ICD-10-CM

## 2016-12-13 DIAGNOSIS — R51 Headache: Principal | ICD-10-CM

## 2017-01-02 ENCOUNTER — Other Ambulatory Visit: Payer: Self-pay | Admitting: Physical Medicine & Rehabilitation

## 2017-01-02 DIAGNOSIS — R519 Headache, unspecified: Secondary | ICD-10-CM

## 2017-01-02 DIAGNOSIS — R51 Headache: Secondary | ICD-10-CM

## 2017-01-02 DIAGNOSIS — G8250 Quadriplegia, unspecified: Secondary | ICD-10-CM

## 2017-01-11 ENCOUNTER — Encounter: Payer: Self-pay | Admitting: Obstetrics & Gynecology

## 2017-01-16 ENCOUNTER — Other Ambulatory Visit: Payer: Self-pay | Admitting: Physical Medicine & Rehabilitation

## 2017-01-16 DIAGNOSIS — R519 Headache, unspecified: Secondary | ICD-10-CM

## 2017-01-16 DIAGNOSIS — G8250 Quadriplegia, unspecified: Secondary | ICD-10-CM

## 2017-01-16 DIAGNOSIS — R51 Headache: Secondary | ICD-10-CM

## 2017-01-23 ENCOUNTER — Other Ambulatory Visit: Payer: Self-pay | Admitting: Physical Medicine & Rehabilitation

## 2017-01-23 ENCOUNTER — Encounter: Payer: Medicaid Other | Admitting: Physical Medicine & Rehabilitation

## 2017-02-10 ENCOUNTER — Other Ambulatory Visit: Payer: Self-pay | Admitting: Physical Medicine & Rehabilitation

## 2017-02-12 NOTE — Telephone Encounter (Signed)
Recieved electronic medication refill request for baclofen and dantrolene, last note stated:  1. Resumed baclofen at prior dosing---did not tolerate taper very well. Will need long term taper if ever needed in the future.    2. Continue dantrium to 100mg  TID over one week.    Unsure if I should refill these medication given avaliable information.   Please advise

## 2017-02-13 ENCOUNTER — Other Ambulatory Visit: Payer: Self-pay

## 2017-02-13 MED ORDER — DANTROLENE SODIUM 100 MG PO CAPS: ORAL_CAPSULE | ORAL | 2 refills | Status: DC

## 2017-02-13 MED ORDER — BACLOFEN 20 MG PO TABS: 40.0000 mg | ORAL_TABLET | Freq: Four times a day (QID) | ORAL | 4 refills | Status: DC

## 2017-02-21 ENCOUNTER — Encounter: Payer: Self-pay | Admitting: Obstetrics & Gynecology

## 2017-03-13 ENCOUNTER — Encounter: Payer: Self-pay | Admitting: Physical Medicine & Rehabilitation

## 2017-03-13 ENCOUNTER — Encounter: Payer: Medicaid Other | Attending: Physical Medicine & Rehabilitation | Admitting: Physical Medicine & Rehabilitation

## 2017-03-13 VITALS — BP 114/75 | HR 98 | Resp 14

## 2017-03-13 DIAGNOSIS — R51 Headache: Secondary | ICD-10-CM | POA: Diagnosis not present

## 2017-03-13 DIAGNOSIS — R519 Headache, unspecified: Secondary | ICD-10-CM

## 2017-03-13 DIAGNOSIS — G8250 Quadriplegia, unspecified: Secondary | ICD-10-CM | POA: Diagnosis not present

## 2017-03-13 MED ORDER — IMIPRAMINE HCL 50 MG PO TABS: ORAL_TABLET | ORAL | 5 refills | Status: DC

## 2017-03-13 NOTE — Patient Instructions (Signed)
PLEASE FEEL FREE TO CALL OUR OFFICE WITH ANY PROBLEMS OR QUESTIONS ([phone])      

## 2017-03-13 NOTE — Progress Notes (Signed)
Botox Injection for spasticity using needle EMG guidance Indication: Spastic tetraplegia (HCC)  Chronic daily headache - Plan: imipramine (TOFRANIL) 50 MG tablet  Bilateral LE's Dilution: 100 Units/ml        Total Units Injected: 400 Indication: Severe spasticity which interferes with ADL,mobility and/or  hygiene and is unresponsive to medication management and other conservative care Informed consent was obtained after describing risks and benefits of the procedure with the patient. This includes bleeding, bruising, infection, excessive weakness, or medication side effects. A REMS form is on file and signed.  Needle: 50mm injectable monopolar needle electrode  Number of units per muscle  Quadriceps 0 units Gastroc/soleus 0 units Hamstrings 200 units in each leg. 100 units semimembranosus/tendinosus and 100 units biceps femoris  -2 access points in each muscle Tibialis Posterior 0 units EHL 0 units All injections were done after obtaining appropriate EMG activity and after negative drawback for blood. The patient tolerated the procedure well. Post procedure instructions were given. Return in about 3 months (around 06/12/2017).

## 2017-03-26 ENCOUNTER — Telehealth: Payer: Self-pay | Admitting: Physical Medicine & Rehabilitation

## 2017-03-26 NOTE — Telephone Encounter (Signed)
Patient left voicemail for JN need medical records - no recipient will call for information

## 2017-04-09 ENCOUNTER — Other Ambulatory Visit: Payer: Self-pay | Admitting: Allergy and Immunology

## 2017-04-09 DIAGNOSIS — J3089 Other allergic rhinitis: Secondary | ICD-10-CM

## 2017-04-09 DIAGNOSIS — H1013 Acute atopic conjunctivitis, bilateral: Secondary | ICD-10-CM

## 2017-04-14 ENCOUNTER — Other Ambulatory Visit: Payer: Self-pay | Admitting: Allergy and Immunology

## 2017-04-14 DIAGNOSIS — J3089 Other allergic rhinitis: Secondary | ICD-10-CM

## 2017-04-26 ENCOUNTER — Telehealth: Payer: Self-pay | Admitting: Physical Medicine & Rehabilitation

## 2017-04-26 ENCOUNTER — Encounter: Payer: Self-pay | Admitting: Physical Medicine & Rehabilitation

## 2017-04-26 NOTE — Telephone Encounter (Signed)
Per grievance from patient agreed to dismiss patient with notice to another provider.  Patient has already requested her medical records- I phoned her home and left voicemail to find out where her records need to be mailed/or pick up.

## 2017-05-07 ENCOUNTER — Encounter: Payer: Self-pay | Admitting: Obstetrics & Gynecology

## 2017-05-07 ENCOUNTER — Ambulatory Visit (INDEPENDENT_AMBULATORY_CARE_PROVIDER_SITE_OTHER): Payer: Medicaid Other | Admitting: Obstetrics & Gynecology

## 2017-05-07 ENCOUNTER — Other Ambulatory Visit (HOSPITAL_COMMUNITY)
Admission: RE | Admit: 2017-05-07 | Discharge: 2017-05-07 | Disposition: A | Discharge status: Home / Self Care | Payer: Medicaid Other | Source: Ambulatory Visit | Attending: Obstetrics & Gynecology | Admitting: Obstetrics & Gynecology

## 2017-05-07 VITALS — BP 114/68 | HR 93

## 2017-05-07 DIAGNOSIS — Z01419 Encounter for gynecological examination (general) (routine) without abnormal findings: Secondary | ICD-10-CM

## 2017-05-07 DIAGNOSIS — Z0000 Encounter for general adult medical examination without abnormal findings: Secondary | ICD-10-CM | POA: Diagnosis not present

## 2017-05-07 NOTE — Progress Notes (Signed)
Subjective:    Veronica Huff is a 31 y.o. SW G0 female who presents for an annual exam. The patient has no complaints today. The patient is sexually active. GYN screening history: last pap: was normal. The patient wears seatbelts: yes. The patient participates in regular exercise: no. Has the patient ever been transfused or tattooed?: not asked. The patient reports that there is not domestic violence in her life.   Menstrual History: OB History    Gravida Para Term Preterm AB Living   0 0 0 0 0 0   SAB TAB Ectopic Multiple Live Births   0 0 0 0        Patient's last menstrual period was 03/26/2017 (approximate).    The following portions of the patient's history were reviewed and updated as appropriate: allergies, current medications, past family history, past medical history, past social history, past surgical history and problem list.  Review of Systems Pertinent items are noted in HPI.   polyamorous, wants STI testing [facility]- no breast/gyn/colon cancer Had a stroke at age 31, unknown etiology On depo provera, bleeds occasionally Objective:    BP 114/68   Pulse 93   LMP 03/26/2017 (Approximate)   General Appearance:    Alert, cooperative, no distress, appears stated age  Head:    Normocephalic, without obvious abnormality, atraumatic  Eyes:    PERRL, conjunctiva/corneas clear, EOM's intact, fundi    benign, both eyes  Ears:    Normal TM's and external ear canals, both ears  Nose:   Nares normal, septum midline, mucosa normal, no drainage    or sinus tenderness  Throat:   Lips, mucosa, and tongue normal; teeth and gums normal  Neck:   Supple, symmetrical, trachea midline, no adenopathy;    thyroid:  no enlargement/tenderness/nodules; no carotid   bruit or JVD  Back:     Symmetric, no curvature, ROM normal, no CVA tenderness  Lungs:     Clear to auscultation bilaterally, respirations unlabored  Chest Wall:    No tenderness or deformity   Heart:    Regular rate and rhythm, S1  and S2 normal, no murmur, rub   or gallop  Breast Exam:    No tenderness, masses, or nipple abnormality  Abdomen:     Soft, non-tender, bowel sounds active all four quadrants,    no masses, no organomegaly  Genitalia:    Normal female without lesion, discharge or tenderness, NSSA, NT, no adnexal masses     Extremities:   Extremities normal, atraumatic, no cyanosis or edema  Pulses:   2+ and symmetric all extremities  Skin:   Skin color, texture, turgor normal, no rashes or lesions  Lymph nodes:   Cervical, supraclavicular, and axillary nodes normal  Neurologic:   CNII-XII intact, normal strength, sensation and reflexes    throughout  .    Assessment:    Healthy female exam.    Plan:     Chlamydia specimen. GC specimen. Thin prep Pap smear.   STI testing

## 2017-05-08 ENCOUNTER — Telehealth: Payer: Self-pay | Admitting: *Deleted

## 2017-05-08 NOTE — Telephone Encounter (Signed)
Pa received for levocetirizine 5mg  done in nctracks approved faxed back to pharmacy

## 2017-05-09 ENCOUNTER — Telehealth: Payer: Self-pay | Admitting: Physical Medicine & Rehabilitation

## 2017-05-09 LAB — CYTOLOGY - PAP
CHLAMYDIA, DNA PROBE: NEGATIVE
Diagnosis: NEGATIVE
HPV (WINDOPATH): NOT DETECTED
NEISSERIA GONORRHEA: NEGATIVE

## 2017-05-09 NOTE — Telephone Encounter (Signed)
PATIENT CALLED AGAIN FOR MED RECDS TO [facility] - ADVISED SHE MADE ORIGINAL REQUEST TO HERSELF I MAILED HER HARD COPIES - SHE WANTS THEM SENT TO [facility] - I FAXED COPY TO [facility].

## 2017-05-09 NOTE — Telephone Encounter (Signed)
RECD VOICEMAIL TO SEND MED REC'DS TO DR RAMOS - LEFT VOICEMAIL AT HOME THAT AT HER REQ HAD SEND HARD COPIES TO HER HOME - DOCUMENT TOO LARGE TO FAX WOULD NEED TO TAKE TO DR Veronica Huff

## 2017-05-11 NOTE — Telephone Encounter (Signed)
Faxed med records to dr Ethelene Halramos also at patient rquest

## 2017-05-21 ENCOUNTER — Other Ambulatory Visit: Payer: Self-pay | Admitting: Physical Medicine & Rehabilitation

## 2017-05-22 ENCOUNTER — Other Ambulatory Visit: Payer: Self-pay | Admitting: Physical Medicine & Rehabilitation

## 2017-05-22 DIAGNOSIS — I639 Cerebral infarction, unspecified: Secondary | ICD-10-CM

## 2017-05-22 DIAGNOSIS — R51 Headache: Secondary | ICD-10-CM

## 2017-05-22 DIAGNOSIS — G8250 Quadriplegia, unspecified: Secondary | ICD-10-CM

## 2017-05-22 DIAGNOSIS — R519 Headache, unspecified: Secondary | ICD-10-CM

## 2017-05-24 ENCOUNTER — Telehealth: Payer: Self-pay | Admitting: Pulmonary Disease

## 2017-05-24 NOTE — Telephone Encounter (Signed)
Spoke with [employer]. She stated that Medicaid wants to cut back nursing hours for the patient, but the patient is still on a vent and has a trach.  She wants to know if VS can type a letter stating the patient really needs to keep her nursing hours at the current rate. Advised that VS was on vacation but would return to the office tomorrow. She verbalized understanding.   VS, are you willing to write a letter for the patient? Please advise. Thanks!

## 2017-05-25 ENCOUNTER — Encounter: Payer: Self-pay | Admitting: Pulmonary Disease

## 2017-05-25 NOTE — Telephone Encounter (Signed)
Called and spoke with Veronica Huff and she requested that the letter be mailed to the pt.  This has been done and nothing further is needed.

## 2017-05-25 NOTE — Telephone Encounter (Signed)
I have written letter

## 2017-05-25 NOTE — Telephone Encounter (Signed)
Letter signed and placed in outgoing mail. Nothing further needed.

## 2017-06-06 ENCOUNTER — Encounter: Payer: Medicaid Other | Attending: Physical Medicine & Rehabilitation | Admitting: Registered Nurse

## 2017-06-06 ENCOUNTER — Encounter: Payer: Self-pay | Admitting: Registered Nurse

## 2017-06-06 VITALS — BP 118/87 | HR 105

## 2017-06-06 DIAGNOSIS — R51 Headache: Secondary | ICD-10-CM | POA: Diagnosis not present

## 2017-06-06 DIAGNOSIS — G8250 Quadriplegia, unspecified: Secondary | ICD-10-CM | POA: Insufficient documentation

## 2017-06-06 DIAGNOSIS — R519 Headache, unspecified: Secondary | ICD-10-CM

## 2017-06-06 MED ORDER — DANTROLENE SODIUM 100 MG PO CAPS: ORAL_CAPSULE | ORAL | 4 refills | Status: DC

## 2017-06-06 MED ORDER — IMIPRAMINE HCL 50 MG PO TABS: ORAL_TABLET | ORAL | 4 refills | Status: DC

## 2017-06-06 MED ORDER — BACLOFEN 20 MG PO TABS: 40.0000 mg | ORAL_TABLET | Freq: Four times a day (QID) | ORAL | 4 refills | Status: DC

## 2017-06-06 MED ORDER — TOPIRAMATE 25 MG PO TABS: ORAL_TABLET | ORAL | 4 refills | Status: DC

## 2017-06-06 NOTE — Progress Notes (Signed)
Ms, Glennon Hamiltonnanda Drennen is a 31 year old female who returns for medical management, refills ordered. Ms. Willeen CassBennett states Dr. Ethelene Halamos denied the referral twice. She was instructed to follow up with her  PCP regarding  referral, she verbalizes understanding.  Personal Care Melvenia Beamurse Sharyl Nimrod( M[place]) in room, all questions answered.   Physical Exam:  Alert and Oriented X3 HEENT: Trach in place , using breath controlled joy stick. Chest: CTA Musculoskeletal System: Passive ROM, + 1 pitting edema to lower extremities Quadriplegic  Assessment and Plan:  1. 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 Continue Baclofen, Dantrium and Topamax. 2. 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     20 minutes of face to face patient care time was spent during this visit. All questions were encouraged and answered.

## 2017-06-12 ENCOUNTER — Ambulatory Visit: Payer: Self-pay | Admitting: Physical Medicine & Rehabilitation

## 2017-08-03 ENCOUNTER — Encounter (HOSPITAL_COMMUNITY): Payer: Self-pay

## 2017-08-03 DIAGNOSIS — Z79899 Other long term (current) drug therapy: Secondary | ICD-10-CM | POA: Insufficient documentation

## 2017-08-03 DIAGNOSIS — N3000 Acute cystitis without hematuria: Secondary | ICD-10-CM | POA: Insufficient documentation

## 2017-08-03 DIAGNOSIS — N3289 Other specified disorders of bladder: Secondary | ICD-10-CM | POA: Diagnosis present

## 2017-08-03 NOTE — ED Triage Notes (Signed)
Pt arrives via POV in wheelchair; pt c/o suspected UTI due to bladder spasms x 2 days; pt has hx of UTI; Pt a&ox 4; pt has a stoma and needs to be in and out cath through stoma for urine sample; pt c/o pain at 3/10 on arrival.- [facility]

## 2017-08-04 ENCOUNTER — Emergency Department (HOSPITAL_COMMUNITY)
Admission: EM | Admit: 2017-08-04 | Discharge: 2017-08-04 | Disposition: A | Discharge status: Home / Self Care | Payer: Medicaid Other | Attending: Emergency Medicine | Admitting: Emergency Medicine

## 2017-08-04 DIAGNOSIS — N3000 Acute cystitis without hematuria: Secondary | ICD-10-CM

## 2017-08-04 LAB — URINALYSIS, ROUTINE W REFLEX MICROSCOPIC
BILIRUBIN URINE: NEGATIVE
Glucose, UA: NEGATIVE mg/dL
Ketones, ur: NEGATIVE mg/dL
Nitrite: POSITIVE — AB
Protein, ur: NEGATIVE mg/dL
SQUAMOUS EPITHELIAL / LPF: NONE SEEN
Specific Gravity, Urine: 1.008 (ref 1.005–1.030)
pH: 7 (ref 5.0–8.0)

## 2017-08-04 LAB — POC URINE PREG, ED: PREG TEST UR: NEGATIVE

## 2017-08-04 MED ORDER — NITROFURANTOIN MONOHYD MACRO 100 MG PO CAPS: 100.0000 mg | ORAL_CAPSULE | Freq: Once | ORAL | Status: DC

## 2017-08-04 MED ORDER — HYDROCODONE-ACETAMINOPHEN 5-325 MG PO TABS: 1.0000 | ORAL_TABLET | Freq: Four times a day (QID) | ORAL | 0 refills | Status: DC | PRN

## 2017-08-04 MED ORDER — HYDROCODONE-ACETAMINOPHEN 5-325 MG PO TABS
2.0000 | ORAL_TABLET | Freq: Once | ORAL | Status: AC
  Administered 2017-08-04: 2 via ORAL
  Filled 2017-08-04: qty 2

## 2017-08-04 MED ORDER — CEPHALEXIN 250 MG PO CAPS
500.0000 mg | ORAL_CAPSULE | Freq: Once | ORAL | Status: AC
  Administered 2017-08-04: 500 mg via ORAL
  Filled 2017-08-04: qty 2

## 2017-08-04 MED ORDER — CEPHALEXIN 500 MG PO CAPS: 500.0000 mg | ORAL_CAPSULE | Freq: Three times a day (TID) | ORAL | 0 refills | Status: DC
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
## 2017-08-06 LAB — URINE CULTURE: Culture: >=100000 — AB

## 2017-08-07 ENCOUNTER — Telehealth: Payer: Self-pay | Admitting: Emergency Medicine

## 2017-08-07 NOTE — Telephone Encounter (Signed)
Post ED Visit - Positive Culture Follow-up  Culture report reviewed by antimicrobial stewardship pharmacist:   Enzo Bi, Pharm.D.  Celedonio Miyamoto, Pharm.D., BCPS AQ-ID  Garvin Fila, Pharm.D., BCPS  Georgina Pillion, Pharm.D., BCPS  [place], [address].D., BCPS, AAHIVP  Estella Husk, Pharm.D., BCPS, AAHIVP  Lysle Pearl, PharmD, BCPS  Casilda Carls, PharmD, BCPS  Pollyann Samples, PharmD, BCPS  Positive urine culture Treated with cephalexin, organism sensitive to the same and no further patient follow-up is required at this time.  Berle Mull 08/07/2017, 12:25 PM

## 2017-08-29 ENCOUNTER — Encounter: Payer: Self-pay | Admitting: Diagnostic Neuroimaging

## 2017-08-29 ENCOUNTER — Ambulatory Visit (INDEPENDENT_AMBULATORY_CARE_PROVIDER_SITE_OTHER): Payer: Medicaid Other | Admitting: Diagnostic Neuroimaging

## 2017-08-29 VITALS — BP 114/79 | HR 95

## 2017-08-29 DIAGNOSIS — R519 Headache, unspecified: Secondary | ICD-10-CM

## 2017-08-29 DIAGNOSIS — G8250 Quadriplegia, unspecified: Secondary | ICD-10-CM

## 2017-08-29 DIAGNOSIS — R51 Headache: Secondary | ICD-10-CM

## 2017-08-29 DIAGNOSIS — I639 Cerebral infarction, unspecified: Secondary | ICD-10-CM | POA: Diagnosis not present

## 2017-08-29 MED ORDER — DANTROLENE SODIUM 100 MG PO CAPS: 100.0000 mg | ORAL_CAPSULE | Freq: Three times a day (TID) | ORAL | 12 refills | Status: DC

## 2017-08-29 MED ORDER — TOPIRAMATE 50 MG PO TABS: 50.0000 mg | ORAL_TABLET | Freq: Every day | ORAL | 12 refills | Status: DC

## 2017-08-29 MED ORDER — BACLOFEN 20 MG PO TABS: 40.0000 mg | ORAL_TABLET | Freq: Four times a day (QID) | ORAL | 12 refills | Status: DC

## 2017-08-29 NOTE — Progress Notes (Signed)
[facility]  PATIENT: Veronica Huff DOB: [birth year]  REFERRING CLINICIAN: Concepcion Elk, E HISTORY FROM: patient and nurse Sharyl Nimrod) REASON FOR VISIT: follow up   HISTORICAL  CHIEF COMPLAINT:  Chief Complaint  Patient presents with  . Cramps and spasms    rm 7, HH RN Sharyl Nimrod, "wants to ask about Botox for leg spasms, was receiving from Dr Riley Kill""    HISTORY OF PRESENT ILLNESS:   UPDATE (08/29/17, VRP): Since last visit, was doing well on botox, dantrolene and baclofen for spasticity. Also was on topiramate for headaches. Tolerating meds. No alleviating or aggravating factors. Then has some issue with PM&R practice, and requested referral to another provider for botox and spasticity treatment.  PRIOR HPI (04/14/15): 31 year old ambidextrous female with history of brainstem stroke at age 39 years old, with resultant spastic quadriplegia, neurogenic bowel, here for evaluation of spasticity and constipation issues. Patient had been managed by physiatry as stated in [place] previously, but now managed by PCP for past 3 years. Patient had requested referral to PM&R/physiatry in [place], but for some reason PCP made referral to our practice instead. In summary at age 33 years old patient suffered catastrophic brainstem stroke (cryptogenic), requiring intubation, ventilator support, ICU care, with extensive rehabilitation. With expert care and great effort, patient was able to recover ability to communicate as well as breathe without ventilator support. She required a number of surgeries over the years related to complications from this stroke. Her current issues mainly consist of muscle spasms in the legs especially in the left leg, which was previously treated with Botox and dantrolene. She also has difficulty managing her bowel function, struggling with constipation.    REVIEW OF SYSTEMS: Full 14 system review of systems performed and negative except: dizziness  headache chills nausea.  ALLERGIES: Allergies  Allergen Reactions  . Bee Venom Shortness Of Breath and Swelling    SHOCK SHOCK  . Amoxicillin Hives and Itching  . Augmentin [Amoxicillin-Pot Clavulanate] Hives and Itching  . Ciprofloxacin Hives and Rash  . Tobramycin Hives and Rash  . Erythromycin Other (See Comments)    UNSPECIFIED REACTION  UNSPECIFIED REACTION   . Trazodone Other (See Comments)    UNSPECIFIED REACTION  Eyes shake  . Trazodone And Nefazodone     UNSPECIFIED REACTION   . Morphine And Related Other (See Comments)    "GOES CRAZY" "GOES CRAZY"    HOME MEDICATIONS: Outpatient Medications Prior to Visit  Medication Sig Dispense Refill  . acetaminophen (TYLENOL) 500 MG tablet Take 500 mg by mouth every 6 (six) hours as needed (pain).    Marland Kitchen aspirin-acetaminophen-caffeine (EXCEDRIN MIGRAINE) 250-250-65 MG tablet Take by mouth every 6 (six) hours as needed for headache.    . baclofen (LIORESAL) 20 MG tablet Take 2 tablets (40 mg total) by mouth 4 (four) times daily. 240 each 4  . bisacodyl (DULCOLAX) 10 MG suppository Place 10 mg rectally every other day as needed. constipation    . bismuth subsalicylate (PEPTO BISMOL) 262 MG/15ML suspension Take 30 mLs by mouth every 6 (six) hours as needed.    . dantrolene (DANTRIUM) 100 MG capsule TAKE 1 CAPSULE(100 MG) BY MOUTH THREE TIMES DAILY (Patient taking differently: Take 100 mg by mouth 3 (three) times daily. ) 90 capsule 4  . Dextromethorphan-Guaifenesin 5-100 MG/5ML LIQD Take 5 mLs by mouth 2 (two) times daily as needed.    . diphenhydrAMINE (BENADRYL) 25 mg capsule Take 50 mg by mouth every 4 (four) hours as needed for allergies.    Marland Kitchen  EPIPEN 2-PAK 0.3 MG/0.3ML SOAJ injection USE UTD FOR ALLERGIC REACTION  1  . fluticasone (FLONASE) 50 MCG/ACT nasal spray INSERT 2 SPRAYS INTO EACH NOSTRIL DAILY FOR STUFFY NOSE OR DRAINAGE 16 g 5  . guaiFENesin (MUCINEX) 600 MG 12 hr tablet Take 600 mg by mouth 2 (two) times daily as needed  (1-2 tab BID PRN).    Marland Kitchen HYDROcodone-acetaminophen (NORCO/VICODIN) 5-325 MG tablet Take 1-2 tablets by mouth every 6 (six) hours as needed. 10 tablet 0  . hydrocortisone cream 0.5 % Apply 1 application topically 2 (two) times daily. To hairline and brows    . ibuprofen (ADVIL,MOTRIN) 200 MG tablet Take 400 mg by mouth every 6 (six) hours as needed.    Marland Kitchen imipramine (TOFRANIL) 50 MG tablet TAKE 2 TABLETS(100 MG) BY MOUTH AT BEDTIME (Patient taking differently: Take 100-150 mg by mouth at bedtime. ) 60 tablet 4  . ketoconazole (NIZORAL) 2 % shampoo Apply 1 application topically 2 (two) times a week.    . levocetirizine (XYZAL) 5 MG tablet TAKE 1 TABLET BY MOUTH DAILY AS NEEDED (Patient taking differently: TAKE 1 TABLET BY MOUTH at bedtime) 30 tablet 5  . loperamide (IMODIUM A-D) 2 MG tablet Take 4-8 mg by mouth daily as needed for diarrhea or loose stools (not to exceed 4 tablets in 24 hours).    . medroxyPROGESTERone (DEPO-PROVERA) 150 MG/ML injection INJ 1 ML IM Q 13 WEEKS  3  . Methen-Hyosc-Meth Blue-Na Phos (UROGESIC-BLUE) 81.6 MG TABS Take 1 tablet by mouth 4 (four) times daily as needed (pain).     . Multiple Vitamin (MULTIVITAMIN) tablet Take 1 tablet by mouth daily.    . mupirocin ointment (BACTROBAN) 2 % 1 application by Tracheal Tube route daily. Applies to trach-stoma site    . neomycin-polymyxin B (NEOSPORIN GU IRRIGANT) SOLN 30-40 mLs by GU irrigant route at bedtime. Instilled thru I&O catheter at bedtime-to prevent uti    . nitrofurantoin (MACRODANTIN) 50 MG capsule Take 50 mg by mouth at bedtime.    Marland Kitchen nystatin (MYCOSTATIN) powder Apply topically 2 (two) times daily as needed. Small amount    . olopatadine (PATANOL) 0.1 % ophthalmic solution INSTILL 1 DROP INTO BOTH EYES TWICE DAILY 5 mL 5  . phenazopyridine (PYRIDIUM) 95 MG tablet Take 95 mg by mouth 3 (three) times daily as needed for pain.    . polyethylene glycol (MIRALAX / GLYCOLAX) packet Take 17 g by mouth daily.    . promethazine  (PHENERGAN) 25 MG suppository Place 25 mg rectally every 6 (six) hours as needed for nausea or vomiting.    . pseudoephedrine (SUDAFED) 120 MG 12 hr tablet Take 120 mg by mouth 2 (two) times daily as needed for congestion.    . sertraline (ZOLOFT) 100 MG tablet Take 100 mg by mouth at bedtime.    . solifenacin (VESICARE) 10 MG tablet Take 10 mg by mouth daily. Every am    . sucralfate (CARAFATE) 1 GM/10ML suspension Take 1 g by mouth 3 (three) times daily.     Marland Kitchen topiramate (TOPAMAX) 25 MG tablet TAKE 1 TO 2 TABLETS(25 TO 50 MG) BY MOUTH AT BEDTIME 60 tablet 4  . cephALEXin (KEFLEX) 500 MG capsule Take 1 capsule (500 mg total) by mouth 3 (three) times daily. 21 capsule 0   No facility-administered medications prior to visit.     PAST MEDICAL HISTORY: Past Medical History:  Diagnosis Date  . Anxiety   . CVA (cerebral infarction)    Ischemic brainstem stroke  in 2011 at age 64 due to unknown reasons--quadriplegic after stroke.  . Depression    Zoloft has helped-not having any current depression  . Edema extremities    Feet and legs-mostly in feet  . Headache(784.0)    Prior to imipramine-none currently  . Pneumonia   . Quadriplegia (HCC)    Secondary to stroke at age 33-has trach-lives with boyfriend-motorized w/c-joystick to chin to drive w/c--I & O cath thru suprapubic stoma - has nursing care 16 hrs daily and boyfriend able to help pt the other hours of day.  . Recurrent UTI (urinary tract infection)    Due to suprapubic stoma, repetitive catheterizations  . Sleep apnea    On ventilator at night -has oxygen but rarely needs unless she has a cold and sats drop  . Stroke [facility])     PAST SURGICAL HISTORY: Past Surgical History:  Procedure Laterality Date  . BLADDER SURGERY  2009,2012  . BRONCHOSCOPY  2006  . CHOLECYSTECTOMY  11/18/2012   Procedure: LAPAROSCOPIC CHOLECYSTECTOMY;  Surgeon: Axel Filler, MD;  Location: [facility];  Service: General;  Laterality: N/A;  .  ESOPHAGOGASTRODUODENOSCOPY    . GASTROSTOMY TUBE PLACEMENT  2000  . ILEOSTOMY  2006  . MICROLARYNGOSCOPY WITH LASER AND BALLOON DILATION N/A 09/27/2016   Procedure: MICROLARYNGOSCOPY WITH LASER AND BALLOON DILATION;  Surgeon: Christia Reading, MD;  Location: [facility];  Service: ENT;  Laterality: N/A;  micro direct laryngoscopy with co2 laser balloon dilation of trachea  . REMOVAL OF GASTROSTOMY TUBE    . SPINE SURGERY  2004,2011  . TENDON REPAIR  2012   Left wrist  . THROAT SURGERY  2004  . TRACHEOSTOMY  2000  . wirst surgery Left   . WISDOM TOOTH EXTRACTION  2008    FAMILY HISTORY: Family History  Problem Relation Age of Onset  . Diabetes Mother   . Hypertension Mother   . Varicose Veins Mother   . Cancer Maternal Aunt        lung  . Diabetes Maternal Grandfather   . Heart disease Maternal Grandfather   . Heart disease Paternal Grandfather   . Other Sister        overdose    SOCIAL HISTORY:  Social History   Social History  . Marital status: Single    Spouse name: N/A  . Number of children: N/A  . Years of education: college    Occupational History  . disabled    Social History Main Topics  . Smoking status: Never Smoker  . Smokeless tobacco: Never Used  . Alcohol use No  . Drug use: No  . Sexual activity: Yes    Birth control/ protection: None   Other Topics Concern  . Not on file   Social History Narrative   08/29/17 Lives at home with boyfriend, Has a RN Sharyl Nimrod that helps to care for her    Drinks no caffeine      PHYSICAL EXAM  Vitals:   08/29/17 1051  BP: 114/79  Pulse: 95    There is no height or weight on file to calculate BMI.  No exam data present  No flowsheet data found.  GENERAL EXAM: Patient is in no distress; well developed, nourished and groomed; neck is supple  CARDIOVASCULAR: Regular rate and rhythm, no murmurs, no carotid bruits  NEUROLOGIC: MENTAL STATUS: awake, alert, oriented to person, place and time, recent and remote  memory intact, normal attention and concentration, language fluent, comprehension intact, naming intact, fund of knowledge appropriate  CRANIAL NERVE: no papilledema on fundoscopic exam, pupils equal and reactive to light, visual fields full to confrontation, extraocular muscles --> SUBJECTIVE DOUBLE VISION IN ALL GAZE DIRECTIONS; DECR VERTICAL AND HORIZONTAL GAZE, ESP LEFT AND UPWARD. POSITIVE NYSTAGMUS IN ALL DIRECTIONS. facial sensation and strength symmetric, hearing intact, palate elevates symmetrically, uvula midline, shoulder shrug symmetric, tongue midline. MOTOR: ATROPHY AND WEAKNESS IN BILATERAL UPPER EXT; BILATERAL CONTRACTURES AT BICEPS. BUE 2-3 PROXIMAL, 0 DISTAL. BLE INCREASED TONE, 0/5 STRENGTH. SUSTAINED CLONUS IN ANKLES. SENSORY: normal and symmetric to light touch, temperature, vibration COORDINATION: LIMITED DUE TO WEAKNESS REFLEXES: BRISK AT BICEPS AND KNEES GAIT/STATION: IN POWER CHAIR WITH MOUTH CONTROL    DIAGNOSTIC DATA (LABS, IMAGING, TESTING) - I reviewed patient records, labs, notes, testing and imaging myself where available.  Lab Results  Component Value Date   WBC 5.0 09/26/2016   HGB 15.1 (H) 09/26/2016   HCT 44.6 09/26/2016   MCV 86.6 09/26/2016   PLT 131 (L) 09/26/2016      Component Value Date/Time   NA 138 09/26/2016 1322   K 4.0 09/26/2016 1322   CL 105 09/26/2016 1322   CO2 24 09/26/2016 1322   GLUCOSE 76 09/26/2016 1322   BUN 8 09/26/2016 1322   CREATININE 0.39 (L) 09/26/2016 1322   CALCIUM 9.5 09/26/2016 1322   PROT 6.9 07/05/2013 0530   ALBUMIN 3.5 07/05/2013 0530   AST 17 07/05/2013 0530   ALT 14 07/05/2013 0530   ALKPHOS 78 07/05/2013 0530   BILITOT 0.3 07/05/2013 0530   GFRNONAA >60 09/26/2016 1322   GFRAA >60 09/26/2016 1322   No results found for: CHOL, HDL, LDLCALC, LDLDIRECT, TRIG, CHOLHDL No results found for: LKGM0N No results found for: VITAMINB12 No results found for: TSH   11/27/98 CT head - normal study (report  only)    ASSESSMENT AND PLAN  31 y.o. year old female here with cryptogenic brainstem stroke at age 71 years old, resultant spastic quadriplegia and neurogenic bowel.  Was doing well on botox, dantrolene, baclofen for spasticity (to help with ADLs and ROM). Here for transition of care.     PLAN:  - will request transfer to Dr. Terrace Arabia for botox and spasticity treatments; I have discussed her case with Dr. Terrace Arabia who agrees to accept this patient for botox and spasticity treatment - continue botox, dantrolene, baclofen - increase topiramate to  at bedtime for headaches (tension vs migraine variant)  Meds ordered this encounter  Medications  . topiramate (TOPAMAX) 50 MG tablet    Sig: Take 1 tablet (50 mg total) by mouth at bedtime. TAKE 1 TO 2 TABLETS(25 TO 50 MG) BY MOUTH AT BEDTIME    Dispense:  30 tablet    Refill:  12  . dantrolene (DANTRIUM) 100 MG capsule    Sig: Take 1 capsule (100 mg total) by mouth 3 (three) times daily. TAKE 1 CAPSULE(100 MG) BY MOUTH THREE TIMES DAILY    Dispense:  90 capsule    Refill:  12  . baclofen (LIORESAL) 20 MG tablet    Sig: Take 2 tablets (40 mg total) by mouth 4 (four) times daily.    Dispense:  240 each    Refill:  12   Return for with Dr. Terrace Arabia (1-2 months). for botox and consult    Suanne Marker, MD 08/29/2017, 11:54 AM Certified in Neurology, Neurophysiology and Neuroimaging  [facility] [address] [place], [place] [address] [phone]

## 2017-09-03 ENCOUNTER — Telehealth: Payer: Self-pay | Admitting: *Deleted

## 2017-09-03 NOTE — Telephone Encounter (Signed)
Left message requesting patient call back for an appt.  She needs to be scheduled with Dr. Terrace Arabia for a consultation appt to discuss possible Botox treatment.  Please use the next available time slot when she returns the call.  If this pushes the appt too far out, please send me a message for work-in.

## 2017-09-03 NOTE — Telephone Encounter (Signed)
-----   Message from Suanne Marker, MD sent at 08/30/2017 12:21 PM EDT ----- Dr. Terrace Arabia,  Here is the patient we discussed about transferring to you for botox.   Thanks, VRP

## 2017-09-03 NOTE — Telephone Encounter (Signed)
Spoke to patient - offered appts on 10/17, 10/22, 10/31, 11/7 and 11/14.  She was unavailable on the times open on those days.  She accepted an appt on 10/24/17 at 1pm.  She is aware to check in at 12:30am.

## 2017-09-05 NOTE — Telephone Encounter (Signed)
Pt has called back to inform that she is greatfull for RN Marcelino DusterMichelle calling offering an appointment for today but she needs at least 48 hours notice for transportation.  Pt has declined the scheduled opportunity for today.  She has not requested a call back

## 2017-09-11 ENCOUNTER — Ambulatory Visit: Payer: Medicaid Other | Admitting: Allergy and Immunology

## 2017-09-19 ENCOUNTER — Telehealth: Payer: Self-pay | Admitting: Neurology

## 2017-09-19 NOTE — Telephone Encounter (Signed)
Spoke to Dr. Yan, she is going to go ahead and inject Botox at her appt on 10/24/17 rather than see her for consultation.  It is very difficulty for patient to get to our office for appts.  Her consent form is on file. 

## 2017-09-19 NOTE — Telephone Encounter (Signed)
This patient has an apt scheduled on 12-5. Is this supposed to be an injection?

## 2017-09-19 NOTE — Telephone Encounter (Signed)
Spoke to Dr. Terrace ArabiaYan, she is going to go ahead and inject Botox at her appt on 10/24/17 rather than see her for consultation.  It is very difficulty for patient to get to our office for appts.  Her consent form is on file.

## 2017-09-20 NOTE — Telephone Encounter (Signed)
Noted, thank you

## 2017-09-21 ENCOUNTER — Other Ambulatory Visit: Payer: Self-pay | Admitting: Allergy and Immunology

## 2017-09-21 DIAGNOSIS — J3089 Other allergic rhinitis: Secondary | ICD-10-CM

## 2017-09-21 DIAGNOSIS — H1013 Acute atopic conjunctivitis, bilateral: Secondary | ICD-10-CM

## 2017-09-26 ENCOUNTER — Emergency Department (HOSPITAL_COMMUNITY): Payer: Medicaid Other

## 2017-09-26 ENCOUNTER — Other Ambulatory Visit: Payer: Self-pay

## 2017-09-26 ENCOUNTER — Emergency Department (HOSPITAL_COMMUNITY)
Admission: EM | Admit: 2017-09-26 | Discharge: 2017-09-26 | Disposition: A | Discharge status: Home / Self Care | Payer: Medicaid Other | Attending: Emergency Medicine | Admitting: Emergency Medicine

## 2017-09-26 ENCOUNTER — Encounter (HOSPITAL_COMMUNITY): Payer: Self-pay

## 2017-09-26 DIAGNOSIS — N390 Urinary tract infection, site not specified: Secondary | ICD-10-CM | POA: Diagnosis not present

## 2017-09-26 DIAGNOSIS — I10 Essential (primary) hypertension: Secondary | ICD-10-CM | POA: Insufficient documentation

## 2017-09-26 DIAGNOSIS — R109 Unspecified abdominal pain: Secondary | ICD-10-CM

## 2017-09-26 DIAGNOSIS — R05 Cough: Secondary | ICD-10-CM | POA: Diagnosis not present

## 2017-09-26 DIAGNOSIS — Z79899 Other long term (current) drug therapy: Secondary | ICD-10-CM | POA: Diagnosis not present

## 2017-09-26 DIAGNOSIS — R1030 Lower abdominal pain, unspecified: Secondary | ICD-10-CM | POA: Diagnosis present

## 2017-09-26 LAB — COMPREHENSIVE METABOLIC PANEL
ALT: 31 U/L (ref 14–54)
AST: 34 U/L (ref 15–41)
Albumin: 4 g/dL (ref 3.5–5.0)
Alkaline Phosphatase: 79 U/L (ref 38–126)
Anion gap: 8 (ref 5–15)
BUN: 10 mg/dL (ref 6–20)
CO2: 26 mmol/L (ref 22–32)
Calcium: 9.3 mg/dL (ref 8.9–10.3)
Chloride: 105 mmol/L (ref 101–111)
Creatinine, Ser: <0.3 mg/dL — ABNORMAL LOW (ref 0.44–1.00)
Glucose, Bld: 103 mg/dL — ABNORMAL HIGH (ref 65–99)
Potassium: 3.8 mmol/L (ref 3.5–5.1)
Sodium: 139 mmol/L (ref 135–145)
Total Bilirubin: 0.8 mg/dL (ref 0.3–1.2)
Total Protein: 8 g/dL (ref 6.5–8.1)

## 2017-09-26 LAB — URINALYSIS, ROUTINE W REFLEX MICROSCOPIC
Bilirubin Urine: NEGATIVE
Glucose, UA: NEGATIVE mg/dL
Ketones, ur: NEGATIVE mg/dL
Nitrite: NEGATIVE
Protein, ur: NEGATIVE mg/dL
Specific Gravity, Urine: 1.006 (ref 1.005–1.030)
Squamous Epithelial / LPF: NONE SEEN
pH: 7 (ref 5.0–8.0)

## 2017-09-26 LAB — CBC
HCT: 43.6 % (ref 36.0–46.0)
Hemoglobin: 15.1 g/dL — ABNORMAL HIGH (ref 12.0–15.0)
MCH: 29.7 pg (ref 26.0–34.0)
MCHC: 34.6 g/dL (ref 30.0–36.0)
MCV: 85.8 fL (ref 78.0–100.0)
Platelets: 162 10*3/uL (ref 150–400)
RBC: 5.08 MIL/uL (ref 3.87–5.11)
RDW: 14.1 % (ref 11.5–15.5)
WBC: 4.1 10*3/uL (ref 4.0–10.5)

## 2017-09-26 LAB — LIPASE, BLOOD: Lipase: 43 U/L (ref 11–51)

## 2017-09-26 MED ORDER — HYDROMORPHONE HCL 1 MG/ML IJ SOLN
1.0000 mg | Freq: Once | INTRAMUSCULAR | Status: AC
  Administered 2017-09-26: 1 mg via INTRAVENOUS
  Filled 2017-09-26: qty 1

## 2017-09-26 MED ORDER — SULFAMETHOXAZOLE-TRIMETHOPRIM 800-160 MG PO TABS: 1.0000 | ORAL_TABLET | Freq: Two times a day (BID) | ORAL | 0 refills | Status: AC

## 2017-09-26 MED ORDER — HYDROCODONE-ACETAMINOPHEN 5-325 MG PO TABS: 1.0000 | ORAL_TABLET | ORAL | 0 refills | Status: DC | PRN

## 2017-09-26 MED ORDER — HYDROMORPHONE HCL 1 MG/ML IJ SOLN
0.6000 mg | Freq: Once | INTRAMUSCULAR | Status: AC
  Administered 2017-09-26: 0.6 mg via INTRAVENOUS
  Filled 2017-09-26: qty 1

## 2017-09-26 MED ORDER — SULFAMETHOXAZOLE-TRIMETHOPRIM 800-160 MG PO TABS
1.0000 | ORAL_TABLET | Freq: Once | ORAL | Status: AC
  Administered 2017-09-26: 1 via ORAL
  Filled 2017-09-26: qty 1

## 2017-09-26 MED ORDER — IOPAMIDOL (ISOVUE-300) INJECTION 61%
INTRAVENOUS | Status: AC
  Administered 2017-09-26: 100 mL via INTRAVENOUS
  Filled 2017-09-26: qty 100

## 2017-09-26 MED ORDER — IOPAMIDOL (ISOVUE-300) INJECTION 61%
100.0000 mL | Freq: Once | INTRAVENOUS | Status: AC | PRN
  Administered 2017-09-26: 100 mL via INTRAVENOUS

## 2017-09-26 MED ORDER — ONDANSETRON HCL 4 MG PO TABS: 4.0000 mg | ORAL_TABLET | Freq: Four times a day (QID) | ORAL | 0 refills | Status: DC

## 2017-09-26 MED ORDER — ONDANSETRON HCL 4 MG/2ML IJ SOLN
4.0000 mg | Freq: Once | INTRAMUSCULAR | Status: AC
  Administered 2017-09-26: 4 mg via INTRAVENOUS
  Filled 2017-09-26: qty 2

## 2017-09-26 MED ORDER — SODIUM CHLORIDE 0.9 % IV SOLN
INTRAVENOUS | Status: DC
  Administered 2017-09-26: 16:00:00 via INTRAVENOUS

## 2017-09-26 NOTE — ED Triage Notes (Signed)
Per [facility]- Pt resides at home. Care for by boyfriend and home health RN. Generalized stomach pain and nausea x 1 day. Denies UTI symptoms. In and out cath normal appearance. Denies fever and diarrhea.

## 2017-09-26 NOTE — ED Provider Notes (Signed)
[facility]-EMERGENCY DEPT Provider Note   CSN: [id number] Arrival date & time: 09/26/17  1422     History   Chief Complaint Chief Complaint  Patient presents with  . Abdominal Pain  . Nausea    HPI Veronica Huff is a 31 y.o. female.  HPI   31 year old female with lower abdominal pain.  Onset about a day ago.  Associated with nausea.  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  She self caths through a stoma.  She reports no change in urine appearance.  No fever.  No diarrhea.  Past Medical History:  Diagnosis Date  . Anxiety   . CVA (cerebral infarction)    Ischemic brainstem stroke in 2011 at age 58 due to unknown reasons--quadriplegic after stroke.  . Depression    Zoloft has helped-not having any current depression  . Edema extremities    Feet and legs-mostly in feet  . Headache(784.0)    Prior to imipramine-none currently  . Pneumonia   . Quadriplegia (HCC)    Secondary to stroke at age 89-has trach-lives with boyfriend-motorized w/c-joystick to chin to drive w/c--I & O cath thru suprapubic stoma - has nursing care 16 hrs daily and boyfriend able to help pt the other hours of day.  . Recurrent UTI (urinary tract infection)    Due to suprapubic stoma, repetitive catheterizations  . Sleep apnea    On ventilator at night -has oxygen but rarely needs unless she has a cold and sats drop  . Stroke [facility])     Patient Active Problem List   Diagnosis Date Noted  . Allergic rhinitis 09/12/2016  . Allergic conjunctivitis 09/12/2016  . Chronic respiratory failure (HCC) 01/17/2016  . Spastic tetraplegia (HCC) 09/29/2015  . Chronic daily headache 09/29/2015  . Brain stem infarction 07/30/2015  . Anger reaction 07/30/2015  . Generalized anxiety disorder 07/30/2015  . Venous (peripheral) insufficiency 12/24/2014  . Quadriplegia (HCC)   . CVA (cerebral infarction)     Past Surgical History:  Procedure Laterality Date  . BLADDER  SURGERY  2009,2012  . BRONCHOSCOPY  2006  . ESOPHAGOGASTRODUODENOSCOPY    . GASTROSTOMY TUBE PLACEMENT  2000  . ILEOSTOMY  2006  . REMOVAL OF GASTROSTOMY TUBE    . SPINE SURGERY  2004,2011  . TENDON REPAIR  2012   Left wrist  . THROAT SURGERY  2004  . TRACHEOSTOMY  2000  . wirst surgery Left   . WISDOM TOOTH EXTRACTION  2008    OB History    Gravida Para Term Preterm AB Living   0 0 0 0 0 0   SAB TAB Ectopic Multiple Live Births   0 0 0 0         Home Medications    Prior to Admission medications   Medication Sig Start Date End Date Taking? Authorizing Provider  baclofen (LIORESAL) 20 MG tablet Take 2 tablets (40 mg total) by mouth 4 (four) times daily. 08/29/17  Yes Penumalli, Glenford Bayley, MD  bisacodyl (DULCOLAX) 10 MG suppository Place 10 mg rectally every other day as needed. constipation   Yes [provider]  bismuth subsalicylate (PEPTO BISMOL) 262 MG/15ML suspension Take 30 mLs by mouth every 6 (six) hours as needed.   Yes [provider]  dantrolene (DANTRIUM) 100 MG capsule Take 1 capsule (100 mg total) by mouth 3 (three) times daily. TAKE 1 CAPSULE(100 MG) BY MOUTH THREE TIMES DAILY 08/29/17  Yes Penumalli, Glenford Bayley, MD  Dextromethorphan HBr (  VICKS DAYQUIL COUGH) 15 MG/15ML LIQD Take 30 mLs every 6 (six) hours as needed by mouth (cough).   Yes [provider]  EPIPEN 2-PAK 0.3 MG/0.3ML SOAJ injection USE UTD FOR ALLERGIC REACTION 05/10/15  Yes [provider]  fluticasone (FLONASE) 50 MCG/ACT nasal spray INSERT 2 SPRAYS INTO EACH NOSTRIL DAILY FOR STUFFY NOSE OR DRAINAGE 04/17/17  Yes Bobbitt, Heywood Ilesalph Carter, MD  guaiFENesin (MUCINEX) 600 MG 12 hr tablet Take 600 mg by mouth 2 (two) times daily as needed (1-2 tab BID PRN).   Yes [provider]  hydrocortisone cream 0.5 % Apply 1 application topically 2 (two) times daily. To hairline and brows   Yes [provider]  imipramine (TOFRANIL) 50 MG tablet TAKE 2 TABLETS(100 MG)  BY MOUTH AT BEDTIME Patient taking differently: Take 100-150 mg by mouth at bedtime.  06/06/17  Yes Jones Baleshomas, Eunice L, NP  ketoconazole (NIZORAL) 2 % shampoo Apply 1 application topically 2 (two) times a week.   Yes [provider]  levocetirizine (XYZAL) 5 MG tablet TAKE 1 TABLET BY MOUTH DAILY AS NEEDED Patient taking differently: TAKE 1 TABLET BY MOUTH DAILY 09/21/17  Yes Bobbitt, Heywood Ilesalph Carter, MD  loperamide (IMODIUM A-D) 2 MG tablet Take 4-8 mg by mouth daily as needed for diarrhea or loose stools (not to exceed 4 tablets in 24 hours).   Yes [provider]  medroxyPROGESTERone (DEPO-PROVERA) 150 MG/ML injection INJ 1 ML IM Q 13 WEEKS 07/09/15  Yes [provider]  Methen-Hyosc-Meth Blue-Na Phos (UROGESIC-BLUE) 81.6 MG TABS Take 1 tablet by mouth 4 (four) times daily as needed (pain).    Yes [provider]  Multiple Vitamin (MULTIVITAMIN) tablet Take 1 tablet by mouth daily.   Yes [provider]  mupirocin ointment (BACTROBAN) 2 % 1 application by Tracheal Tube route daily. Applies to trach-stoma site   Yes [provider]  neomycin-polymyxin B (NEOSPORIN GU IRRIGANT) SOLN 30-40 mLs by GU irrigant route at bedtime. Instilled thru I&O catheter at bedtime-to prevent uti   Yes [provider]  nitrofurantoin (MACRODANTIN) 50 MG capsule Take 50 mg by mouth at bedtime.   Yes [provider]  nystatin (MYCOSTATIN) powder Apply topically 2 (two) times daily as needed. Small amount   Yes [provider]  olopatadine (PATANOL) 0.1 % ophthalmic solution INSTILL 1 DROP INTO BOTH EYES TWICE DAILY 04/17/17  Yes Bobbitt, Heywood Ilesalph Carter, MD  phenazopyridine (PYRIDIUM) 95 MG tablet Take 95 mg by mouth 3 (three) times daily as needed for pain.   Yes [provider]  Phenyleph-Doxylamine-DM-APAP (NYQUIL SEVERE COLD/FLU) 5-6.25-10-325 MG/15ML LIQD Take 30 mLs every 8 (eight) hours as needed by mouth (cold).   Yes [provider]  polyethylene glycol (MIRALAX / GLYCOLAX) packet Take 17 g by mouth daily.   Yes [provider]  sertraline (ZOLOFT) 100 MG tablet Take 100 mg by mouth at bedtime.   Yes [provider]  solifenacin (VESICARE) 10 MG tablet Take 10 mg by mouth daily. Every am   Yes [provider]  sucralfate (CARAFATE) 1 GM/10ML suspension Take 1 g by mouth 3 (three) times daily.    Yes [provider]  topiramate (TOPAMAX) 50 MG tablet Take 1 tablet (50 mg total) by mouth at bedtime. TAKE 1 TO 2 TABLETS(25 TO 50 MG) BY MOUTH AT BEDTIME Patient taking differently: Take 150 mg at bedtime by mouth.  08/29/17  Yes Penumalli, Glenford BayleyVikram R, MD  HYDROcodone-acetaminophen (NORCO/VICODIN) 5-325 MG tablet Take 1-2  tablets by mouth every 6 (six) hours as needed. Patient not taking: Reported on 09/26/2017 08/04/17   Ward, Layla MawKristen N, DO    Family History Family History  Problem Relation Age of Onset  . Diabetes Mother   . Hypertension Mother   . Varicose Veins Mother   . Cancer Maternal Aunt        lung  . Diabetes Maternal Grandfather   . Heart disease Maternal Grandfather   . Heart disease Paternal Grandfather   . Other Sister        overdose    Social History Social History   Tobacco Use  . Smoking status: Never Smoker  . Smokeless tobacco: Never Used  Substance Use Topics  . Alcohol use: No    Alcohol/week: 0.0 oz  . Drug use: No     Allergies   Bee venom; Amoxicillin; Augmentin [amoxicillin-pot clavulanate]; Ciprofloxacin; Tobramycin; Erythromycin; Trazodone; Trazodone and nefazodone; and Morphine and related   Review of Systems Review of Systems  All systems reviewed and negative, other than as noted in HPI.  Physical Exam Updated Vital Signs BP 125/90 (BP Location: Right Arm)   Pulse (!) 104   Temp 99.3 F (37.4 C) (Oral)   Resp 20   Ht 5\' 2"  (1.575 m)   Wt 59 kg (130 lb)   SpO2 94%   BMI 23.78 kg/m   Physical Exam  Constitutional:  She appears well-developed and well-nourished. No distress.  Laying in bed.  No acute distress.  Quadriplegic.  Trach.  HENT:  Head: Normocephalic and atraumatic.  Eyes: Conjunctivae are normal. Right eye exhibits no discharge. Left eye exhibits no discharge.  Neck: Neck supple.  Cardiovascular: Normal rate, regular rhythm and normal heart sounds. Exam reveals no gallop and no friction rub.  No murmur heard. Pulmonary/Chest: Effort normal and breath sounds normal. No respiratory distress.  Abdominal: Soft. She exhibits no distension. There is no tenderness.  Stoma right suprapubic region telemetry appearance.  Healthy appearance.  No surrounding skin changes.  Musculoskeletal: She exhibits no edema or tenderness.  Neurological: She is alert.  Skin: Skin is warm and dry.  Psychiatric: She has a normal mood and affect. Her behavior is normal. Thought content normal.  Nursing note and vitals reviewed.    ED Treatments / Results  Labs (all labs ordered are listed, but only abnormal results are displayed) Labs Reviewed  URINE CULTURE - Abnormal; Notable for the following components:      Result Value   Culture 80,000 COLONIES/mL ESCHERICHIA COLI (*)    Organism ID, Bacteria ESCHERICHIA COLI (*)    All other components within normal limits  COMPREHENSIVE METABOLIC PANEL - Abnormal; Notable for the following components:   Glucose, Bld 103 (*)    Creatinine, Ser <0.30 (*)    All other components within normal limits  CBC - Abnormal; Notable for the following components:   Hemoglobin 15.1 (*)    All other components within normal limits  URINALYSIS, ROUTINE W REFLEX MICROSCOPIC - Abnormal; Notable for the following components:   APPearance HAZY (*)    Hgb urine dipstick MODERATE (*)    Leukocytes, UA LARGE (*)    Bacteria, UA MANY (*)    All other components within normal limits  LIPASE, BLOOD    EKG  EKG Interpretation None       Radiology No results  found.  Procedures Procedures (including critical care time)  Medications Ordered in ED Medications - No data to display   Initial  Impression / Assessment and Plan / ED Course  I have reviewed the triage vital signs and the nursing notes.  Pertinent labs & imaging results that were available during my care of the patient were reviewed by me and considered in my medical decision making (see chart for details).     31 year old female with abdominal pain.  Quadriplegic and self caths her urine.  Given her symptoms and no clear alternative explanation, we will treat for UTI.  I feel she is appropriate for outpatient treatment at this time.  Return precautions were discussed.  Final Clinical Impressions(s) / ED Diagnoses   Final diagnoses:  Abdominal pain, unspecified abdominal location  Urinary tract infection without hematuria, site unspecified    ED Discharge Orders        Ordered    HYDROcodone-acetaminophen (NORCO/VICODIN) 5-325 MG tablet  Every 4 hours PRN     09/26/17 2033    sulfamethoxazole-trimethoprim (BACTRIM DS,SEPTRA DS) 800-160 MG tablet  2 times daily     09/26/17 2033    ondansetron (ZOFRAN) 4 MG tablet  Every 6 hours     09/26/17 2104       Raeford Razor, MD 10/05/17 1429

## 2017-09-29 LAB — URINE CULTURE: Culture: 80000 — AB

## 2017-09-30 ENCOUNTER — Telehealth: Payer: Self-pay

## 2017-09-30 NOTE — Telephone Encounter (Signed)
Pt D/C from ED 09/26/17 on Bactrim. No symptoms of UTI Per Wilford CornerAlexander Law PA-C  D/c abx per UC

## 2017-09-30 NOTE — Progress Notes (Signed)
ED Antimicrobial Stewardship Positive Culture Follow Up   Veronica Huff is an 31 y.o. female who [facility] with a chief complaint of  Chief Complaint  Patient presents with  . Abdominal Pain  . Nausea    Recent Results (from the past 720 hour(s))  Urine culture     Status: Abnormal   Collection Time: 09/26/17  8:40 PM  Result Value Ref Range Status   Specimen Description URINE, CATHETERIZED  Final   Special Requests NONE  Final   Culture 80,000 COLONIES/mL ESCHERICHIA COLI (A)  Final   Report Status 09/29/2017 FINAL  Final   Organism ID, Bacteria ESCHERICHIA COLI (A)  Final      Susceptibility   Escherichia coli - MIC*    AMPICILLIN <=2 SENSITIVE Sensitive     CEFAZOLIN <=4 SENSITIVE Sensitive     CEFTRIAXONE <=1 SENSITIVE Sensitive     CIPROFLOXACIN >=4 RESISTANT Resistant     GENTAMICIN 8 INTERMEDIATE Intermediate     IMIPENEM <=0.25 SENSITIVE Sensitive     NITROFURANTOIN >=512 RESISTANT Resistant     TRIMETH/SULFA >=320 RESISTANT Resistant     AMPICILLIN/SULBACTAM <=2 SENSITIVE Sensitive     PIP/TAZO <=4 SENSITIVE Sensitive     Extended ESBL NEGATIVE Sensitive     * 80,000 COLONIES/mL ESCHERICHIA COLI   dc'd on bactrim but resistant Only 80 k, no urinary symptoms No additional need for abx  ED Provider: Buel ReamAlexandra Law, PA-C  Bertram MillardMichael A Veronica Huff 09/30/2017, 7:46 AM Infectious Diseases Pharmacist Phone# [phone]

## 2017-10-02 ENCOUNTER — Ambulatory Visit: Payer: Medicaid Other | Admitting: Allergy and Immunology

## 2017-10-17 ENCOUNTER — Other Ambulatory Visit: Payer: Self-pay | Admitting: Allergy and Immunology

## 2017-10-17 DIAGNOSIS — H1013 Acute atopic conjunctivitis, bilateral: Secondary | ICD-10-CM

## 2017-10-17 DIAGNOSIS — J3089 Other allergic rhinitis: Secondary | ICD-10-CM

## 2017-10-24 ENCOUNTER — Ambulatory Visit: Payer: Self-pay | Admitting: Neurology

## 2017-10-30 ENCOUNTER — Ambulatory Visit: Payer: Medicaid Other | Admitting: Allergy and Immunology

## 2017-11-09 ENCOUNTER — Other Ambulatory Visit: Payer: Self-pay | Admitting: Registered Nurse

## 2017-11-09 DIAGNOSIS — R519 Headache, unspecified: Secondary | ICD-10-CM

## 2017-11-09 DIAGNOSIS — R51 Headache: Principal | ICD-10-CM

## 2017-11-14 ENCOUNTER — Other Ambulatory Visit: Payer: Self-pay

## 2017-11-14 DIAGNOSIS — J3089 Other allergic rhinitis: Secondary | ICD-10-CM

## 2017-11-15 ENCOUNTER — Other Ambulatory Visit: Payer: Self-pay

## 2017-12-04 ENCOUNTER — Telehealth: Payer: Self-pay | Admitting: *Deleted

## 2017-12-04 NOTE — Telephone Encounter (Signed)
Pt here today thinking her appt was for today with Dr. Terrace ArabiaYan.  She is asking for us to renew her tofranil 150mg  po qhs, that she is taking for her headaches.  She was given this by her pcp, whom she now has changed to Dr. Freddi ChePhilip Anenso.  She is asking for you to fill since you are treating her for headaches.  (she is on zoloft for depression).  Will you do this?

## 2017-12-04 NOTE — Telephone Encounter (Signed)
pls have patient address this with Dr. Terrace ArabiaYan at next office visit, since she is transferring care to her for botox. -VRP

## 2017-12-05 ENCOUNTER — Other Ambulatory Visit: Payer: Self-pay | Admitting: Registered Nurse

## 2017-12-05 DIAGNOSIS — R519 Headache, unspecified: Secondary | ICD-10-CM

## 2017-12-05 DIAGNOSIS — R51 Headache: Principal | ICD-10-CM

## 2017-12-05 NOTE — Telephone Encounter (Signed)
LMVM for pt  (home #, ok per DPR) that per Dr. Marjory LiesPenumalli, since transferring care to Dr. Terrace ArabiaYan for BOTOX, she can address this for you when in next week 12-11-17 at 1530.  Pt is to call back if questions.

## 2017-12-06 ENCOUNTER — Telehealth: Payer: Self-pay | Admitting: Neurology

## 2017-12-06 NOTE — Telephone Encounter (Signed)
The pharmacy has been trying to reach the patient but they have not been able to reach her. I called the patient but she did not answer so I left a VM asking her to call me back.

## 2017-12-11 ENCOUNTER — Telehealth: Payer: Self-pay | Admitting: Neurology

## 2017-12-11 ENCOUNTER — Ambulatory Visit: Payer: Medicaid Other | Admitting: Neurology

## 2017-12-11 ENCOUNTER — Encounter: Payer: Self-pay | Admitting: *Deleted

## 2017-12-11 ENCOUNTER — Encounter: Payer: Self-pay | Admitting: Neurology

## 2017-12-11 VITALS — BP 105/74 | HR 99

## 2017-12-11 DIAGNOSIS — G8250 Quadriplegia, unspecified: Secondary | ICD-10-CM | POA: Diagnosis not present

## 2017-12-11 DIAGNOSIS — R51 Headache: Secondary | ICD-10-CM

## 2017-12-11 DIAGNOSIS — R519 Headache, unspecified: Secondary | ICD-10-CM

## 2017-12-11 MED ORDER — IMIPRAMINE HCL 50 MG PO TABS: ORAL_TABLET | ORAL | 11 refills | Status: DC

## 2017-12-11 NOTE — Progress Notes (Signed)
[facility]   HISTORICAL  CHIEF COMPLAINT:  Chief Complaint  Patient presents with  . 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  She is accompanied by her boyfriend, referred by my colleague Dr. Marjory Lies for EMG guided botulinum toxin injection for spastic quadriplegia.  She suffered catastrophic brainstem stroke, cryptogenic at age 73, required intubation, ventilation support, ICU stay, extensive rehabilitation, she regained marked improvement, but become wheelchair-bound with significant quadriplegia,  She also has tracheostomy due to weak cough, she will use ventilator at nighttime, require   suctioning through her tracheostomy.  She has bowel suppositories every other day, was regular bowel regimen, she had a suprapubic catheter.  She lives at home, has caregiver 90 hours each week, her boyfriend moved in with her since 2018, she need total assistance, feeding, dressing.  But she can operate her wheelchair using a mouthpiece, enjoying her computer work and reading.   Previously she got Botox injection through rehabilitation Dr. Riley Kill, most recent injection was in April 2018, for spastic bilateral lower extremity, use the Botox a.  She complains of bilateral lower extremity with sudden positional change, jackknife contraction of bilateral thigh muscle, uncontrollable muscle jumpy movement.  She does use her upper extremity to gesture when she talks.   I was able to review urology record in October 2018 from [facility], neurogenic bladder, history of augmentation septoplasty, and catheterizable stoma,  MRI of the brain in May 2017, abnormal configuration of the medulla that is related to her previous history of stroke, there is prominence of  the CSF space adjacent to the frontal lobe likely represent mild atrophy, no acute abnormality.  REVIEW OF SYSTEMS: Full 14 system review of systems performed and negative except: dizziness headache chills nausea.  ALLERGIES: Allergies  Allergen Reactions  . Bee Venom Shortness Of Breath and Swelling    SHOCK SHOCK  . Amoxicillin Hives and Itching    Has patient had a PCN reaction causing immediate rash, facial/tongue/throat swelling, SOB or lightheadedness with hypotension: no Has patient had a PCN reaction causing severe rash involving mucus membranes or skin necrosis: no Has patient had a PCN reaction that required hospitalization: no Has patient had a PCN reaction occurring within the last 10 years: unknown If all of the above answers are "NO", then may proceed with Cephalosporin use.   . Augmentin [Amoxicillin-Pot Clavulanate] Hives and Itching  . Ciprofloxacin Hives and Rash  . Tobramycin Hives and Rash  . Erythromycin Other (See Comments)    UNSPECIFIED REACTION  UNSPECIFIED REACTION   . Trazodone Other (See Comments)    UNSPECIFIED REACTION  Eyes shake  . Trazodone And Nefazodone     UNSPECIFIED REACTION   . Morphine And Related Other (See Comments)    "GOES CRAZY" "GOES CRAZY"    HOME MEDICATIONS: Outpatient Medications Prior to Visit  Medication Sig Dispense Refill  . baclofen (LIORESAL) 20 MG tablet Take 2 tablets (40 mg total) by mouth 4 (four) times daily. 240 each 12  . bisacodyl (DULCOLAX) 10 MG suppository Place 10 mg rectally every other day as needed. constipation    . bismuth subsalicylate (PEPTO BISMOL) 262 MG/15ML suspension Take 30 mLs by mouth every 6 (six) hours as needed.    . dantrolene (DANTRIUM) 100 MG capsule Take  1 capsule (100 mg total) by mouth 3 (three) times daily. TAKE 1 CAPSULE(100 MG) BY MOUTH THREE TIMES DAILY 90 capsule 12  . Dextromethorphan HBr (VICKS DAYQUIL COUGH) 15 MG/15ML LIQD Take 30 mLs every 6 (six) hours as needed by mouth  (cough).    . EPIPEN 2-PAK 0.3 MG/0.3ML SOAJ injection USE UTD FOR ALLERGIC REACTION  1  . fluticasone (FLONASE) 50 MCG/ACT nasal spray INSERT 2 SPRAYS INTO EACH NOSTRIL DAILY FOR STUFFY NOSE OR DRAINAGE 16 g 5  . guaiFENesin (MUCINEX) 600 MG 12 hr tablet Take 600 mg by mouth 2 (two) times daily as needed (1-2 tab BID PRN).    Marland Kitchen HYDROcodone-acetaminophen (NORCO/VICODIN) 5-325 MG tablet Take 1 tablet every 4 (four) hours as needed by mouth. 8 tablet 0  . hydrocortisone cream 0.5 % Apply 1 application topically 2 (two) times daily. To hairline and brows    . imipramine (TOFRANIL) 50 MG tablet TAKE 2 TABLETS(100 MG) BY MOUTH AT BEDTIME (Patient taking differently: Take 100-150 mg by mouth at bedtime. ) 60 tablet 4  . ketoconazole (NIZORAL) 2 % shampoo Apply 1 application topically 2 (two) times a week.    . levocetirizine (XYZAL) 5 MG tablet TAKE 1 TABLET BY MOUTH EVERY DAY AS NEEDED 30 tablet 0  . loperamide (IMODIUM A-D) 2 MG tablet Take 4-8 mg by mouth daily as needed for diarrhea or loose stools (not to exceed 4 tablets in 24 hours).    . medroxyPROGESTERone (DEPO-PROVERA) 150 MG/ML injection INJ 1 ML IM Q 13 WEEKS  3  . Methen-Hyosc-Meth Blue-Na Phos (UROGESIC-BLUE) 81.6 MG TABS Take 1 tablet by mouth 4 (four) times daily as needed (pain).     . Multiple Vitamin (MULTIVITAMIN) tablet Take 1 tablet by mouth daily.    . mupirocin ointment (BACTROBAN) 2 % 1 application by Tracheal Tube route daily. Applies to trach-stoma site    . neomycin-polymyxin B (NEOSPORIN GU IRRIGANT) SOLN 30-40 mLs by GU irrigant route at bedtime. Instilled thru I&O catheter at bedtime-to prevent uti    . nitrofurantoin (MACRODANTIN) 50 MG capsule Take 50 mg by mouth at bedtime.    Marland Kitchen nystatin (MYCOSTATIN) powder Apply topically 2 (two) times daily as needed. Small amount    . olopatadine (PATANOL) 0.1 % ophthalmic solution INSTILL 1 DROP INTO BOTH EYES TWICE DAILY 5 mL 5  . ondansetron (ZOFRAN) 4 MG tablet Take 1 tablet (4  mg total) every 6 (six) hours by mouth. 12 tablet 0  . phenazopyridine (PYRIDIUM) 95 MG tablet Take 95 mg by mouth 3 (three) times daily as needed for pain.    Marland Kitchen Phenyleph-Doxylamine-DM-APAP (NYQUIL SEVERE COLD/FLU) 5-6.25-10-325 MG/15ML LIQD Take 30 mLs every 8 (eight) hours as needed by mouth (cold).    . polyethylene glycol (MIRALAX / GLYCOLAX) packet Take 17 g by mouth daily.    . sertraline (ZOLOFT) 100 MG tablet Take 100 mg by mouth at bedtime.    . solifenacin (VESICARE) 10 MG tablet Take 10 mg by mouth daily. Every am    . sucralfate (CARAFATE) 1 GM/10ML suspension Take 1 g by mouth 3 (three) times daily.     Marland Kitchen topiramate (TOPAMAX) 50 MG tablet Take 1 tablet (50 mg total) by mouth at bedtime. TAKE 1 TO 2 TABLETS(25 TO 50 MG) BY MOUTH AT BEDTIME (Patient taking differently: Take 150 mg at bedtime by mouth. ) 30 tablet 12   No facility-administered medications prior to visit.     PAST MEDICAL HISTORY: Past Medical History:  Diagnosis Date  . Anxiety   . CVA (cerebral infarction)    Ischemic brainstem stroke in 2011 at age 47 due to unknown reasons--quadriplegic after stroke.  . Depression    Zoloft has helped-not having any current depression  . Edema extremities    Feet and legs-mostly in feet  . Headache(784.0)    Prior to imipramine-none currently  . Pneumonia   . Quadriplegia (HCC)    Secondary to stroke at age 67-has trach-lives with boyfriend-motorized w/c-joystick to chin to drive w/c--I & O cath thru suprapubic stoma - has nursing care 16 hrs daily and boyfriend able to help pt the other hours of day.  . Recurrent UTI (urinary tract infection)    Due to suprapubic stoma, repetitive catheterizations  . Sleep apnea    On ventilator at night -has oxygen but rarely needs unless she has a cold and sats drop  . Stroke [facility])     PAST SURGICAL HISTORY: Past Surgical History:  Procedure Laterality Date  . BLADDER SURGERY  2009,2012  . BRONCHOSCOPY  2006  . CHOLECYSTECTOMY   11/18/2012   Procedure: LAPAROSCOPIC CHOLECYSTECTOMY;  Surgeon: Axel Filler, MD;  Location: [facility] ORS;  Service: General;  Laterality: N/A;  . ESOPHAGOGASTRODUODENOSCOPY    . GASTROSTOMY TUBE PLACEMENT  2000  . ILEOSTOMY  2006  . MICROLARYNGOSCOPY WITH LASER AND BALLOON DILATION N/A 09/27/2016   Procedure: MICROLARYNGOSCOPY WITH LASER AND BALLOON DILATION;  Surgeon: Christia Reading, MD;  Location[facility] OR;  Service: ENT;  Laterality: N/A;  micro direct laryngoscopy with co2 laser balloon dilation of trachea  . REMOVAL OF GASTROSTOMY TUBE    . SPINE SURGERY  2004,2011  . TENDON REPAIR  2012   Left wrist  . THROAT SURGERY  2004  . TRACHEOSTOMY  2000  . wirst surgery Left   . WISDOM TOOTH EXTRACTION  2008    FAMILY HISTORY: Family History  Problem Relation Age of Onset  . Diabetes Mother   . Hypertension Mother   . Varicose Veins Mother   . Cancer Maternal Aunt        lung  . Diabetes Maternal Grandfather   . Heart disease Maternal Grandfather   . Heart disease Paternal Grandfather   . Other Sister        overdose    SOCIAL HISTORY:  Social History   Socioeconomic History  . Marital status: Single    Spouse name: Not on file  . Number of children: Not on file  . Years of education: college   . Highest education level: Not on file  Social Needs  . Financial resource strain: Not on file  . Food insecurity - worry: Not on file  . Food insecurity - inability: Not on file  . Transportation needs - medical: Not on file  . Transportation needs - non-medical: Not on file  Occupational History  . Occupation: disabled  Tobacco Use  . Smoking status: Never Smoker  . Smokeless tobacco: Never Used  Substance and Sexual Activity  . Alcohol use: No    Alcohol/week: 0.0 oz  . Drug use: No  . Sexual activity: Yes    Birth control/protection: None  Other Topics Concern  . Not on file  Social History Narrative   08/29/17 Lives at home with boyfriend, Has a RN Sharyl Nimrod that helps to  care for her    Drinks no caffeine      PHYSICAL EXAM  Vitals:   12/11/17 1434  BP: 105/74  Pulse: 99  PHYSICAL EXAMNIATION:  Gen: NAD, conversant, well nourised, obese, well groomed                     Cardiovascular: Regular rate rhythm, no peripheral edema, warm, nontender. Eyes: Conjunctivae clear without exudates or hemorrhage Neck: Supple, no carotid bruits. Pulmonary: Clear to auscultation bilaterally   NEUROLOGICAL EXAM:  MENTAL STATUS: Speech/cognivition:  Awake, alert, weak voice, mild shortness of breath with prolonged talking   CRANIAL NERVES: CN II: Visual fields are full to confrontation. Fundoscopic exam is normal with sharp discs and no vascular changes. Pupils are round equal and briskly reactive to light. CN III, IV, VI: extraocular movement are normal.  End gaze rotatory nystagmus CN V: Facial sensation is intact to pinprick in all 3 divisions bilaterally. Corneal responses are intact.  CN VII: Face is symmetric with normal eye closure and smile. CN VIII: Hearing is normal to rubbing fingers CN IX, X: Palate elevates symmetrically. Phonation is normal. CN XI: Head turning and shoulder shrug are intact CN XII: Tongue is midline with normal movements and no atrophy.  MOTOR: Wheelchair dependent, spastic quadriplegia, no antigravity movement of bilateral shoulder muscles, able to flex bilateral elbow against gravity, trace movement of bilateral fingers, mild to moderate spasticity of bilateral lower extremity muscles, no significant movement  REFLEXES: Hyperreflexia of bilateral upper and lower extremities, sustained clonus, she also has bilateral hip flexion, when changing position  SENSORY: Intact to light touch   COORDINATION: Could not perform GAIT/STANCE: Wheelchair-bound    DIAGNOSTIC DATA (LABS, IMAGING, TESTING) - I reviewed patient records, labs, notes, testing and imaging myself where available.  Lab Results  Component Value Date     WBC 4.1 09/26/2017   HGB 15.1 (H) 09/26/2017   HCT 43.6 09/26/2017   MCV 85.8 09/26/2017   PLT 162 09/26/2017      Component Value Date/Time   NA 139 09/26/2017 1500   K 3.8 09/26/2017 1500   CL 105 09/26/2017 1500   CO2 26 09/26/2017 1500   GLUCOSE 103 (H) 09/26/2017 1500   BUN 10 09/26/2017 1500   CREATININE <0.30 (L) 09/26/2017 1500   CALCIUM 9.3 09/26/2017 1500   PROT 8.0 09/26/2017 1500   ALBUMIN 4.0 09/26/2017 1500   AST 34 09/26/2017 1500   ALT 31 09/26/2017 1500   ALKPHOS 79 09/26/2017 1500   BILITOT 0.8 09/26/2017 1500   GFRNONAA NOT CALCULATED 09/26/2017 1500   GFRAA NOT CALCULATED 09/26/2017 1500    ASSESSMENT AND PLAN  32 y.o. year old female  Spastic quadriplegia, wheelchair dependent since her brainstem stroke at age 32   EMG guided xeomin injection for spastic quadriplegia  We used 600 units of xeomin today under the electric stimulation   Right adductor longus 100 Right adductor magnus 50 Right iliopsoas muscle 50   Left adductor longus 100 Left adductor magnus 50 Left iliopsoas 50 units  Right brachialis 50 Right pronator teres 50 Right pectoralis major 25  Left brachialis 50 Left pronator teres 50 unit   Levert FeinsteinYijun Ivah Girardot, M.D. Ph.D.  [facility] [address] [place], [place] [address] Phone: [phone] Fax:      [phone]

## 2017-12-11 NOTE — Telephone Encounter (Signed)
Calling patient to schedule 3 month follow-up.

## 2017-12-11 NOTE — Progress Notes (Signed)
**  Xeomin 100 units x 6 vials, NDC [id number], Lot [id number], Exp 10/2019, specialty pharmacy.//mck,rn**

## 2017-12-11 NOTE — Telephone Encounter (Signed)
Error

## 2018-01-08 ENCOUNTER — Telehealth: Payer: Self-pay | Admitting: Neurology

## 2018-01-08 ENCOUNTER — Encounter: Payer: Self-pay | Admitting: Allergy and Immunology

## 2018-01-08 ENCOUNTER — Other Ambulatory Visit: Payer: Self-pay | Admitting: Allergy and Immunology

## 2018-01-08 ENCOUNTER — Ambulatory Visit: Payer: Medicaid Other | Admitting: Allergy and Immunology

## 2018-01-08 DIAGNOSIS — J3089 Other allergic rhinitis: Secondary | ICD-10-CM

## 2018-01-08 DIAGNOSIS — H1013 Acute atopic conjunctivitis, bilateral: Secondary | ICD-10-CM

## 2018-01-08 MED ORDER — LEVOCETIRIZINE DIHYDROCHLORIDE 5 MG PO TABS: 5.0000 mg | ORAL_TABLET | Freq: Every day | ORAL | 0 refills | Status: DC | PRN

## 2018-01-08 MED ORDER — OLOPATADINE HCL 0.7 % OP SOLN: 1.0000 [drp] | OPHTHALMIC | 5 refills | Status: DC

## 2018-01-08 NOTE — Patient Instructions (Addendum)
Allergic rhinitis Well-controlled on current regimen.  Continue appropriate allergen avoidance measures, levocetirizine 5 mg daily as needed, and fluticasone nasal spray 2 sprays per nostril daily as needed.  Refills have been divided for levocetirizine and fluticasone nasal spray.  Allergic conjunctivitis Due to insurance coverage changes, the patient was switched from Paseo to Patanol.  The patient failed treatment with Patanol.  Treatment plan as outlined above for allergic rhinitis.  A refill prescription has been provided for Pazeo, one drop per eye daily as needed.  Prior authorization will be submitted if needed.  I have also recommended eye lubricant drops (i.e., Natural Tears) as needed.   Return in about 1 year (around 01/08/2019), or if symptoms worsen or fail to improve.

## 2018-01-08 NOTE — Telephone Encounter (Signed)
Spoke to patient - she feels her spasms are worse since having Xeomin injections on 12/11/17.  She would like to ask if Dr. Terrace ArabiaYan will try Botox for her next injections.  States Botox has been beneficial in the past.

## 2018-01-08 NOTE — Assessment & Plan Note (Signed)
Due to insurance coverage changes, the patient was switched from Paseo to Patanol.  The patient failed treatment with Patanol.  Treatment plan as outlined above for allergic rhinitis.  A refill prescription has been provided for Pazeo, one drop per eye daily as needed.  Prior authorization will be submitted if needed.  I have also recommended eye lubricant drops (i.e., Natural Tears) as needed.

## 2018-01-08 NOTE — Progress Notes (Signed)
Follow-up Note  RE: Veronica Huff MRN: [id number] DOB: 1986/06/14 Date of Office Visit: 01/08/2018  Primary care provider: Annita BrodAsenso, Philip, MD Referring provider: Annita BrodAsenso, Philip, MD  History of present illness: Veronica Huff is a 32 y.o. female with allergic rhinoconjunctivitis presenting today for follow-up.  She was last seen in this clinic in September 2017.  She complains of itchy, running eyes.  She notes that her ocular symptoms have been well controlled while on Pazeo, however her insurance stopped paying for this medication and she was switched to Patanol which has not provided adequate symptom relief.  Her nasal allergy symptoms have been well controlled with fluticasone nasal spray and levocetirizine.   Assessment and plan: Allergic rhinitis Well-controlled on current regimen.  Continue appropriate allergen avoidance measures, levocetirizine 5 mg daily as needed, and fluticasone nasal spray 2 sprays per nostril daily as needed.  Refills have been divided for levocetirizine and fluticasone nasal spray.  Allergic conjunctivitis Due to insurance coverage changes, the patient was switched from Paseo to Patanol.  The patient failed treatment with Patanol.  Treatment plan as outlined above for allergic rhinitis.  A refill prescription has been provided for Pazeo, one drop per eye daily as needed.  Prior authorization will be submitted if needed.  I have also recommended eye lubricant drops (i.e., Natural Tears) as needed.   Meds ordered this encounter  Medications  . levocetirizine (XYZAL) 5 MG tablet    Sig: Take 1 tablet (5 mg total) by mouth daily as needed.    Dispense:  30 tablet    Refill:  0    Patient must have office visit for further refills.  . Olopatadine HCl (PAZEO) 0.7 % SOLN    Sig: Place 1 drop into both eyes 1 day or 1 dose.    Dispense:  1 Bottle    Refill:  5   Physical examination: Blood pressure 110/62, pulse 100, temperature 98.4 F (36.9  C), temperature source Oral, resp. rate 20.  General: Alert, interactive, in no acute distress. HEENT: TMs pearly gray, turbinates mildly edematous without discharge, post-pharynx unremarkable. Neck: Supple without lymphadenopathy. Lungs: Clear to auscultation without wheezing, rhonchi or rales. CV: Normal S1, S2 without murmurs. Skin: Warm and dry, without lesions or rashes.  The following portions of the patient's history were reviewed and updated as appropriate: allergies, current medications, past family history, past medical history, past social history, past surgical history and problem list.  Allergies as of 01/08/2018      Reactions   Bee Venom Shortness Of Breath, Swelling   SHOCK SHOCK   Amoxicillin Hives, Itching   Has patient had a PCN reaction causing immediate rash, facial/tongue/throat swelling, SOB or lightheadedness with hypotension: no Has patient had a PCN reaction causing severe rash involving mucus membranes or skin necrosis: no Has patient had a PCN reaction that required hospitalization: no Has patient had a PCN reaction occurring within the last 10 years: unknown If all of the above answers are "NO", then may proceed with Cephalosporin use.   Augmentin [amoxicillin-pot Clavulanate] Hives, Itching   Ciprofloxacin Hives, Rash   Tobramycin Hives, Rash   Erythromycin Other (See Comments)   UNSPECIFIED REACTION  UNSPECIFIED REACTION    Trazodone Other (See Comments)   UNSPECIFIED REACTION  Eyes shake   Trazodone And Nefazodone    UNSPECIFIED REACTION    Morphine And Related Other (See Comments)   "GOES CRAZY" "GOES CRAZY"      Medication List  Accurate as of 01/08/18  1:07 PM. Always use your most recent med list.          baclofen 20 MG tablet Commonly known as:  LIORESAL Take 2 tablets (40 mg total) by mouth 4 (four) times daily.   bisacodyl 10 MG suppository Commonly known as:  DULCOLAX Place 10 mg rectally every other day as needed.  constipation   bismuth subsalicylate 262 MG/15ML suspension Commonly known as:  PEPTO BISMOL Take 30 mLs by mouth every 6 (six) hours as needed.   dantrolene 100 MG capsule Commonly known as:  DANTRIUM Take 1 capsule (100 mg total) by mouth 3 (three) times daily. TAKE 1 CAPSULE(100 MG) BY MOUTH THREE TIMES DAILY   EPIPEN 2-PAK 0.3 mg/0.3 mL Soaj injection Generic drug:  EPINEPHrine USE UTD FOR ALLERGIC REACTION   fluticasone 50 MCG/ACT nasal spray Commonly known as:  FLONASE INSERT 2 SPRAYS INTO EACH NOSTRIL DAILY FOR STUFFY NOSE OR DRAINAGE   guaiFENesin 600 MG 12 hr tablet Commonly known as:  MUCINEX Take 600 mg by mouth 2 (two) times daily as needed (1-2 tab BID PRN).   HYDROcodone-acetaminophen 5-325 MG tablet Commonly known as:  NORCO/VICODIN Take 1 tablet every 4 (four) hours as needed by mouth.   hydrocortisone cream 0.5 % Apply 1 application topically 2 (two) times daily. To hairline and brows   imipramine 50 MG tablet Commonly known as:  TOFRANIL TAKE 2 TABLETS(100 MG) BY MOUTH AT BEDTIME   ketoconazole 2 % shampoo Commonly known as:  NIZORAL Apply 1 application topically 2 (two) times a week.   levocetirizine 5 MG tablet Commonly known as:  XYZAL Take 1 tablet (5 mg total) by mouth daily as needed.   loperamide 2 MG tablet Commonly known as:  IMODIUM A-D Take 4-8 mg by mouth daily as needed for diarrhea or loose stools (not to exceed 4 tablets in 24 hours).   medroxyPROGESTERone 150 MG/ML injection Commonly known as:  DEPO-PROVERA INJ 1 ML IM Q 13 WEEKS   multivitamin tablet Take 1 tablet by mouth daily.   mupirocin ointment 2 % Commonly known as:  BACTROBAN 1 application by Tracheal Tube route daily. Applies to trach-stoma site   NEOSPORIN GU IRRIGANT Soln Generic drug:  neomycin-polymyxin B 30-40 mLs by GU irrigant route at bedtime. Instilled thru I&O catheter at bedtime-to prevent uti   nitrofurantoin 50 MG capsule Commonly known as:   MACRODANTIN Take 50 mg by mouth at bedtime.   NYQUIL SEVERE COLD/FLU 5-6.25-10-325 MG/15ML Liqd Generic drug:  Phenyleph-Doxylamine-DM-APAP Take 30 mLs every 8 (eight) hours as needed by mouth (cold).   nystatin powder Commonly known as:  MYCOSTATIN/NYSTOP Apply topically 2 (two) times daily as needed. Small amount   olopatadine 0.1 % ophthalmic solution Commonly known as:  PATANOL INSTILL 1 DROP INTO BOTH EYES TWICE DAILY   Olopatadine HCl 0.7 % Soln Commonly known as:  PAZEO Place 1 drop into both eyes 1 day or 1 dose.   ondansetron 4 MG tablet Commonly known as:  ZOFRAN Take 1 tablet (4 mg total) every 6 (six) hours by mouth.   phenazopyridine 95 MG tablet Commonly known as:  PYRIDIUM Take 95 mg by mouth 3 (three) times daily as needed for pain.   polyethylene glycol packet Commonly known as:  MIRALAX / GLYCOLAX Take 17 g by mouth daily.   sertraline 100 MG tablet Commonly known as:  ZOLOFT Take 100 mg by mouth at bedtime.   solifenacin 10 MG tablet Commonly known as:  VESICARE Take 10 mg by mouth daily. Every am   sucralfate 1 GM/10ML suspension Commonly known as:  CARAFATE Take 1 g by mouth 3 (three) times daily.   topiramate 50 MG tablet Commonly known as:  TOPAMAX Take 1 tablet (50 mg total) by mouth at bedtime. TAKE 1 TO 2 TABLETS(25 TO 50 MG) BY MOUTH AT BEDTIME   UROGESIC-BLUE 81.6 MG Tabs Take 1 tablet by mouth 4 (four) times daily as needed (pain).   VICKS DAYQUIL COUGH 15 MG/15ML Liqd Generic drug:  Dextromethorphan HBr Take 30 mLs every 6 (six) hours as needed by mouth (cough).       Allergies  Allergen Reactions  . Bee Venom Shortness Of Breath and Swelling    SHOCK SHOCK  . Amoxicillin Hives and Itching    Has patient had a PCN reaction causing immediate rash, facial/tongue/throat swelling, SOB or lightheadedness with hypotension: no Has patient had a PCN reaction causing severe rash involving mucus membranes or skin necrosis: no Has  patient had a PCN reaction that required hospitalization: no Has patient had a PCN reaction occurring within the last 10 years: unknown If all of the above answers are "NO", then may proceed with Cephalosporin use.   . Augmentin [Amoxicillin-Pot Clavulanate] Hives and Itching  . Ciprofloxacin Hives and Rash  . Tobramycin Hives and Rash  . Erythromycin Other (See Comments)    UNSPECIFIED REACTION  UNSPECIFIED REACTION   . Trazodone Other (See Comments)    UNSPECIFIED REACTION  Eyes shake  . Trazodone And Nefazodone     UNSPECIFIED REACTION   . Morphine And Related Other (See Comments)    "GOES CRAZY" "GOES CRAZY"    I appreciate the opportunity to take part in Woodrow's care. Please do not hesitate to contact me with questions.  Sincerely,   R. Jorene Guest, MD

## 2018-01-08 NOTE — Telephone Encounter (Signed)
Pt calling re: the injection that she had on her last time in office.  Pt said it has done her no good, she thinks it has made things worst.  Pt is asking for a call back

## 2018-01-08 NOTE — Assessment & Plan Note (Signed)
Well-controlled on current regimen.  Continue appropriate allergen avoidance measures, levocetirizine 5 mg daily as needed, and fluticasone nasal spray 2 sprays per nostril daily as needed.  Refills have been divided for levocetirizine and fluticasone nasal spray.

## 2018-01-09 NOTE — Telephone Encounter (Signed)
Noted, I will get approval for botox.

## 2018-01-09 NOTE — Telephone Encounter (Signed)
Ok per vo by Dr. Terrace ArabiaYan, to attempt approval for Botox 400 units.  Returned call to patient to give her an update.  We will leave her next injection appt scheduled for 03/06/18 in hopes of the Botox being approved.  Duwayne HeckDanielle will reach out to her insurance and let the patient know when it is approved.  We will need to cancel her appt if Botox is denied.

## 2018-01-16 ENCOUNTER — Telehealth: Payer: Self-pay

## 2018-01-16 NOTE — Telephone Encounter (Signed)
No, Pazeo is once a day (Patanol is twice a day but it is weaker).

## 2018-01-16 NOTE — Telephone Encounter (Signed)
I spoke with Siria's nurse and informed her that it should only be once daily.

## 2018-01-16 NOTE — Telephone Encounter (Signed)
Called nurse and informed her that Veronica Huff is to be 1 drop in both eyes once daily as needed. She stated that Dr. Nunzio CobbsBobbitt signed a paper that she had and it states it should be done twice a day. She stated that she was on Patanol and was doing it twice a day and I informed her that Patanol is done twice a day however Pazeo is only done once a day. She wanted clarification from the Doctor on how many times a day she needs to do this.  Please advise.

## 2018-01-16 NOTE — Telephone Encounter (Signed)
Patients nurse Sharyl NimrodMeredith is calling stating the patient needs more pazeo due to her taking it 2x a day.  [facility] [address]  Please Advise

## 2018-02-01 ENCOUNTER — Other Ambulatory Visit: Payer: Self-pay | Admitting: Allergy and Immunology

## 2018-02-01 DIAGNOSIS — J3089 Other allergic rhinitis: Secondary | ICD-10-CM

## 2018-02-01 DIAGNOSIS — H1013 Acute atopic conjunctivitis, bilateral: Secondary | ICD-10-CM

## 2018-03-05 ENCOUNTER — Ambulatory Visit: Payer: Medicaid Other | Admitting: Allergy and Immunology

## 2018-03-06 ENCOUNTER — Ambulatory Visit: Payer: Medicaid Other | Admitting: Neurology

## 2018-03-19 ENCOUNTER — Ambulatory Visit: Payer: Medicaid Other | Admitting: Allergy and Immunology

## 2018-04-08 ENCOUNTER — Telehealth: Payer: Self-pay | Admitting: Neurology

## 2018-04-08 NOTE — Telephone Encounter (Signed)
Botox has been scheduled for tomorrow 04/09/18 per Sabrina/[facility] [phone]

## 2018-04-09 NOTE — Telephone Encounter (Signed)
Noted, thank you

## 2018-04-11 ENCOUNTER — Ambulatory Visit: Payer: Medicaid Other | Admitting: Neurology

## 2018-04-11 ENCOUNTER — Encounter: Payer: Self-pay | Admitting: Neurology

## 2018-04-11 VITALS — BP 109/71 | HR 91

## 2018-04-11 DIAGNOSIS — G8250 Quadriplegia, unspecified: Secondary | ICD-10-CM

## 2018-04-11 NOTE — Progress Notes (Signed)
[facility]   HISTORICAL  CHIEF COMPLAINT:  Chief Complaint  Patient presents with  . Spastic Tetraplegia    Botox 100 units x 4 vials - specialty pharmacy    HISTORY OF PRESENT ILLNESS:  Veronica Huff is a 32 year old female, suffered a brainstem stroke at age 65 years old, with residual spastic quadriplegia, neurogenic bladder.  She is accompanied by her boyfriend, referred by my colleague Dr. Marjory Lies for EMG guided botulinum toxin injection for spastic quadriplegia.  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  She has bowel suppositories every other day, was regular bowel regimen, she had a suprapubic catheter.  She lives at home, has caregiver 90 hours each week, her boyfriend moved in with her since 2018, she need total assistance, feeding, dressing.  But she can operate her wheelchair using a mouthpiece, enjoying her computer work and reading.   Previously she got Botox injection through rehabilitation Dr. Riley Kill, most recent injection was in April 2018, for spastic bilateral lower extremity, use the Botox a.  She complains of bilateral lower extremity with sudden positional change, jackknife contraction of bilateral thigh muscle, uncontrollable muscle jumpy movement.  She does use her upper extremity to gesture when she talks.   I was able to review urology record in October 2018 from [facility], neurogenic bladder, history of augmentation septoplasty, and catheterizable stoma,  MRI of the brain in May 2017, abnormal configuration of the medulla that is related to her previous history of stroke, there is prominence of the CSF space adjacent  to the frontal lobe likely represent mild atrophy, no acute abnormality.  UPDATE Apr 11 2018: She is accompanied by her boyfriend at today's clinical visit, she did not respond to previous Botox injection on January 11, 2018, when BOTOX was delivered to bilateral adductor and hip flexor.  I reviewed multiple previous injection note by Dr. Riley Kill, injection was placed at bilateral hamstring muscles, patient reported she responded better to that injection pattern,   REVIEW OF SYSTEMS: Full 14 system review of systems performed and negative except: dizziness headache chills nausea.  ALLERGIES: Allergies  Allergen Reactions  . Bee Venom Shortness Of Breath and Swelling    SHOCK SHOCK  . Amoxicillin Hives and Itching    Has patient had a PCN reaction causing immediate rash, facial/tongue/throat swelling, SOB or lightheadedness with hypotension: no Has patient had a PCN reaction causing severe rash involving mucus membranes or skin necrosis: no Has patient had a PCN reaction that required hospitalization: no Has patient had a PCN reaction occurring within the last 10 years: unknown If all of the above answers are "NO", then may proceed with Cephalosporin use.   . Augmentin [Amoxicillin-Pot Clavulanate] Hives and Itching  . Ciprofloxacin Hives and Rash  . Tobramycin Hives and Rash  . Erythromycin Other (See Comments)    UNSPECIFIED REACTION  UNSPECIFIED REACTION   . Trazodone Other (See Comments)    UNSPECIFIED REACTION  Eyes shake  . Trazodone And Nefazodone     UNSPECIFIED REACTION   . Morphine And Related Other (See Comments)    "GOES CRAZY" "GOES CRAZY"    HOME MEDICATIONS: Outpatient Medications Prior to Visit  Medication Sig Dispense Refill  . baclofen (LIORESAL) 20 MG tablet Take 2 tablets (  40 mg total) by mouth 4 (four) times daily. 240 each 12  . bisacodyl (DULCOLAX) 10 MG suppository Place 10 mg rectally every other day as needed. constipation    . bismuth  subsalicylate (PEPTO BISMOL) 262 MG/15ML suspension Take 30 mLs by mouth every 6 (six) hours as needed.    . dantrolene (DANTRIUM) 100 MG capsule Take 1 capsule (100 mg total) by mouth 3 (three) times daily. TAKE 1 CAPSULE(100 MG) BY MOUTH THREE TIMES DAILY 90 capsule 12  . Dextromethorphan HBr (VICKS DAYQUIL COUGH) 15 MG/15ML LIQD Take 30 mLs every 6 (six) hours as needed by mouth (cough).    . EPIPEN 2-PAK 0.3 MG/0.3ML SOAJ injection USE UTD FOR ALLERGIC REACTION  1  . fluticasone (FLONASE) 50 MCG/ACT nasal spray INSERT 2 SPRAYS IN EACH NOSTRIL EVERY DAY FOR STUFFY NOSE OR DRAINAGE 16 g 5  . guaiFENesin (MUCINEX) 600 MG 12 hr tablet Take 600 mg by mouth 2 (two) times daily as needed (1-2 tab BID PRN).    Marland Kitchen HYDROcodone-acetaminophen (NORCO/VICODIN) 5-325 MG tablet Take 1 tablet every 4 (four) hours as needed by mouth. 8 tablet 0  . hydrocortisone cream 0.5 % Apply 1 application topically 2 (two) times daily. To hairline and brows    . imipramine (TOFRANIL) 50 MG tablet TAKE 2 TABLETS(100 MG) BY MOUTH AT BEDTIME 60 tablet 11  . ketoconazole (NIZORAL) 2 % shampoo Apply 1 application topically 2 (two) times a week.    . levocetirizine (XYZAL) 5 MG tablet TAKE 1 TABLET BY MOUTH EVERY DAY AS NEEDED. NEEDS OFFICE VISIT FOR FURTHER REFILLS 30 tablet 11  . loperamide (IMODIUM A-D) 2 MG tablet Take 4-8 mg by mouth daily as needed for diarrhea or loose stools (not to exceed 4 tablets in 24 hours).    . medroxyPROGESTERone (DEPO-PROVERA) 150 MG/ML injection INJ 1 ML IM Q 13 WEEKS  3  . Methen-Hyosc-Meth Blue-Na Phos (UROGESIC-BLUE) 81.6 MG TABS Take 1 tablet by mouth 4 (four) times daily as needed (pain).     . Multiple Vitamin (MULTIVITAMIN) tablet Take 1 tablet by mouth daily.    . mupirocin ointment (BACTROBAN) 2 % 1 application by Tracheal Tube route daily. Applies to trach-stoma site    . neomycin-polymyxin B (NEOSPORIN GU IRRIGANT) SOLN 30-40 mLs by GU irrigant route at bedtime. Instilled thru I&O  catheter at bedtime-to prevent uti    . nitrofurantoin (MACRODANTIN) 50 MG capsule Take 50 mg by mouth at bedtime.    Marland Kitchen nystatin (MYCOSTATIN) powder Apply topically 2 (two) times daily as needed. Small amount    . olopatadine (PATANOL) 0.1 % ophthalmic solution INSTILL 1 DROP INTO BOTH EYES TWICE DAILY 5 mL 5  . Olopatadine HCl (PAZEO) 0.7 % SOLN Place 1 drop into both eyes 1 day or 1 dose. 1 Bottle 5  . ondansetron (ZOFRAN) 4 MG tablet Take 1 tablet (4 mg total) every 6 (six) hours by mouth. 12 tablet 0  . phenazopyridine (PYRIDIUM) 95 MG tablet Take 95 mg by mouth 3 (three) times daily as needed for pain.    Marland Kitchen Phenyleph-Doxylamine-DM-APAP (NYQUIL SEVERE COLD/FLU) 5-6.25-10-325 MG/15ML LIQD Take 30 mLs every 8 (eight) hours as needed by mouth (cold).    . polyethylene glycol (MIRALAX / GLYCOLAX) packet Take 17 g by mouth daily.    . sertraline (ZOLOFT) 100 MG tablet Take 100 mg by mouth at bedtime.    . solifenacin (VESICARE) 10 MG tablet Take 10 mg by mouth daily. Every am    .  sucralfate (CARAFATE) 1 GM/10ML suspension Take 1 g by mouth 3 (three) times daily.     Marland Kitchen topiramate (TOPAMAX) 50 MG tablet Take 1 tablet (50 mg total) by mouth at bedtime. TAKE 1 TO 2 TABLETS(25 TO 50 MG) BY MOUTH AT BEDTIME (Patient taking differently: Take 150 mg at bedtime by mouth. ) 30 tablet 12   No facility-administered medications prior to visit.     PAST MEDICAL HISTORY: Past Medical History:  Diagnosis Date  . Anxiety   . CVA (cerebral infarction)    Ischemic brainstem stroke in 2011 at age 20 due to unknown reasons--quadriplegic after stroke.  . Depression    Zoloft has helped-not having any current depression  . Edema extremities    Feet and legs-mostly in feet  . Headache(784.0)    Prior to imipramine-none currently  . Pneumonia   . Quadriplegia (HCC)    Secondary to stroke at age 76-has trach-lives with boyfriend-motorized w/c-joystick to chin to drive w/c--I & O cath thru suprapubic stoma - has  nursing care 16 hrs daily and boyfriend able to help pt the other hours of day.  . Recurrent UTI (urinary tract infection)    Due to suprapubic stoma, repetitive catheterizations  . Sleep apnea    On ventilator at night -has oxygen but rarely needs unless she has a cold and sats drop  . Stroke [facility])     PAST SURGICAL HISTORY: Past Surgical History:  Procedure Laterality Date  . BLADDER SURGERY  2009,2012  . BRONCHOSCOPY  2006  . CHOLECYSTECTOMY  11/18/2012   Procedure: LAPAROSCOPIC CHOLECYSTECTOMY;  Surgeon: Axel Filler, MD;  Location: [facility]S;  Service: General;  Laterality: N/A;  . ESOPHAGOGASTRODUODENOSCOPY    . GASTROSTOMY TUBE PLACEMENT  2000  . ILEOSTOMY  2006  . MICROLARYNGOSCOPY WITH LASER AND BALLOON DILATION N/A 09/27/2016   Procedure: MICROLARYNGOSCOPY WITH LASER AND BALLOON DILATION;  Surgeon: Christia Reading, MD;  Location: [facility] OR;  Service: ENT;  Laterality: N/A;  micro direct laryngoscopy with co2 laser balloon dilation of trachea  . REMOVAL OF GASTROSTOMY TUBE    . SPINE SURGERY  2004,2011  . TENDON REPAIR  2012   Left wrist  . THROAT SURGERY  2004  . TRACHEOSTOMY  2000  . wirst surgery Left   . WISDOM TOOTH EXTRACTION  2008    FAMILY HISTORY: Family History  Problem Relation Age of Onset  . Diabetes Mother   . Hypertension Mother   . Varicose Veins Mother   . Cancer Maternal Aunt        lung  . Diabetes Maternal Grandfather   . Heart disease Maternal Grandfather   . Heart disease Paternal Grandfather   . Other Sister        overdose    SOCIAL HISTORY:  Social History   Socioeconomic History  . Marital status: Single    Spouse name: Not on file  . Number of children: Not on file  . Years of education: college   . Highest education level: Not on file  Occupational History  . Occupation: disabled  Social Needs  . Financial resource strain: Not on file  . Food insecurity:    Worry: Not on file    Inability: Not on file  . Transportation needs:      Medical: Not on file    Non-medical: Not on file  Tobacco Use  . Smoking status: Never Smoker  . Smokeless tobacco: Never Used  Substance and Sexual Activity  . Alcohol use: No  Alcohol/week: 0.0 oz  . Drug use: No    Types: Marijuana  . Sexual activity: Yes    Birth control/protection: None  Lifestyle  . Physical activity:    Days per week: Not on file    Minutes per session: Not on file  . Stress: Not on file  Relationships  . Social connections:    Talks on phone: Not on file    Gets together: Not on file    Attends religious service: Not on file    Active member of club or organization: Not on file    Attends meetings of clubs or organizations: Not on file    Relationship status: Not on file  . Intimate partner violence:    Fear of current or ex partner: Not on file    Emotionally abused: Not on file    Physically abused: Not on file    Forced sexual activity: Not on file  Other Topics Concern  . Not on file  Social History Narrative   08/29/17 Lives at home with boyfriend, Has a RN Sharyl Nimrod that helps to care for her    Drinks no caffeine      PHYSICAL EXAM  Vitals:   04/11/18 1456  BP: 109/71  Pulse: 91    PHYSICAL EXAMNIATION:  Gen: NAD, conversant, well nourised, obese, well groomed                     Cardiovascular: Regular rate rhythm, no peripheral edema, warm, nontender. Eyes: Conjunctivae clear without exudates or hemorrhage Neck: Supple, no carotid bruits. Pulmonary: Clear to auscultation bilaterally   NEUROLOGICAL EXAM:  MENTAL STATUS: Speech/cognivition:  Awake, alert, weak voice, mild shortness of breath with prolonged talking   CRANIAL NERVES: CN II: Visual fields are full to confrontation. Fundoscopic exam is normal with sharp discs and no vascular changes. Pupils are round equal and briskly reactive to light. CN III, IV, VI: extraocular movement are normal.  End gaze rotatory nystagmus CN V: Facial sensation is intact to  pinprick in all 3 divisions bilaterally. Corneal responses are intact.  CN VII: Face is symmetric with normal eye closure and smile. CN VIII: Hearing is normal to rubbing fingers CN IX, X: Palate elevates symmetrically. Phonation is normal. CN XI: Head turning and shoulder shrug are intact CN XII: Tongue is midline with normal movements and no atrophy.  MOTOR: Wheelchair dependent, spastic quadriplegia, no antigravity movement of bilateral shoulder muscles, able to flex bilateral elbow against gravity, trace movement of bilateral fingers, mild to moderate spasticity of bilateral lower extremity muscles, no significant movement  REFLEXES: Hyperreflexia of bilateral upper and lower extremities, sustained clonus, she also has bilateral hip flexion, when changing position  SENSORY: Intact to light touch   COORDINATION: Could not perform GAIT/STANCE: Wheelchair-bound    DIAGNOSTIC DATA (LABS, IMAGING, TESTING) - I reviewed patient records, labs, notes, testing and imaging myself where available.  Lab Results  Component Value Date   WBC 4.1 09/26/2017   HGB 15.1 (H) 09/26/2017   HCT 43.6 09/26/2017   MCV 85.8 09/26/2017   PLT 162 09/26/2017      Component Value Date/Time   NA 139 09/26/2017 1500   K 3.8 09/26/2017 1500   CL 105 09/26/2017 1500   CO2 26 09/26/2017 1500   GLUCOSE 103 (H) 09/26/2017 1500   BUN 10 09/26/2017 1500   CREATININE <0.30 (L) 09/26/2017 1500   CALCIUM 9.3 09/26/2017 1500   PROT 8.0 09/26/2017 1500   ALBUMIN  4.0 09/26/2017 1500   AST 34 09/26/2017 1500   ALT 31 09/26/2017 1500   ALKPHOS 79 09/26/2017 1500   BILITOT 0.8 09/26/2017 1500   GFRNONAA NOT CALCULATED 09/26/2017 1500   GFRAA NOT CALCULATED 09/26/2017 1500    ASSESSMENT AND PLAN  32 y.o. year old female  Spastic quadriplegia, wheelchair dependent since her brainstem stroke at age 26   EMG guided xeomin injection for spastic quadriplegia  We used 400 units of Botox today under the  electric stimulation  Right biceps femoris long head 100 units, Right semimembranosus 50 units, Right semitendinous 50 units   Left biceps femoris long head 100 units, Left semimembranosus 50 units, Left semitendinous 50 units    Levert Feinstein, M.D. Ph.D.  [facility] [address] [place], [place] [address] Phone: [phone] Fax:      [phone]

## 2018-04-11 NOTE — Progress Notes (Signed)
**  Botox 100 units x 4 vials, NDC 5366-4403-47, Lot [id number], Exp 08/2020, specialty pharmacy.//mck,rn**

## 2018-06-13 NOTE — Telephone Encounter (Signed)
Close file

## 2018-07-04 ENCOUNTER — Telehealth: Payer: Self-pay | Admitting: Neurology

## 2018-07-04 NOTE — Telephone Encounter (Signed)
Please call treat patient to check on her response to Botox injection,  We used 400 units of xeomin  Daniel: What is the maximum dose allowed.

## 2018-07-04 NOTE — Telephone Encounter (Signed)
Pt requesting a call to r./s her botox, would also like to know if she can increase the units of botox she gets.

## 2018-07-08 ENCOUNTER — Other Ambulatory Visit: Payer: Self-pay | Admitting: Allergy and Immunology

## 2018-07-08 DIAGNOSIS — H1013 Acute atopic conjunctivitis, bilateral: Secondary | ICD-10-CM

## 2018-07-08 NOTE — Telephone Encounter (Signed)
Get maximum allowed xeomin for next injection

## 2018-07-08 NOTE — Telephone Encounter (Signed)
Pt called she c/a 8/21. Her RN is on vacation, she does not have anyone to help her. She is wanting to r/s. Please call to advise of the 1st available.

## 2018-07-08 NOTE — Telephone Encounter (Signed)
Spoke to patient - stated she wanted to wait until September to reschedule.  She accepted an appt on 08/01/18 at 1pm (arrival time 12:45 for check-in).  She is aware that Dr. Terrace ArabiaYan is going to evaluate her that day for a possible increase in Botox.

## 2018-07-08 NOTE — Telephone Encounter (Signed)
Noted, thank you

## 2018-07-09 ENCOUNTER — Telehealth: Payer: Self-pay | Admitting: *Deleted

## 2018-07-09 NOTE — Telephone Encounter (Signed)
Called [facility], spoke with Deanna to initiate PA for Dantrolene. Clinical questions answered, 24 hour turnaround for decision.  Patient unable to take other drugs in class due to: neurogenic bladder with supra pubic catheter and frequent catheterizations, frequent UTIs, chronic constipation, tracheostomy, depression, headaches, dizziness.  PA #[id number] Ref call # I- [id number]

## 2018-07-10 ENCOUNTER — Ambulatory Visit: Payer: Self-pay | Admitting: Neurology

## 2018-07-15 NOTE — Telephone Encounter (Signed)
Per [facility] website, Dantrolene approved x 1 year.

## 2018-08-01 ENCOUNTER — Ambulatory Visit: Payer: Medicaid Other | Admitting: Neurology

## 2018-08-01 ENCOUNTER — Encounter: Payer: Self-pay | Admitting: Neurology

## 2018-08-01 VITALS — BP 114/73 | HR 91

## 2018-08-01 DIAGNOSIS — G8250 Quadriplegia, unspecified: Secondary | ICD-10-CM | POA: Diagnosis not present

## 2018-08-01 NOTE — Progress Notes (Signed)
**  Botox 100 units x 4 vials, NDC [id number], Lot [id number], Exp 12/2020, specialty pharmacy.//mck,rn**

## 2018-08-01 NOTE — Progress Notes (Signed)
[facility]   HISTORICAL  CHIEF COMPLAINT:  Chief Complaint  Patient presents with  . 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  She is accompanied by her boyfriend, referred by my colleague Dr. Marjory Lies for EMG guided botulinum toxin injection for spastic quadriplegia.  She suffered catastrophic brainstem stroke, cryptogenic at age 42, required intubation, ventilation support, ICU stay, extensive rehabilitation, she regained marked improvement, but become wheelchair-bound with significant quadriplegia,  She also has tracheostomy due to weak cough, she will use ventilator at nighttime, require   suctioning through her tracheostomy.  She has bowel suppositories every other day, was regular bowel regimen, she had a suprapubic catheter.  She lives at home, has caregiver 90 hours each week, her boyfriend moved in with her since 2018, she need total assistance, feeding, dressing.  But she can operate her wheelchair using a mouthpiece, enjoying her computer work and reading.   Previously she got Botox injection through rehabilitation Dr. Riley Kill, most recent injection was in April 2018, for spastic bilateral lower extremity, use the Botox a.  She complains of bilateral lower extremity with sudden positional change, jackknife contraction of bilateral thigh muscle, uncontrollable muscle jumpy movement.  She does use her upper extremity to gesture when she talks.   I was able to review urology record in October 2018 from [facility], neurogenic bladder, history of augmentation septoplasty, and catheterizable stoma,  MRI of the brain in May 2017, abnormal configuration of the medulla that is related to her previous history of stroke, there is prominence of the CSF space adjacent  to the frontal lobe likely represent mild atrophy, no acute abnormality.  UPDATE Apr 11 2018: She is accompanied by her boyfriend at today's clinical visit, she did not respond to previous Botox injection on January 11, 2018, when BOTOX was delivered to bilateral adductor and hip flexor.  I reviewed multiple previous injection note by Dr. Riley Kill, injection was placed at bilateral hamstring muscles, patient reported she responded better to that injection pattern,  UPDATE Sept 12 2019: She is accompanied by her home nurse at today's clinical visit, her lower extremity spasticity are mainly forceful bilateral lower extremity extension, intermixed with bilateral hip flexion, lower extremity muscle clonus, bilateral ankle plantarflexion  REVIEW OF SYSTEMS: Full 14 system review of systems performed and negative except: dizziness headache chills nausea.  ALLERGIES: Allergies  Allergen Reactions  . Bee Venom Shortness Of Breath and Swelling    SHOCK SHOCK  . Amoxicillin Hives and Itching    Has patient had a PCN reaction causing immediate rash, facial/tongue/throat swelling, SOB or lightheadedness with hypotension: no Has patient had a PCN reaction causing severe rash involving mucus membranes or skin necrosis: no Has patient had a PCN reaction that required hospitalization: no Has patient had a PCN reaction occurring within the last 10 years: unknown If all of the above answers are "NO", then may proceed with Cephalosporin use.   . Augmentin [Amoxicillin-Pot Clavulanate] Hives and Itching  . Ciprofloxacin Hives and Rash  . Tobramycin Hives and Rash  . Erythromycin Other (See Comments)    UNSPECIFIED REACTION  UNSPECIFIED REACTION   . Trazodone Other (See Comments)    UNSPECIFIED REACTION  Eyes shake  . Trazodone And Nefazodone     UNSPECIFIED REACTION   .  Morphine And Related Other (See Comments)    "GOES CRAZY" "GOES CRAZY"    HOME MEDICATIONS: Outpatient Medications Prior to  Visit  Medication Sig Dispense Refill  . baclofen (LIORESAL) 20 MG tablet Take 2 tablets (40 mg total) by mouth 4 (four) times daily. 240 each 12  . bisacodyl (DULCOLAX) 10 MG suppository Place 10 mg rectally every other day as needed. constipation    . bismuth subsalicylate (PEPTO BISMOL) 262 MG/15ML suspension Take 30 mLs by mouth every 6 (six) hours as needed.    . dantrolene (DANTRIUM) 100 MG capsule Take 1 capsule (100 mg total) by mouth 3 (three) times daily. TAKE 1 CAPSULE(100 MG) BY MOUTH THREE TIMES DAILY 90 capsule 12  . Dextromethorphan HBr (VICKS DAYQUIL COUGH) 15 MG/15ML LIQD Take 30 mLs every 6 (six) hours as needed by mouth (cough).    . EPIPEN 2-PAK 0.3 MG/0.3ML SOAJ injection USE UTD FOR ALLERGIC REACTION  1  . fluticasone (FLONASE) 50 MCG/ACT nasal spray INSERT 2 SPRAYS IN EACH NOSTRIL EVERY DAY FOR STUFFY NOSE OR DRAINAGE 16 g 5  . guaiFENesin (MUCINEX) 600 MG 12 hr tablet Take 600 mg by mouth 2 (two) times daily as needed (1-2 tab BID PRN).    . hydrocortisone cream 0.5 % Apply 1 application topically 2 (two) times daily. To hairline and brows    . imipramine (TOFRANIL) 50 MG tablet TAKE 2 TABLETS(100 MG) BY MOUTH AT BEDTIME 60 tablet 11  . ketoconazole (NIZORAL) 2 % shampoo Apply 1 application topically 2 (two) times a week.    . levocetirizine (XYZAL) 5 MG tablet TAKE 1 TABLET BY MOUTH EVERY DAY AS NEEDED. NEEDS OFFICE VISIT FOR FURTHER REFILLS 30 tablet 11  . loperamide (IMODIUM A-D) 2 MG tablet Take 4-8 mg by mouth daily as needed for diarrhea or loose stools (not to exceed 4 tablets in 24 hours).    . medroxyPROGESTERone (DEPO-PROVERA) 150 MG/ML injection INJ 1 ML IM Q 13 WEEKS  3  . Methen-Hyosc-Meth Blue-Na Phos (UROGESIC-BLUE) 81.6 MG TABS Take 1 tablet by mouth 4 (four) times daily as needed (pain).     . Multiple Vitamin (MULTIVITAMIN) tablet Take 1 tablet by mouth daily.    . mupirocin ointment (BACTROBAN) 2 % 1 application by Tracheal Tube route daily. Applies to  trach-stoma site    . neomycin-polymyxin B (NEOSPORIN GU IRRIGANT) SOLN 30-40 mLs by GU irrigant route at bedtime. Instilled thru I&O catheter at bedtime-to prevent uti    . nitrofurantoin (MACRODANTIN) 50 MG capsule Take 50 mg by mouth at bedtime.    Marland Kitchen nystatin (MYCOSTATIN) powder Apply topically 2 (two) times daily as needed. Small amount    . OnabotulinumtoxinA, Cosmetic, (BOTOX COSMETIC IM) Inject into the muscle every 3 (three) months.    . ondansetron (ZOFRAN) 4 MG tablet Take 1 tablet (4 mg total) every 6 (six) hours by mouth. 12 tablet 0  . PAZEO 0.7 % SOLN INSTILL 1 DROP INTO BOTH EYES EVERY DAY 2.5 mL 0  . phenazopyridine (PYRIDIUM) 95 MG tablet Take 95 mg by mouth 3 (three) times daily as needed for pain.    Marland Kitchen Phenyleph-Doxylamine-DM-APAP (NYQUIL SEVERE COLD/FLU) 5-6.25-10-325 MG/15ML LIQD Take 30 mLs every 8 (eight) hours as needed by mouth (cold).    . polyethylene glycol (MIRALAX / GLYCOLAX) packet Take 17 g by mouth daily.    . sertraline (ZOLOFT) 100 MG tablet Take 100 mg by mouth at bedtime.    . solifenacin (VESICARE) 10 MG tablet Take 10  mg by mouth daily. Every am    . sucralfate (CARAFATE) 1 GM/10ML suspension Take 1 g by mouth 3 (three) times daily.     Marland Kitchen topiramate (TOPAMAX) 50 MG tablet Take 1 tablet (50 mg total) by mouth at bedtime. TAKE 1 TO 2 TABLETS(25 TO 50 MG) BY MOUTH AT BEDTIME (Patient taking differently: Take 150 mg at bedtime by mouth. ) 30 tablet 12  . HYDROcodone-acetaminophen (NORCO/VICODIN) 5-325 MG tablet Take 1 tablet every 4 (four) hours as needed by mouth. 8 tablet 0  . olopatadine (PATANOL) 0.1 % ophthalmic solution INSTILL 1 DROP INTO BOTH EYES TWICE DAILY 5 mL 5   No facility-administered medications prior to visit.     PAST MEDICAL HISTORY: Past Medical History:  Diagnosis Date  . Anxiety   . CVA (cerebral infarction)    Ischemic brainstem stroke in 2011 at age 32 due to unknown reasons--quadriplegic after stroke.  . Depression    Zoloft has  helped-not having any current depression  . Edema extremities    Feet and legs-mostly in feet  . Headache(784.0)    Prior to imipramine-none currently  . Pneumonia   . Quadriplegia (HCC)    Secondary to stroke at age 54-has trach-lives with boyfriend-motorized w/c-joystick to chin to drive w/c--I & O cath thru suprapubic stoma - has nursing care 16 hrs daily and boyfriend able to help pt the other hours of day.  . Recurrent UTI (urinary tract infection)    Due to suprapubic stoma, repetitive catheterizations  . Sleep apnea    On ventilator at night -has oxygen but rarely needs unless she has a cold and sats drop  . Stroke [facility])     PAST SURGICAL HISTORY: Past Surgical History:  Procedure Laterality Date  . BLADDER SURGERY  2009,2012  . BRONCHOSCOPY  2006  . CHOLECYSTECTOMY  11/18/2012   Procedure: LAPAROSCOPIC CHOLECYSTECTOMY;  Surgeon: Axel Filler, MD;  Location: [facility]S;  Service: General;  Laterality: N/A;  . ESOPHAGOGASTRODUODENOSCOPY    . GASTROSTOMY TUBE PLACEMENT  2000  . ILEOSTOMY  2006  . MICROLARYNGOSCOPY WITH LASER AND BALLOON DILATION N/A 09/27/2016   Procedure: MICROLARYNGOSCOPY WITH LASER AND BALLOON DILATION;  Surgeon: Christia Reading, MD;  Location: [facility] OR;  Service: ENT;  Laterality: N/A;  micro direct laryngoscopy with co2 laser balloon dilation of trachea  . REMOVAL OF GASTROSTOMY TUBE    . SPINE SURGERY  2004,2011  . TENDON REPAIR  2012   Left wrist  . THROAT SURGERY  2004  . TRACHEOSTOMY  2000  . wirst surgery Left   . WISDOM TOOTH EXTRACTION  2008    FAMILY HISTORY: Family History  Problem Relation Age of Onset  . Diabetes Mother   . Hypertension Mother   . Varicose Veins Mother   . Cancer Maternal Aunt        lung  . Diabetes Maternal Grandfather   . Heart disease Maternal Grandfather   . Heart disease Paternal Grandfather   . Other Sister        overdose    SOCIAL HISTORY:  Social History   Socioeconomic History  . Marital status: Single      Spouse name: Not on file  . Number of children: Not on file  . Years of education: college   . Highest education level: Not on file  Occupational History  . Occupation: disabled  Social Needs  . Financial resource strain: Not on file  . Food insecurity:    Worry: Not on file  Inability: Not on file  . Transportation needs:    Medical: Not on file    Non-medical: Not on file  Tobacco Use  . Smoking status: Never Smoker  . Smokeless tobacco: Never Used  Substance and Sexual Activity  . Alcohol use: No    Alcohol/week: 0.0 standard drinks  . Drug use: No    Types: Marijuana  . Sexual activity: Yes    Birth control/protection: None  Lifestyle  . Physical activity:    Days per week: Not on file    Minutes per session: Not on file  . Stress: Not on file  Relationships  . Social connections:    Talks on phone: Not on file    Gets together: Not on file    Attends religious service: Not on file    Active member of club or organization: Not on file    Attends meetings of clubs or organizations: Not on file    Relationship status: Not on file  . Intimate partner violence:    Fear of current or ex partner: Not on file    Emotionally abused: Not on file    Physically abused: Not on file    Forced sexual activity: Not on file  Other Topics Concern  . Not on file  Social History Narrative   08/29/17 Lives at home with boyfriend, Has a RN Sharyl Nimrod that helps to care for her    Drinks no caffeine      PHYSICAL EXAM  Vitals:   08/01/18 1256  BP: 114/73  Pulse: 91    PHYSICAL EXAMNIATION:  Gen: NAD, conversant, well nourised, obese, well groomed                     Cardiovascular: Regular rate rhythm, no peripheral edema, warm, nontender. Eyes: Conjunctivae clear without exudates or hemorrhage Neck: Supple, no carotid bruits. Pulmonary: Clear to auscultation bilaterally   NEUROLOGICAL EXAM:  MENTAL STATUS: Speech/cognivition:  Awake, alert, weak voice, mild  shortness of breath with prolonged talking   CRANIAL NERVES: CN II: Visual fields are full to confrontation. Fundoscopic exam is normal with sharp discs and no vascular changes. Pupils are round equal and briskly reactive to light. CN III, IV, VI: extraocular movement are normal.  End gaze rotatory nystagmus CN V: Facial sensation is intact to pinprick in all 3 divisions bilaterally. Corneal responses are intact.  CN VII: Face is symmetric with normal eye closure and smile. CN VIII: Hearing is normal to rubbing fingers CN IX, X: Palate elevates symmetrically. Phonation is normal. CN XI: Head turning and shoulder shrug are intact CN XII: Tongue is midline with normal movements and no atrophy.  MOTOR: Wheelchair dependent, spastic quadriplegia, no antigravity movement of bilateral shoulder muscles, able to flex bilateral elbow against gravity, trace movement of bilateral fingers, mild to moderate spasticity of bilateral lower extremity muscles, no significant movement  REFLEXES: Hyperreflexia of bilateral upper and lower extremities, sustained clonus, she also has bilateral hip flexion, when changing position  SENSORY: Intact to light touch   COORDINATION: Could not perform GAIT/STANCE: Wheelchair-bound    DIAGNOSTIC DATA (LABS, IMAGING, TESTING) - I reviewed patient records, labs, notes, testing and imaging myself where available.  Lab Results  Component Value Date   WBC 4.1 09/26/2017   HGB 15.1 (H) 09/26/2017   HCT 43.6 09/26/2017   MCV 85.8 09/26/2017   PLT 162 09/26/2017      Component Value Date/Time   NA 139 09/26/2017 1500  K 3.8 09/26/2017 1500   CL 105 09/26/2017 1500   CO2 26 09/26/2017 1500   GLUCOSE 103 (H) 09/26/2017 1500   BUN 10 09/26/2017 1500   CREATININE <0.30 (L) 09/26/2017 1500   CALCIUM 9.3 09/26/2017 1500   PROT 8.0 09/26/2017 1500   ALBUMIN 4.0 09/26/2017 1500   AST 34 09/26/2017 1500   ALT 31 09/26/2017 1500   ALKPHOS 79 09/26/2017 1500    BILITOT 0.8 09/26/2017 1500   GFRNONAA NOT CALCULATED 09/26/2017 1500   GFRAA NOT CALCULATED 09/26/2017 1500    ASSESSMENT AND PLAN  32 y.o. year old female  Spastic quadriplegia, wheelchair dependent since her brainstem stroke at age 32   EMG guided xeomin injection for spastic quadriplegia  We used 400 units of Botox today under the electric stimulation her lower extremity spasticity are mainly forceful bilateral lower extremity extension, intermixed with bilateral hip flexion, lower extremity muscle clonus, bilateral ankle plantarflexion  Right iliopsoas muscle 125 units Right adductor longus 50 units Right tibialis posterior 25 units  Left iliopsoas muscle 125 units Left adductor longus 50 units, Left tibialis posterior 25 units   Levert FeinsteinYijun Nikolette Reindl, M.D. Ph.D.  [facility] [address] [place], [place] [address] Phone: [phone] Fax:      [phone]

## 2018-08-12 ENCOUNTER — Other Ambulatory Visit: Payer: Self-pay | Admitting: Allergy and Immunology

## 2018-08-12 DIAGNOSIS — J3089 Other allergic rhinitis: Secondary | ICD-10-CM

## 2018-08-19 ENCOUNTER — Other Ambulatory Visit: Payer: Self-pay | Admitting: Allergy and Immunology

## 2018-08-19 DIAGNOSIS — H1013 Acute atopic conjunctivitis, bilateral: Secondary | ICD-10-CM

## 2018-08-28 ENCOUNTER — Ambulatory Visit: Payer: Self-pay | Admitting: Pulmonary Disease

## 2018-09-09 ENCOUNTER — Other Ambulatory Visit: Payer: Self-pay | Admitting: Diagnostic Neuroimaging

## 2018-09-09 ENCOUNTER — Other Ambulatory Visit: Payer: Self-pay | Admitting: Allergy and Immunology

## 2018-09-09 DIAGNOSIS — H1013 Acute atopic conjunctivitis, bilateral: Secondary | ICD-10-CM

## 2018-09-09 NOTE — Telephone Encounter (Signed)
Refilled x 3 months with note to pharmacy re: future refills with Dr Terrace Arabia. Patient's care was transferred to Dr Terrace Arabia.

## 2018-09-20 ENCOUNTER — Ambulatory Visit: Payer: Medicaid Other | Admitting: Pulmonary Disease

## 2018-09-20 ENCOUNTER — Encounter: Payer: Self-pay | Admitting: Pulmonary Disease

## 2018-09-20 VITALS — BP 104/70 | HR 92

## 2018-09-20 DIAGNOSIS — J9611 Chronic respiratory failure with hypoxia: Secondary | ICD-10-CM | POA: Diagnosis not present

## 2018-09-20 DIAGNOSIS — Z8673 Personal history of transient ischemic attack (TIA), and cerebral infarction without residual deficits: Secondary | ICD-10-CM

## 2018-09-20 DIAGNOSIS — Z930 Tracheostomy status: Secondary | ICD-10-CM

## 2018-09-20 DIAGNOSIS — Z23 Encounter for immunization: Secondary | ICD-10-CM | POA: Diagnosis not present

## 2018-09-20 NOTE — Progress Notes (Signed)
[facility]  Chief Complaint  Patient presents with  . Follow-up    Pt sats have dropped more at night than normal. Pt is requesting flu shot today.    Constitutional:  BP 104/70 (BP Location: Left Arm, Cuff Size: Normal)   Pulse 92   SpO2 100%   Past Medical History:  CVA with brain stem involvement, Quadriplegia, Depression, Neurogenic bladder, Recurrent UTI, GERD, Scoliosis  Brief Summary:  Veronica Huff is a 32 y.o. female with chronic respiratory failure s/p tracheostomy with nocturnal ventilatory support.  She is here with her caregiver.  Noted to have lower oxygen levels at night, and when she gets repositioned.  Not having cough, sputum, or chest congestion.  Feels okay during the day otherwise.  Uses Astral home vent >> SIMV rate 7, Vt 350, PS 10, PEEP 5, I time 1, High pressure alarm 45.  Physical Exam:   Appearance - in electric wheelchair  ENMT - clear nasal mucosa, midline nasal  septum, no oral exudates, no LAN, tracheostomy site clean  Respiratory - decreased BS, no wheeze/rales  CV - s1s2 regular rate and rhythm, no murmurs, 1+ ankle edema  GI - soft, non tender, no masses  Lymph - no adenopathy noted in neck and axillary areas  MSK - uses an electric wheelchair  Ext - no cyanosis, clubbing, or joint inflammation noted  Skin - no rashes, lesions, or ulcers  Neuro - alert, quadriplegic  Psych - normal mood and affect  Labs 09/26/17 >> Hb 15.1, CO2 26  Discussion:  32 yo female with chronic respiratory failure and sleep disordered breathing 2nd to brain stem infarct at age 20 with resulting quadriplegia.  Assessment/Plan:   Sleep disordered breathing with hx of obstructive sleep apnea. Chronic hypoxic respiratory failure. - noted to have oxygen desaturation intermittently at night and when getting repositioned - will change PEEP to 8 cm H2O - caregiver will call in 2 weeks if she is still having  oxygen destuation  Tracheostomy status. - she is followed by Dr. Jenne Pane with ENT  Allergic rhinitis. - followed by Dr. Nunzio Cobbs with allergy/immunology  Cryptogenic stroke at at 52 with spastic quadriplegia. - followed at [facility]  Neurogenic bladder. - followed by Urology at [facility]  Vaccinations. - prevnar and influenza shots today   Patient Instructions  Prevnar and Influenza vaccines today  Follow up in 1 year    Coralyn Helling, MD [facility] Pager: [phone] 09/20/2018, 12:53 PM  Flow Sheet    Sleep tests:  ONO with home vent 10/12/16 >> test time 3 hrs 51 min.  Mean SpO2 92.9%, low SpO2 74%.  Spent 14 min with SpO2 < 88%.   Medications:   Allergies as of 09/20/2018      Reactions   Bee Venom Shortness Of Breath, Swelling   SHOCK SHOCK   Amoxicillin Hives, Itching   Has patient had a PCN reaction causing immediate rash, facial/tongue/throat swelling, SOB or lightheadedness with hypotension: no Has patient had a PCN reaction causing severe rash involving mucus membranes or skin necrosis: no Has patient had a PCN reaction that required hospitalization: no Has patient had a PCN reaction occurring within the last 10 years: unknown If all of the above answers are "NO", then may proceed with Cephalosporin use.   Augmentin [amoxicillin-pot Clavulanate] Hives, Itching   Ciprofloxacin Hives, Rash   Tobramycin Hives, Rash   Erythromycin Other (See Comments)   UNSPECIFIED REACTION  UNSPECIFIED REACTION  Trazodone Other (See Comments)   UNSPECIFIED REACTION  Eyes shake   Trazodone And Nefazodone    UNSPECIFIED REACTION    Morphine And Related Other (See Comments)   "GOES CRAZY" "GOES CRAZY"      Medication List        Accurate as of 09/20/18 12:53 PM. Always use your most recent med list.          baclofen 20 MG tablet Commonly known as:  LIORESAL TAKE 2 TABLETS(40 MG) BY MOUTH FOUR TIMES DAILY   bisacodyl 10 MG  suppository Commonly known as:  DULCOLAX Place 10 mg rectally every other day as needed. constipation   bismuth subsalicylate 262 MG/15ML suspension Commonly known as:  PEPTO BISMOL Take 30 mLs by mouth every 6 (six) hours as needed.   BOTOX COSMETIC IM Inject into the muscle every 3 (three) months.   dantrolene 100 MG capsule Commonly known as:  DANTRIUM TAKE 1 CAPSULE(100 MG) BY MOUTH THREE TIMES DAILY   EPIPEN 2-PAK 0.3 mg/0.3 mL Soaj injection Generic drug:  EPINEPHrine USE UTD FOR ALLERGIC REACTION   fluticasone 50 MCG/ACT nasal spray Commonly known as:  FLONASE INSERT 2 SPRAYS IN EACH NOSTRIL EVERY DAY FOR STUFFY NOSE OR DRAINAGE   guaiFENesin 600 MG 12 hr tablet Commonly known as:  MUCINEX Take 600 mg by mouth 2 (two) times daily as needed (1-2 tab BID PRN).   hydrocortisone cream 0.5 % Apply 1 application topically 2 (two) times daily. To hairline and brows   imipramine 50 MG tablet Commonly known as:  TOFRANIL TAKE 2 TABLETS(100 MG) BY MOUTH AT BEDTIME   ketoconazole 2 % shampoo Commonly known as:  NIZORAL Apply 1 application topically 2 (two) times a week.   levocetirizine 5 MG tablet Commonly known as:  XYZAL TAKE 1 TABLET BY MOUTH EVERY DAY AS NEEDED. NEEDS OFFICE VISIT FOR FURTHER REFILLS   loperamide 2 MG tablet Commonly known as:  IMODIUM A-D Take 4-8 mg by mouth daily as needed for diarrhea or loose stools (not to exceed 4 tablets in 24 hours).   medroxyPROGESTERone 150 MG/ML injection Commonly known as:  DEPO-PROVERA INJ 1 ML IM Q 13 WEEKS   multivitamin tablet Take 1 tablet by mouth daily.   mupirocin ointment 2 % Commonly known as:  BACTROBAN 1 application by Tracheal Tube route daily. Applies to trach-stoma site   NEOSPORIN GU IRRIGANT Soln Generic drug:  neomycin-polymyxin B 30-40 mLs by GU irrigant route at bedtime. Instilled thru I&O catheter at bedtime-to prevent uti   nitrofurantoin 50 MG capsule Commonly known as:   MACRODANTIN Take 50 mg by mouth at bedtime.   NYQUIL SEVERE COLD/FLU 5-6.25-10-325 MG/15ML Liqd Generic drug:  Phenyleph-Doxylamine-DM-APAP Take 30 mLs every 8 (eight) hours as needed by mouth (cold).   nystatin powder Commonly known as:  MYCOSTATIN/NYSTOP Apply topically 2 (two) times daily as needed. Small amount   ondansetron 4 MG tablet Commonly known as:  ZOFRAN Take 1 tablet (4 mg total) every 6 (six) hours by mouth.   PAZEO 0.7 % Soln Generic drug:  Olopatadine HCl INSTILL 1 DROP INTO BOTH EYES EVERY DAY   phenazopyridine 95 MG tablet Commonly known as:  PYRIDIUM Take 95 mg by mouth 3 (three) times daily as needed for pain.   polyethylene glycol packet Commonly known as:  MIRALAX / GLYCOLAX Take 17 g by mouth daily.   sertraline 100 MG tablet Commonly known as:  ZOLOFT Take 100 mg by mouth at bedtime.   solifenacin  10 MG tablet Commonly known as:  VESICARE Take 10 mg by mouth daily. Every am   sucralfate 1 GM/10ML suspension Commonly known as:  CARAFATE Take 1 g by mouth 3 (three) times daily.   topiramate 50 MG tablet Commonly known as:  TOPAMAX Take 1 tablet (50 mg total) by mouth at bedtime. TAKE 1 TO 2 TABLETS(25 TO 50 MG) BY MOUTH AT BEDTIME   UROGESIC-BLUE 81.6 MG Tabs Take 1 tablet by mouth 4 (four) times daily as needed (pain).   VICKS DAYQUIL COUGH 15 MG/15ML Liqd Generic drug:  Dextromethorphan HBr Take 30 mLs every 6 (six) hours as needed by mouth (cough).       Past Surgical History:  She  has a past surgical history that includes Ileostomy (2006); Esophagogastroduodenoscopy; Spine surgery (1610,9604); Tracheostomy (2000); Gastrostomy tube placement (2000); Bronchoscopy (2006); Throat surgery (2004); Wisdom tooth extraction (2008); Bladder surgery (5409,8119); Tendon repair (2012); Removal of gastrostomy tube; Cholecystectomy (11/18/2012); wirst surgery (Left); and Microlaryngoscopy with laser and balloon dilation (N/A, 09/27/2016).  Family  History:  Her family history includes Cancer in her maternal aunt; Diabetes in her maternal grandfather and mother; Heart disease in her maternal grandfather and paternal grandfather; Hypertension in her mother; Other in her sister; Varicose Veins in her mother.  Social History:  She  reports that she has never smoked. She has never used smokeless tobacco. She reports that she does not drink alcohol or use drugs.

## 2018-09-20 NOTE — Patient Instructions (Signed)
Prevnar and Influenza vaccines today  Follow up in 1 year

## 2018-09-23 ENCOUNTER — Other Ambulatory Visit: Payer: Self-pay | Admitting: Diagnostic Neuroimaging

## 2018-09-23 DIAGNOSIS — G8250 Quadriplegia, unspecified: Secondary | ICD-10-CM

## 2018-09-23 DIAGNOSIS — R519 Headache, unspecified: Secondary | ICD-10-CM

## 2018-09-23 DIAGNOSIS — R51 Headache: Secondary | ICD-10-CM

## 2018-10-03 ENCOUNTER — Telehealth: Payer: Self-pay | Admitting: Neurology

## 2018-10-03 ENCOUNTER — Telehealth: Payer: Self-pay | Admitting: Pulmonary Disease

## 2018-10-03 NOTE — Telephone Encounter (Signed)
Spoke with the pt  She states her PEEP was increased from 5-8 and her sats have been much better  She is feeling well and not having any problems  FYI for Dr Craige CottaSood, thanks

## 2018-10-03 NOTE — Telephone Encounter (Signed)
Noted  

## 2018-10-03 NOTE — Telephone Encounter (Signed)
Botox letter regarding Specialty Pharm[facility] mailed to patient °

## 2018-10-07 ENCOUNTER — Other Ambulatory Visit: Payer: Self-pay | Admitting: Diagnostic Neuroimaging

## 2018-10-08 NOTE — Telephone Encounter (Signed)
Pt has appt 10/31/2018 with you.  Did you want to refill both of these?

## 2018-10-28 ENCOUNTER — Other Ambulatory Visit: Payer: Self-pay | Admitting: Neurology

## 2018-10-29 ENCOUNTER — Telehealth: Payer: Self-pay | Admitting: Diagnostic Neuroimaging

## 2018-10-29 NOTE — Telephone Encounter (Signed)
I returned the call and offered several appt options to the patient.  She decided that 1pm on 12/12/17 would work best for her.  She will arrive early for check-in.

## 2018-10-29 NOTE — Telephone Encounter (Signed)
Pt is asking for a call to be rescheduled for botox

## 2018-10-29 NOTE — Telephone Encounter (Signed)
Patient called back again stated she just wanted to make sure we had taken her apt off. Veronica Huff, should I just offer her first available or is there something sooner than Feb?

## 2018-10-30 NOTE — Telephone Encounter (Signed)
Thank you Michelle.  

## 2018-10-31 ENCOUNTER — Ambulatory Visit: Payer: Self-pay | Admitting: Neurology

## 2018-11-18 ENCOUNTER — Other Ambulatory Visit: Payer: Self-pay | Admitting: Neurology

## 2018-11-18 DIAGNOSIS — R519 Headache, unspecified: Secondary | ICD-10-CM

## 2018-11-18 DIAGNOSIS — R51 Headache: Principal | ICD-10-CM

## 2018-11-25 ENCOUNTER — Other Ambulatory Visit: Payer: Self-pay | Admitting: Neurology

## 2018-12-02 ENCOUNTER — Other Ambulatory Visit: Payer: Self-pay | Admitting: Neurology

## 2018-12-03 ENCOUNTER — Telehealth: Payer: Self-pay | Admitting: *Deleted

## 2018-12-03 ENCOUNTER — Other Ambulatory Visit: Payer: Self-pay | Admitting: Allergy and Immunology

## 2018-12-03 DIAGNOSIS — H1013 Acute atopic conjunctivitis, bilateral: Secondary | ICD-10-CM

## 2018-12-03 MED ORDER — DANTROLENE SODIUM 100 MG PO CAPS: 100.0000 mg | ORAL_CAPSULE | Freq: Three times a day (TID) | ORAL | 0 refills | Status: DC

## 2018-12-03 NOTE — Telephone Encounter (Signed)
Gave 1 courtesy refill of pazeo, per Dr. Nunzio Cobbs pt is to return 12/2018, pt needs apt.

## 2018-12-03 NOTE — Telephone Encounter (Signed)
Dantrolene rx. escribed to [facility].  LMOM to let Marchelle Folks know this has been done/fim

## 2018-12-03 NOTE — Telephone Encounter (Addendum)
Received refill request for dantrolene 100mg  TID from the pharmacy.  The patient's chart also indicates she is taking baclofen 20mg  two tabs QID.  We need to check with the patient to verify she is taking both muscle relaxers.  Per vo by Dr. Terrace Arabia, okay to provide refills for both medications, if the she is already taking them.

## 2018-12-03 NOTE — Addendum Note (Signed)
Addended by: Candis Schatz I on: 12/03/2018 10:07 AM   Modules accepted: Orders

## 2018-12-10 NOTE — Telephone Encounter (Signed)
Pt requesting a call to r/s her botox appt  °

## 2018-12-11 NOTE — Telephone Encounter (Signed)
I called the patient back but she did not answer. I left a VM asking her to return my call.  °

## 2018-12-11 NOTE — Telephone Encounter (Signed)
Pt has returned the call to Danielle, she is asking for a call back °

## 2018-12-12 ENCOUNTER — Ambulatory Visit: Payer: Self-pay | Admitting: Neurology

## 2018-12-12 NOTE — Telephone Encounter (Signed)
I called and scheduled the patient.  °

## 2018-12-23 ENCOUNTER — Other Ambulatory Visit: Payer: Self-pay | Admitting: Neurology

## 2019-01-06 ENCOUNTER — Other Ambulatory Visit: Payer: Self-pay | Admitting: Neurology

## 2019-01-06 ENCOUNTER — Other Ambulatory Visit: Payer: Self-pay | Admitting: Allergy and Immunology

## 2019-01-06 DIAGNOSIS — H1013 Acute atopic conjunctivitis, bilateral: Secondary | ICD-10-CM

## 2019-01-07 NOTE — Telephone Encounter (Signed)
Courtesy refill with one additional fill until patient can get in to be seen.

## 2019-01-20 ENCOUNTER — Other Ambulatory Visit: Payer: Self-pay | Admitting: Allergy and Immunology

## 2019-01-20 ENCOUNTER — Other Ambulatory Visit: Payer: Self-pay | Admitting: Neurology

## 2019-01-20 DIAGNOSIS — H1013 Acute atopic conjunctivitis, bilateral: Secondary | ICD-10-CM

## 2019-01-20 DIAGNOSIS — J3089 Other allergic rhinitis: Secondary | ICD-10-CM

## 2019-01-30 ENCOUNTER — Telehealth: Payer: Self-pay | Admitting: Neurology

## 2019-01-30 MED ORDER — DANTROLENE SODIUM 100 MG PO CAPS: ORAL_CAPSULE | ORAL | 0 refills | Status: DC

## 2019-01-30 MED ORDER — BACLOFEN 20 MG PO TABS: 40.0000 mg | ORAL_TABLET | Freq: Four times a day (QID) | ORAL | 0 refills | Status: DC

## 2019-01-30 NOTE — Telephone Encounter (Signed)
Pt said it has been awhile since seeing Dr Terrace Arabia. She has botox appt on 3/17 but due to her health status she is not wanting to come to a Dr's office, she is concerned about being exposed to the coronavirus outbreak. Pt states she does need some of her medications refilled. Please call to discuss

## 2019-01-30 NOTE — Telephone Encounter (Signed)
Spoke with Veronica Huff. She is a quadriplegic; is esp. concerned about the coronavirus. Last seen in Sept. 2019 for Botox. She requests to push her appt. back and is asking for  2 mo. r/f of Dantrolene and Baclofen.  Rx's escribed as requested, and pt. will call back to r/s appt. as soon as possible/fim

## 2019-02-04 ENCOUNTER — Ambulatory Visit: Payer: Medicaid Other | Admitting: Neurology

## 2019-02-07 ENCOUNTER — Other Ambulatory Visit: Payer: Self-pay | Admitting: Neurology

## 2019-02-10 ENCOUNTER — Other Ambulatory Visit: Payer: Self-pay | Admitting: Allergy and Immunology

## 2019-02-10 DIAGNOSIS — H1013 Acute atopic conjunctivitis, bilateral: Secondary | ICD-10-CM

## 2019-02-10 DIAGNOSIS — J3089 Other allergic rhinitis: Secondary | ICD-10-CM

## 2019-02-17 ENCOUNTER — Other Ambulatory Visit: Payer: Self-pay | Admitting: Allergy and Immunology

## 2019-02-17 DIAGNOSIS — H1013 Acute atopic conjunctivitis, bilateral: Secondary | ICD-10-CM

## 2019-02-17 DIAGNOSIS — J3089 Other allergic rhinitis: Secondary | ICD-10-CM

## 2019-02-24 ENCOUNTER — Other Ambulatory Visit: Payer: Self-pay | Admitting: Allergy and Immunology

## 2019-02-24 DIAGNOSIS — J3089 Other allergic rhinitis: Secondary | ICD-10-CM

## 2019-03-03 ENCOUNTER — Other Ambulatory Visit: Payer: Self-pay | Admitting: Neurology

## 2019-03-17 ENCOUNTER — Other Ambulatory Visit: Payer: Self-pay | Admitting: Neurology

## 2019-03-24 ENCOUNTER — Other Ambulatory Visit: Payer: Self-pay | Admitting: Allergy and Immunology

## 2019-03-24 ENCOUNTER — Telehealth: Payer: Self-pay

## 2019-03-24 DIAGNOSIS — J3089 Other allergic rhinitis: Secondary | ICD-10-CM

## 2019-03-24 DIAGNOSIS — H1013 Acute atopic conjunctivitis, bilateral: Secondary | ICD-10-CM

## 2019-03-24 NOTE — Telephone Encounter (Signed)
See my chart message

## 2019-04-01 NOTE — Telephone Encounter (Signed)
I called to follow up and see if the patient would like to schedule Botox she did not answer so I left a VM asking him to call back.

## 2019-04-02 NOTE — Telephone Encounter (Signed)
Patient does not want to schedule Botox as of now. Patient will call back when she is ready.

## 2019-04-16 ENCOUNTER — Other Ambulatory Visit: Payer: Self-pay | Admitting: *Deleted

## 2019-04-16 MED ORDER — BACLOFEN 20 MG PO TABS: ORAL_TABLET | ORAL | 1 refills | Status: DC

## 2019-04-23 ENCOUNTER — Other Ambulatory Visit: Payer: Self-pay | Admitting: Allergy and Immunology

## 2019-04-23 DIAGNOSIS — J3089 Other allergic rhinitis: Secondary | ICD-10-CM

## 2019-04-23 DIAGNOSIS — H1013 Acute atopic conjunctivitis, bilateral: Secondary | ICD-10-CM

## 2019-06-05 ENCOUNTER — Other Ambulatory Visit: Payer: Self-pay | Admitting: *Deleted

## 2019-06-05 MED ORDER — BACLOFEN 20 MG PO TABS: ORAL_TABLET | ORAL | 5 refills | Status: DC

## 2019-07-07 ENCOUNTER — Other Ambulatory Visit: Payer: Self-pay | Admitting: *Deleted

## 2019-07-07 MED ORDER — DANTROLENE SODIUM 100 MG PO CAPS: 100.0000 mg | ORAL_CAPSULE | Freq: Three times a day (TID) | ORAL | 5 refills | Status: DC

## 2019-08-04 ENCOUNTER — Telehealth: Payer: Self-pay | Admitting: Lactation Services

## 2019-08-04 NOTE — Telephone Encounter (Signed)
Pt called and left message on Nurse voicemail.   Pt reports she has a Therapist, nutritional and has not been out of the house in 4 months. She reports she has home health come in to care for her. She reports she has left labial pain and left groin skin ulcer.   She is not able to drive and does not want to take Public Transportation at this time.   She would like a virtual visit with Dr. Hulan Fray. Message sent to front office to call pt and schedule.

## 2019-08-22 ENCOUNTER — Telehealth: Payer: Self-pay

## 2019-08-22 NOTE — Telephone Encounter (Signed)
VM left on nurse call line stating her previously reported symptoms are improving. She states she no longer has any bumps or swelling on her labia or pain to the area. She states she plans to follow up with Dr. Hulan Fray at her upcoming appt.   Left message on pt's VM stating we received her message and she can follow up with Dr. Hulan Fray at her appt on 10/9.

## 2019-08-27 ENCOUNTER — Telehealth: Payer: Self-pay

## 2019-08-27 NOTE — Telephone Encounter (Signed)
Pt left message on VM with an update about ongoing symptoms. Pt reports having discomfort to L labia and that she thinks she may be experiencing a cyst. Pt states she plans to follow up with Dr. Hulan Fray at her appt.   Attempted to contact pt; left message on VM stating we received her message.

## 2019-08-29 ENCOUNTER — Other Ambulatory Visit: Payer: Self-pay

## 2019-08-29 ENCOUNTER — Telehealth: Payer: Medicare Other | Admitting: Obstetrics & Gynecology

## 2019-09-01 ENCOUNTER — Other Ambulatory Visit: Payer: Self-pay | Admitting: Diagnostic Neuroimaging

## 2019-09-01 DIAGNOSIS — R519 Headache, unspecified: Secondary | ICD-10-CM

## 2019-09-01 DIAGNOSIS — G8250 Quadriplegia, unspecified: Secondary | ICD-10-CM

## 2019-09-04 ENCOUNTER — Telehealth: Payer: Self-pay | Admitting: *Deleted

## 2019-09-04 ENCOUNTER — Encounter: Payer: Self-pay | Admitting: *Deleted

## 2019-09-04 NOTE — Telephone Encounter (Signed)
Needed to clarify details re: her upcoming appointment. Should be in person if her support person can accompany her and lift her onto exam table .  I called Veronica Huff and left a message we are calling to clarify some details re: your appointment- please call our office back. Will send a Mychart message.  Veronica Giles,RN

## 2019-09-08 ENCOUNTER — Telehealth: Payer: Self-pay | Admitting: *Deleted

## 2019-09-08 NOTE — Telephone Encounter (Signed)
Veronica Huff called this pm and left a message she was returning our call about clarifying some details about her upcoming appointment 10/01/19. I called her and informed her we may not have equipment to transfer from her wheelchair to table and wanted to clarify if she had someone coming with her  She confirmed her boyfriend will be coming with her to help transfer her from wheelchair to table and then back. She states he transfers her at home. I thanked her for calling us back.  Jacques Navy

## 2019-09-10 ENCOUNTER — Other Ambulatory Visit: Payer: Self-pay | Admitting: Diagnostic Neuroimaging

## 2019-09-10 ENCOUNTER — Telehealth: Payer: Self-pay | Admitting: Diagnostic Neuroimaging

## 2019-09-10 DIAGNOSIS — R519 Headache, unspecified: Secondary | ICD-10-CM

## 2019-09-10 DIAGNOSIS — G8250 Quadriplegia, unspecified: Secondary | ICD-10-CM

## 2019-09-10 NOTE — Telephone Encounter (Signed)
Called patient and informed her that we need to schedule a virtual visit with Dr Krista Blue. Then I will refill topamax to get her through until VV. We scheduled and I refilled topamax x 3 months. She verbalized understanding, appreciation.

## 2019-09-10 NOTE — Telephone Encounter (Signed)
Pt called wanting to know if the provider can fill her topiramate (TOPAMAX) 50 MG tablet or do a VV for her because she is not wanting to come in to the office due to the pandemic. Please advise.

## 2019-09-26 ENCOUNTER — Ambulatory Visit: Payer: Medicaid Other | Admitting: Obstetrics & Gynecology

## 2019-10-06 ENCOUNTER — Other Ambulatory Visit: Payer: Self-pay

## 2019-10-06 ENCOUNTER — Encounter: Payer: Self-pay | Admitting: Obstetrics & Gynecology

## 2019-10-06 ENCOUNTER — Ambulatory Visit (INDEPENDENT_AMBULATORY_CARE_PROVIDER_SITE_OTHER): Payer: Medicare Other | Admitting: Obstetrics & Gynecology

## 2019-10-06 ENCOUNTER — Other Ambulatory Visit (HOSPITAL_COMMUNITY)
Admission: RE | Admit: 2019-10-06 | Discharge: 2019-10-06 | Disposition: A | Discharge status: Home / Self Care | Payer: Medicare Other | Source: Ambulatory Visit | Attending: Obstetrics & Gynecology | Admitting: Obstetrics & Gynecology

## 2019-10-06 DIAGNOSIS — Z0000 Encounter for general adult medical examination without abnormal findings: Secondary | ICD-10-CM | POA: Diagnosis not present

## 2019-10-06 DIAGNOSIS — Z23 Encounter for immunization: Secondary | ICD-10-CM

## 2019-10-06 DIAGNOSIS — R87610 Atypical squamous cells of undetermined significance on cytologic smear of cervix (ASC-US): Secondary | ICD-10-CM | POA: Insufficient documentation

## 2019-10-06 DIAGNOSIS — Z1151 Encounter for screening for human papillomavirus (HPV): Secondary | ICD-10-CM | POA: Insufficient documentation

## 2019-10-06 DIAGNOSIS — Z124 Encounter for screening for malignant neoplasm of cervix: Secondary | ICD-10-CM | POA: Insufficient documentation

## 2019-10-06 DIAGNOSIS — R102 Pelvic and perineal pain: Secondary | ICD-10-CM | POA: Diagnosis present

## 2019-10-06 NOTE — Addendum Note (Signed)
Addended by: Emily Filbert on: 10/06/2019 02:21 PM   Modules accepted: Orders

## 2019-10-06 NOTE — Progress Notes (Addendum)
   Subjective:    Patient ID: Veronica Huff, female    DOB: October 19, 1986, 33 y.o.   MRN: [id number]  HPI 33 yo single G0 here today with several months h/o vulvar pain, especially after sex. Sometimes a blister is seen. This will resolve and then return. She has also seen some swelling with drainage at time. 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 She denies the symptoms currently.  Review of Systems She has been monogamous for the last 2 years.    Objective:   Physical Exam Breathing, conversing normally Wheelchair bound Breast exam normal bilaterally EG- normal Bimanual exam- normal size and shape, anteverted, mobile, non-tender, normal adnexal exam     Assessment & Plan:  Preventative care- pap smear done today Vulvar pain with intermittent blisters- I will check HSV2 IgG She can return when the symtoms are present if this test is negative. TDAP today

## 2019-10-07 ENCOUNTER — Telehealth: Payer: Self-pay

## 2019-10-07 LAB — HSV 2 ANTIBODY, IGG: HSV 2 IgG, Type Spec: <0.91 index (ref 0.00–0.90)

## 2019-10-07 NOTE — Telephone Encounter (Addendum)
Pt requesting a referral to another GYN.    LM for pt that I apologize for just returning her call but if she continue to have questions or concerns to please give the office a call back.

## 2019-10-10 LAB — CYTOLOGY - PAP
Chlamydia: NEGATIVE
Comment: NEGATIVE
Comment: NEGATIVE
Comment: NORMAL
Diagnosis: UNDETERMINED — AB
High risk HPV: NEGATIVE
Neisseria Gonorrhea: NEGATIVE

## 2019-10-27 ENCOUNTER — Ambulatory Visit: Payer: Self-pay | Admitting: Neurology

## 2019-11-17 ENCOUNTER — Telehealth: Payer: Self-pay | Admitting: Neurology

## 2019-11-25 ENCOUNTER — Telehealth (INDEPENDENT_AMBULATORY_CARE_PROVIDER_SITE_OTHER): Payer: Medicare Other | Admitting: Neurology

## 2019-11-25 ENCOUNTER — Other Ambulatory Visit: Payer: Self-pay | Admitting: *Deleted

## 2019-11-25 DIAGNOSIS — G8250 Quadriplegia, unspecified: Secondary | ICD-10-CM | POA: Diagnosis not present

## 2019-11-25 MED ORDER — BACLOFEN 20 MG PO TABS: ORAL_TABLET | ORAL | 5 refills | Status: DC

## 2019-11-25 NOTE — Progress Notes (Signed)
[facility]   HISTORICAL  CHIEF COMPLAINT:  No chief complaint on file.   HISTORY OF PRESENT ILLNESS:  Veronica Huff is a 34 year old female, suffered a brainstem stroke at age 50 years old, with residual spastic quadriplegia, neurogenic bladder.  She is accompanied by her boyfriend, referred by my colleague Dr. Marjory Lies for EMG guided botulinum toxin injection for spastic quadriplegia.  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  She has bowel suppositories every other day, was regular bowel regimen, she had a suprapubic catheter.  She lives at home, has caregiver 90 hours each week, her boyfriend moved in with her since 2018, she need total assistance, feeding, dressing.  But she can operate her wheelchair using a mouthpiece, enjoying her computer work and reading.   Previously she got Botox injection through rehabilitation Dr. Riley Kill, most recent injection was in April 2018, for spastic bilateral lower extremity, use the Botox a.  She complains of bilateral lower extremity with sudden positional change, jackknife contraction of bilateral thigh muscle, uncontrollable muscle jumpy movement.  She does use her upper extremity to gesture when she talks.   I was able to review urology record in October 2018 from [facility], neurogenic bladder, history of augmentation septoplasty, and catheterizable stoma,  MRI of the brain in May 2017, abnormal configuration of the medulla that is related to her previous history of stroke, there is prominence of the CSF space adjacent to the frontal lobe likely represent mild atrophy, no acute abnormality.  UPDATE Apr 11 2018: She is accompanied by her boyfriend at today's clinical visit, she did not respond to previous Botox injection on January 11, 2018, when BOTOX was delivered to bilateral adductor and hip flexor.  I reviewed multiple previous injection note by Dr. Riley Kill, injection was placed at bilateral hamstring muscles, patient reported she responded better to that injection pattern,  UPDATE Sept 12 2019: She is accompanied by her home nurse at today's clinical visit, her lower extremity spasticity are mainly forceful bilateral lower extremity extension, intermixed with bilateral hip flexion, lower extremity muscle clonus, bilateral ankle plantarflexion  Virtual Visit via Video on Jan 5th 2021  I connected with Veronica Huff on 11/25/19 at  by Video and verified that I am speaking with the correct person using two identifiers.   I discussed the limitations, risks, security and privacy concerns of performing an evaluation and management service by video and the availability of in person appointments. I also discussed with the patient that there may be a patient responsible charge related to this service. The patient expressed understanding and agreed to proceed.    History of Present Illness: Last injection was in September 2019, she responded well, she does not feel comfortable to continue injection with COVID-19 pandemics,   Observations/Objective: I have reviewed problem lists, medications, allergies.  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 The patient was  provided an opportunity to ask questions and all were answered. The patient agreed with the plan and demonstrated an understanding of the instructions.   The patient was advised to call back or seek an in-person evaluation if the symptoms worsen or if the condition fails to improve as anticipated.  I provided 10 minutes of non-face-to-face time during this encounter.   Marcial Pacas, MD

## 2019-11-26 ENCOUNTER — Other Ambulatory Visit: Payer: Self-pay | Admitting: *Deleted

## 2019-11-26 ENCOUNTER — Other Ambulatory Visit: Payer: Self-pay | Admitting: Neurology

## 2019-11-26 DIAGNOSIS — G8250 Quadriplegia, unspecified: Secondary | ICD-10-CM

## 2019-11-26 DIAGNOSIS — R519 Headache, unspecified: Secondary | ICD-10-CM

## 2019-11-26 MED ORDER — TOPIRAMATE 50 MG PO TABS: 50.0000 mg | ORAL_TABLET | Freq: Every day | ORAL | 2 refills | Status: DC

## 2020-02-10 ENCOUNTER — Telehealth: Payer: Self-pay | Admitting: Obstetrics & Gynecology

## 2020-02-10 NOTE — Telephone Encounter (Signed)
Patient called to get information on a referral to another GYN. Please return her call.

## 2020-03-05 NOTE — Telephone Encounter (Signed)
Left message for pt that if she has chosen a GYN office we are happy to send her records.  If she has not found a location please let us know what area you are looking for so that way she can decide on who she wants to be referred to.  If she could please send a message via MyChart or if call the office.

## 2020-03-12 ENCOUNTER — Other Ambulatory Visit: Payer: Self-pay

## 2020-03-12 ENCOUNTER — Emergency Department (HOSPITAL_COMMUNITY)
Admission: EM | Admit: 2020-03-12 | Discharge: 2020-03-13 | Disposition: A | Discharge status: Home / Self Care | Payer: Medicare Other | Attending: Emergency Medicine | Admitting: Emergency Medicine

## 2020-03-12 DIAGNOSIS — Z79899 Other long term (current) drug therapy: Secondary | ICD-10-CM | POA: Diagnosis not present

## 2020-03-12 DIAGNOSIS — R109 Unspecified abdominal pain: Secondary | ICD-10-CM

## 2020-03-12 DIAGNOSIS — G8250 Quadriplegia, unspecified: Secondary | ICD-10-CM | POA: Insufficient documentation

## 2020-03-12 DIAGNOSIS — K5900 Constipation, unspecified: Secondary | ICD-10-CM | POA: Diagnosis not present

## 2020-03-12 DIAGNOSIS — K5641 Fecal impaction: Secondary | ICD-10-CM | POA: Diagnosis present

## 2020-03-12 DIAGNOSIS — R1084 Generalized abdominal pain: Secondary | ICD-10-CM | POA: Insufficient documentation

## 2020-03-12 MED ORDER — ONDANSETRON 4 MG PO TBDP
4.0000 mg | ORAL_TABLET | Freq: Once | ORAL | Status: AC
  Administered 2020-03-12: 4 mg via ORAL
  Filled 2020-03-12: qty 1

## 2020-03-12 MED ORDER — SODIUM CHLORIDE 0.9 % IV BOLUS: 1000.0000 mL | Freq: Once | INTRAVENOUS | Status: DC

## 2020-03-12 MED ORDER — SODIUM CHLORIDE 0.9 % IV BOLUS (SEPSIS)
1000.0000 mL | Freq: Once | INTRAVENOUS | Status: AC
  Administered 2020-03-12: 1000 mL via INTRAVENOUS

## 2020-03-12 MED ORDER — SODIUM CHLORIDE 0.9 % IV SOLN
1000.0000 mL | INTRAVENOUS | Status: DC
  Administered 2020-03-13: 1000 mL via INTRAVENOUS

## 2020-03-12 MED ORDER — FENTANYL CITRATE (PF) 100 MCG/2ML IJ SOLN
50.0000 ug | Freq: Once | INTRAMUSCULAR | Status: AC
  Administered 2020-03-12: 50 ug via INTRAVENOUS
  Filled 2020-03-12: qty 2

## 2020-03-12 NOTE — ED Triage Notes (Signed)
Per EMS, Pt has not a normal BM for apprx one week. Went to the PCP and advised to come in to be evaluated for SBO. Pt reports tenderness in abd, and nausea.
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
## 2020-03-12 NOTE — ED Notes (Addendum)
PT was provided peri-care, PT was repositioned, placed in position of comfort, PT suctioned of mouth, Suction left at bedside, PT visitor, boyfriend has suction equipment for trach-vent. EMT and RN assisted. PT did not want to wear hospital gown and was comfortable with just a sheet covering.

## 2020-03-13 ENCOUNTER — Emergency Department (HOSPITAL_COMMUNITY): Payer: Medicare Other

## 2020-03-13 ENCOUNTER — Encounter (HOSPITAL_COMMUNITY): Payer: Self-pay

## 2020-03-13 DIAGNOSIS — K5900 Constipation, unspecified: Secondary | ICD-10-CM | POA: Diagnosis not present

## 2020-03-13 LAB — COMPREHENSIVE METABOLIC PANEL
ALT: 16 U/L (ref 0–44)
AST: 18 U/L (ref 15–41)
Albumin: 4.3 g/dL (ref 3.5–5.0)
Alkaline Phosphatase: 61 U/L (ref 38–126)
Anion gap: 12 (ref 5–15)
BUN: 11 mg/dL (ref 6–20)
CO2: 24 mmol/L (ref 22–32)
Calcium: 9.3 mg/dL (ref 8.9–10.3)
Chloride: 102 mmol/L (ref 98–111)
Creatinine, Ser: <0.3 mg/dL — ABNORMAL LOW (ref 0.44–1.00)
Glucose, Bld: 111 mg/dL — ABNORMAL HIGH (ref 70–99)
Potassium: 3.6 mmol/L (ref 3.5–5.1)
Sodium: 138 mmol/L (ref 135–145)
Total Bilirubin: 0.8 mg/dL (ref 0.3–1.2)
Total Protein: 7.7 g/dL (ref 6.5–8.1)

## 2020-03-13 LAB — CBC WITH DIFFERENTIAL/PLATELET
Abs Immature Granulocytes: 0.02 10*3/uL (ref 0.00–0.07)
Basophils Absolute: 0 10*3/uL (ref 0.0–0.1)
Basophils Relative: 0 %
Eosinophils Absolute: 0 10*3/uL (ref 0.0–0.5)
Eosinophils Relative: 0 %
HCT: 45.5 % (ref 36.0–46.0)
Hemoglobin: 15.5 g/dL — ABNORMAL HIGH (ref 12.0–15.0)
Immature Granulocytes: 0 %
Lymphocytes Relative: 15 %
Lymphs Abs: 1.1 10*3/uL (ref 0.7–4.0)
MCH: 29.9 pg (ref 26.0–34.0)
MCHC: 34.1 g/dL (ref 30.0–36.0)
MCV: 87.8 fL (ref 80.0–100.0)
Monocytes Absolute: 0.3 10*3/uL (ref 0.1–1.0)
Monocytes Relative: 5 %
Neutro Abs: 5.5 10*3/uL (ref 1.7–7.7)
Neutrophils Relative %: 80 %
Platelets: 143 10*3/uL — ABNORMAL LOW (ref 150–400)
RBC: 5.18 MIL/uL — ABNORMAL HIGH (ref 3.87–5.11)
RDW: 13.2 % (ref 11.5–15.5)
WBC: 6.9 10*3/uL (ref 4.0–10.5)
nRBC: 0 % (ref 0.0–0.2)

## 2020-03-13 LAB — URINALYSIS, ROUTINE W REFLEX MICROSCOPIC
Bilirubin Urine: NEGATIVE
Glucose, UA: NEGATIVE mg/dL
Ketones, ur: NEGATIVE mg/dL
Nitrite: POSITIVE — AB
Protein, ur: NEGATIVE mg/dL
Specific Gravity, Urine: 1.01 (ref 1.005–1.030)
pH: 6 (ref 5.0–8.0)

## 2020-03-13 LAB — PREGNANCY, URINE: Preg Test, Ur: NEGATIVE

## 2020-03-13 MED ORDER — SODIUM CHLORIDE (PF) 0.9 % IJ SOLN
INTRAMUSCULAR | Status: AC
  Filled 2020-03-13: qty 50

## 2020-03-13 MED ORDER — FENTANYL CITRATE (PF) 100 MCG/2ML IJ SOLN
50.0000 ug | Freq: Once | INTRAMUSCULAR | Status: AC
  Administered 2020-03-13: 05:00:00 50 ug via INTRAVENOUS
  Filled 2020-03-13: qty 2

## 2020-03-13 MED ORDER — IOHEXOL 300 MG/ML  SOLN
100.0000 mL | Freq: Once | INTRAMUSCULAR | Status: AC | PRN
  Administered 2020-03-13: 100 mL via INTRAVENOUS

## 2020-03-13 MED ORDER — POLYETHYLENE GLYCOL 3350 17 GM/SCOOP PO POWD
ORAL | 0 refills | Status: AC
Start: 2020-03-13 — End: ?

## 2020-03-13 MED ORDER — ONDANSETRON HCL 4 MG/2ML IJ SOLN
4.0000 mg | Freq: Once | INTRAMUSCULAR | Status: AC
  Administered 2020-03-13: 4 mg via INTRAVENOUS
  Filled 2020-03-13: qty 2

## 2020-03-13 MED ORDER — METOCLOPRAMIDE HCL 5 MG/ML IJ SOLN
10.0000 mg | Freq: Once | INTRAMUSCULAR | Status: AC
  Administered 2020-03-13: 10 mg via INTRAVENOUS
  Filled 2020-03-13: qty 2

## 2020-03-15 LAB — URINE CULTURE: Culture: >=100000 — AB

## 2020-03-16 ENCOUNTER — Telehealth: Payer: Self-pay | Admitting: Emergency Medicine

## 2020-03-16 NOTE — Telephone Encounter (Signed)
Post ED Visit - Positive Culture Follow-up: Successful Patient Follow-Up  Culture assessed and recommendations reviewed by:  []  , Pharm.D. []  Enzo Bi, .D., BCPS AQ-ID []  Celedonio Miyamoto, Pharm.D., BCPS []  [address], Pharm.D., BCPS []  [place], Garvin Fila.D., BCPS, AAHIVP []  , Pharm.D., BCPS, AAHIVP []  Georgina Pillion, PharmD, BCPS []  , PharmD, BCPS []  [place], PharmD, BCPS []  [address], PharmD PharmD  Positive urine culture  []  Patient discharged without antimicrobial prescription and treatment is now indicated []  Organism is resistant to prescribed ED discharge antimicrobial []  Patient with positive blood cultures  Changes discussed with ED provider: Estella Husk PA New antibiotic prescription symptom check, if + abdominal/lower back pain and dysuria/urgency/frequency, start keflex 500mg  po bid x 5 days, if fevers, flank pain start keflex 500mg  po qid x 10 days  Attempting to contact patient  03/16/2020, 1:55 PM

## 2020-03-16 NOTE — Progress Notes (Signed)
ED Antimicrobial Stewardship Positive Culture Follow Up   Veronica Huff is an 34 y.o. female who presented to [facility] on 03/12/2020 with a chief complaint of  Chief Complaint  Patient presents with  . Fecal Impaction    Recent Results (from the past 720 hour(s))  Urine culture     Status: Abnormal   Collection Time: 03/12/20 11:32 PM   Specimen: Urine, Catheterized  Result Value Ref Range Status   Specimen Description   Final    URINE, CATHETERIZED Performed at [facility], [address], [place], [place] [address]    Special Requests   Final    NONE Performed at [facility], [address], [place], [place] [address]    Culture >=100,000 COLONIES/mL ESCHERICHIA COLI (A)  Final   Report Status 03/15/2020 FINAL  Final   Organism ID, Bacteria ESCHERICHIA COLI (A)  Final      Susceptibility   Escherichia coli - MIC*    AMPICILLIN >=32 RESISTANT Resistant     CEFAZOLIN <=4 SENSITIVE Sensitive     CEFTRIAXONE <=1 SENSITIVE Sensitive     CIPROFLOXACIN >=4 RESISTANT Resistant     GENTAMICIN 8 INTERMEDIATE Intermediate     IMIPENEM <=0.25 SENSITIVE Sensitive     NITROFURANTOIN >=512 RESISTANT Resistant     TRIMETH/SULFA >=320 RESISTANT Resistant     AMPICILLIN/SULBACTAM 16 INTERMEDIATE Intermediate     PIP/TAZO <=4 SENSITIVE Sensitive     * >=100,000 COLONIES/mL ESCHERICHIA COLI    Plan: contact patient for symptom check:  If any new suprapubic pain, dysuria, urgency/frequency, start Keflex 500 mg PO BID x 5d (#10)  If any new fevers or flank pain, start Keflex 500 mg PO 4 times daily x 10 days (#40)  ED Provider: Harvie Heck, PA-C   Jaylenn Baiza A 03/16/2020, 11:51 AM Clinical Pharmacist [phone]

## 2020-03-18 ENCOUNTER — Other Ambulatory Visit: Payer: Self-pay | Admitting: Neurology

## 2020-03-18 DIAGNOSIS — G8250 Quadriplegia, unspecified: Secondary | ICD-10-CM

## 2020-03-18 DIAGNOSIS — R519 Headache, unspecified: Secondary | ICD-10-CM

## 2020-04-20 ENCOUNTER — Telehealth: Payer: Self-pay | Admitting: *Deleted

## 2020-04-20 NOTE — Telephone Encounter (Signed)
Pt left VM message stating that Dr. Marice Potter had recommended she see Dr. Macon Large for future visits due to her retirement. Please call back.

## 2020-04-21 NOTE — Telephone Encounter (Signed)
LVM to call office if she needed anything and to ask to be scheduled with dr.[email or web address]

## 2020-04-30 ENCOUNTER — Other Ambulatory Visit: Payer: Self-pay

## 2020-04-30 ENCOUNTER — Emergency Department (HOSPITAL_COMMUNITY)
Admission: EM | Admit: 2020-04-30 | Discharge: 2020-04-30 | Disposition: A | Discharge status: Home / Self Care | Payer: Medicare Other | Attending: Emergency Medicine | Admitting: Emergency Medicine

## 2020-04-30 ENCOUNTER — Encounter (HOSPITAL_COMMUNITY): Payer: Self-pay

## 2020-04-30 DIAGNOSIS — N3289 Other specified disorders of bladder: Secondary | ICD-10-CM

## 2020-04-30 DIAGNOSIS — Z77098 Contact with and (suspected) exposure to other hazardous, chiefly nonmedicinal, chemicals: Secondary | ICD-10-CM | POA: Diagnosis not present

## 2020-04-30 DIAGNOSIS — Z79899 Other long term (current) drug therapy: Secondary | ICD-10-CM | POA: Insufficient documentation

## 2020-04-30 DIAGNOSIS — R102 Pelvic and perineal pain: Secondary | ICD-10-CM | POA: Insufficient documentation

## 2020-04-30 DIAGNOSIS — G8250 Quadriplegia, unspecified: Secondary | ICD-10-CM | POA: Diagnosis not present

## 2020-04-30 LAB — CBC WITH DIFFERENTIAL/PLATELET
Abs Immature Granulocytes: 0.02 10*3/uL (ref 0.00–0.07)
Basophils Absolute: 0 10*3/uL (ref 0.0–0.1)
Basophils Relative: 0 %
Eosinophils Absolute: 0 10*3/uL (ref 0.0–0.5)
Eosinophils Relative: 0 %
HCT: 51.2 % — ABNORMAL HIGH (ref 36.0–46.0)
Hemoglobin: 17 g/dL — ABNORMAL HIGH (ref 12.0–15.0)
Immature Granulocytes: 0 %
Lymphocytes Relative: 21 %
Lymphs Abs: 1.5 10*3/uL (ref 0.7–4.0)
MCH: 28.9 pg (ref 26.0–34.0)
MCHC: 33.2 g/dL (ref 30.0–36.0)
MCV: 87.1 fL (ref 80.0–100.0)
Monocytes Absolute: 0.6 10*3/uL (ref 0.1–1.0)
Monocytes Relative: 8 %
Neutro Abs: 5.1 10*3/uL (ref 1.7–7.7)
Neutrophils Relative %: 71 %
Platelets: 130 10*3/uL — ABNORMAL LOW (ref 150–400)
RBC: 5.88 MIL/uL — ABNORMAL HIGH (ref 3.87–5.11)
RDW: 13.8 % (ref 11.5–15.5)
WBC: 7.2 10*3/uL (ref 4.0–10.5)
nRBC: 0 % (ref 0.0–0.2)

## 2020-04-30 LAB — COMPREHENSIVE METABOLIC PANEL
ALT: 14 U/L (ref 0–44)
AST: 19 U/L (ref 15–41)
Albumin: 3.8 g/dL (ref 3.5–5.0)
Alkaline Phosphatase: 62 U/L (ref 38–126)
Anion gap: 10 (ref 5–15)
BUN: 5 mg/dL — ABNORMAL LOW (ref 6–20)
CO2: 24 mmol/L (ref 22–32)
Calcium: 9.1 mg/dL (ref 8.9–10.3)
Chloride: 102 mmol/L (ref 98–111)
Creatinine, Ser: 0.37 mg/dL — ABNORMAL LOW (ref 0.44–1.00)
GFR calc Af Amer: >60 mL/min (ref >60)
GFR calc non Af Amer: >60 mL/min (ref >60)
Glucose, Bld: 112 mg/dL — ABNORMAL HIGH (ref 70–99)
Potassium: 3.8 mmol/L (ref 3.5–5.1)
Sodium: 136 mmol/L (ref 135–145)
Total Bilirubin: 0.9 mg/dL (ref 0.3–1.2)
Total Protein: 7.4 g/dL (ref 6.5–8.1)

## 2020-04-30 MED ORDER — HYDROMORPHONE HCL 1 MG/ML IJ SOLN
0.5000 mg | Freq: Once | INTRAMUSCULAR | Status: AC
  Administered 2020-04-30: 0.5 mg via INTRAVENOUS
  Filled 2020-04-30: qty 1

## 2020-04-30 MED ORDER — SODIUM CHLORIDE 0.9 % IV BOLUS
1000.0000 mL | Freq: Once | INTRAVENOUS | Status: AC
  Administered 2020-04-30: 1000 mL via INTRAVENOUS
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
## 2020-04-30 NOTE — ED Notes (Signed)
Irrigated urostomy with 50cc sterile water with return of fluid, amber colored urine returning in foley catheter. [facility] called for transport.
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
## 2020-04-30 NOTE — Discharge Instructions (Signed)
Please keep the Foley in place for the next 1 week.  You may flush it as needed.  Please use ibuprofen 60 mg every 6 hours as needed for pain.  May also take Tylenol 1000 mg between doses of ibuprofen as needed.  Please follow-up with Dr. Earlene Plater in clinic.  You may continue take Azo.

## 2020-05-11 ENCOUNTER — Other Ambulatory Visit: Payer: Self-pay | Admitting: Neurology

## 2020-05-18 ENCOUNTER — Other Ambulatory Visit: Payer: Self-pay | Admitting: Urology

## 2020-05-18 ENCOUNTER — Other Ambulatory Visit (HOSPITAL_COMMUNITY): Payer: Self-pay | Admitting: Urology

## 2020-05-18 DIAGNOSIS — T304 Corrosion of unspecified body region, unspecified degree: Secondary | ICD-10-CM

## 2020-05-18 DIAGNOSIS — N319 Neuromuscular dysfunction of bladder, unspecified: Secondary | ICD-10-CM

## 2020-05-19 ENCOUNTER — Telehealth: Payer: Self-pay | Admitting: Neurology

## 2020-05-19 NOTE — Telephone Encounter (Signed)
FYI- Patient had 8 vials of Botox in the fridge and has not had a Botox injection since 07/2018. These were delivered from [facility]. I have taken patient's name off of the boxes and I have placed them in the sample bin with Dr. Zannie Cove name.

## 2020-05-19 NOTE — Telephone Encounter (Signed)
I called the patient and spoke with someone who stated that the patient is asleep. I requested that the patient call us back to discuss this situation.

## 2020-05-19 NOTE — Telephone Encounter (Signed)
We may need to check with the patient to confirm. She has not been due to reservations over COVID-19.  Last note from 11/25/2019:  Assessment and Plan: Spastic quadriplegia, wheelchair dependent since her brainstem stroke at age 34             She is on polypharmacy for spasticity             Continue current medications               Follow Up Instructions:             She will call clinic when she feels safe to come out for injection.

## 2020-05-25 ENCOUNTER — Ambulatory Visit (HOSPITAL_COMMUNITY): Payer: Medicare Other

## 2020-05-25 NOTE — Telephone Encounter (Signed)
I called the patient about this situation. She did not answer, and has not responded to my MyChart message.

## 2020-05-27 ENCOUNTER — Other Ambulatory Visit: Payer: Self-pay

## 2020-05-27 ENCOUNTER — Ambulatory Visit (HOSPITAL_COMMUNITY)
Admission: RE | Admit: 2020-05-27 | Discharge: 2020-05-27 | Disposition: A | Discharge status: Home / Self Care | Payer: Medicare Other | Source: Ambulatory Visit | Attending: Urology | Admitting: Urology

## 2020-05-27 DIAGNOSIS — N319 Neuromuscular dysfunction of bladder, unspecified: Secondary | ICD-10-CM | POA: Diagnosis not present

## 2020-05-27 DIAGNOSIS — T798XXS Other early complications of trauma, sequela: Secondary | ICD-10-CM | POA: Insufficient documentation

## 2020-05-27 DIAGNOSIS — T2899XS Corrosions of other internal organs, sequela: Secondary | ICD-10-CM | POA: Diagnosis not present

## 2020-05-27 DIAGNOSIS — T304 Corrosion of unspecified body region, unspecified degree: Secondary | ICD-10-CM

## 2020-05-27 DIAGNOSIS — G8250 Quadriplegia, unspecified: Secondary | ICD-10-CM | POA: Insufficient documentation

## 2020-05-27 DIAGNOSIS — T5494XS Toxic effect of unspecified corrosive substance, undetermined, sequela: Secondary | ICD-10-CM | POA: Diagnosis present

## 2020-05-27 MED ORDER — IOTHALAMATE MEGLUMINE 17.2 % UR SOLN
250.0000 mL | Freq: Once | URETHRAL | Status: AC | PRN
  Administered 2020-05-27: 250 mL via INTRAVESICAL

## 2020-06-15 ENCOUNTER — Other Ambulatory Visit: Payer: Self-pay | Admitting: Neurology

## 2020-06-23 ENCOUNTER — Ambulatory Visit: Payer: Medicare Other | Admitting: Obstetrics & Gynecology

## 2020-06-29 ENCOUNTER — Telehealth: Payer: Self-pay

## 2020-06-29 NOTE — Telephone Encounter (Signed)
CALL TFER FROM REF COORD.PTN WAS REFERRED TO ML - ADVISED UNABLE TO COME TO PRACTICE AS DC FROM [facility]- CONTACTED [facility] IS [address] [place] - SHE DOES KNOW [facility] IS SUPPOSED TO HAVE A REHAB PHYSICIAN DOESN'T KNOW NAME

## 2020-07-13 ENCOUNTER — Other Ambulatory Visit: Payer: Self-pay | Admitting: Neurology

## 2020-07-21 ENCOUNTER — Telehealth: Payer: Self-pay | Admitting: Obstetrics & Gynecology

## 2020-07-21 NOTE — Telephone Encounter (Signed)
Spoke to patient about her getting her scheduled for an appointment with Dr. Macon Large. Patient stated that she does not have anything emergent that is going on and she would like to schedule the appointment for next year because of the new delta virus and she has an open airway. Patient instructed to give the office a call back when she is ready to have her appointment scheduled. Patient verbalized understanding.

## 2020-08-04 ENCOUNTER — Other Ambulatory Visit: Payer: Self-pay | Admitting: Internal Medicine

## 2020-08-04 ENCOUNTER — Ambulatory Visit: Payer: Medicare Other | Admitting: Obstetrics and Gynecology

## 2020-08-04 DIAGNOSIS — M5416 Radiculopathy, lumbar region: Secondary | ICD-10-CM

## 2020-08-05 ENCOUNTER — Other Ambulatory Visit: Payer: Self-pay | Admitting: *Deleted

## 2020-08-05 DIAGNOSIS — R519 Headache, unspecified: Secondary | ICD-10-CM

## 2020-08-05 MED ORDER — IMIPRAMINE HCL 50 MG PO TABS: ORAL_TABLET | ORAL | 7 refills | Status: DC

## 2020-08-25 ENCOUNTER — Ambulatory Visit
Admission: RE | Admit: 2020-08-25 | Discharge: 2020-08-25 | Disposition: A | Discharge status: Home / Self Care | Payer: Medicare Other | Source: Ambulatory Visit | Attending: Internal Medicine | Admitting: Internal Medicine

## 2020-08-25 DIAGNOSIS — M5416 Radiculopathy, lumbar region: Secondary | ICD-10-CM

## 2020-08-31 ENCOUNTER — Other Ambulatory Visit: Payer: Self-pay | Admitting: *Deleted

## 2020-08-31 ENCOUNTER — Telehealth: Payer: Self-pay | Admitting: Neurology

## 2020-08-31 MED ORDER — DANTROLENE SODIUM 100 MG PO CAPS: ORAL_CAPSULE | ORAL | 0 refills | Status: DC

## 2020-08-31 NOTE — Telephone Encounter (Signed)
Prescription sent to the requested pharmacy

## 2020-08-31 NOTE — Telephone Encounter (Signed)
Pt is requesting a refill for dantrolene (DANTRIUM) 100 MG capsule.  Pharmacy:  [facility]

## 2020-09-13 ENCOUNTER — Ambulatory Visit: Payer: Medicare Other | Admitting: Obstetrics & Gynecology

## 2020-10-11 ENCOUNTER — Other Ambulatory Visit: Payer: Self-pay | Admitting: Neurology

## 2020-11-01 ENCOUNTER — Inpatient Hospital Stay (HOSPITAL_COMMUNITY)
Admission: EM | Admit: 2020-11-01 | Discharge: 2020-11-03 | DRG: 056 | Disposition: A | Discharge status: Home / Self Care | Payer: Medicare Other | Attending: Internal Medicine | Admitting: Internal Medicine

## 2020-11-01 ENCOUNTER — Other Ambulatory Visit: Payer: Self-pay

## 2020-11-01 ENCOUNTER — Emergency Department (HOSPITAL_COMMUNITY): Payer: Medicare Other

## 2020-11-01 ENCOUNTER — Encounter (HOSPITAL_COMMUNITY): Payer: Self-pay

## 2020-11-01 DIAGNOSIS — Z801 Family history of malignant neoplasm of trachea, bronchus and lung: Secondary | ICD-10-CM

## 2020-11-01 DIAGNOSIS — Z9049 Acquired absence of other specified parts of digestive tract: Secondary | ICD-10-CM

## 2020-11-01 DIAGNOSIS — Z79899 Other long term (current) drug therapy: Secondary | ICD-10-CM

## 2020-11-01 DIAGNOSIS — F411 Generalized anxiety disorder: Secondary | ICD-10-CM | POA: Diagnosis present

## 2020-11-01 DIAGNOSIS — Z993 Dependence on wheelchair: Secondary | ICD-10-CM

## 2020-11-01 DIAGNOSIS — I69891 Dysphagia following other cerebrovascular disease: Secondary | ICD-10-CM

## 2020-11-01 DIAGNOSIS — G4730 Sleep apnea, unspecified: Secondary | ICD-10-CM | POA: Diagnosis present

## 2020-11-01 DIAGNOSIS — R5381 Other malaise: Secondary | ICD-10-CM | POA: Diagnosis present

## 2020-11-01 DIAGNOSIS — G8250 Quadriplegia, unspecified: Secondary | ICD-10-CM | POA: Diagnosis present

## 2020-11-01 DIAGNOSIS — I639 Cerebral infarction, unspecified: Secondary | ICD-10-CM | POA: Diagnosis present

## 2020-11-01 DIAGNOSIS — Z91030 Bee allergy status: Secondary | ICD-10-CM

## 2020-11-01 DIAGNOSIS — D696 Thrombocytopenia, unspecified: Secondary | ICD-10-CM | POA: Diagnosis present

## 2020-11-01 DIAGNOSIS — I69398 Other sequelae of cerebral infarction: Secondary | ICD-10-CM

## 2020-11-01 DIAGNOSIS — T17908A Unspecified foreign body in respiratory tract, part unspecified causing other injury, initial encounter: Secondary | ICD-10-CM

## 2020-11-01 DIAGNOSIS — K5900 Constipation, unspecified: Secondary | ICD-10-CM | POA: Diagnosis present

## 2020-11-01 DIAGNOSIS — Z87440 Personal history of urinary (tract) infections: Secondary | ICD-10-CM

## 2020-11-01 DIAGNOSIS — Z888 Allergy status to other drugs, medicaments and biological substances status: Secondary | ICD-10-CM

## 2020-11-01 DIAGNOSIS — Z20822 Contact with and (suspected) exposure to covid-19: Secondary | ICD-10-CM | POA: Diagnosis present

## 2020-11-01 DIAGNOSIS — Z833 Family history of diabetes mellitus: Secondary | ICD-10-CM

## 2020-11-01 DIAGNOSIS — R1310 Dysphagia, unspecified: Secondary | ICD-10-CM

## 2020-11-01 DIAGNOSIS — J398 Other specified diseases of upper respiratory tract: Secondary | ICD-10-CM | POA: Diagnosis present

## 2020-11-01 DIAGNOSIS — N319 Neuromuscular dysfunction of bladder, unspecified: Secondary | ICD-10-CM | POA: Diagnosis present

## 2020-11-01 DIAGNOSIS — N3020 Other chronic cystitis without hematuria: Secondary | ICD-10-CM | POA: Diagnosis present

## 2020-11-01 DIAGNOSIS — J9610 Chronic respiratory failure, unspecified whether with hypoxia or hypercapnia: Secondary | ICD-10-CM | POA: Diagnosis present

## 2020-11-01 DIAGNOSIS — J9611 Chronic respiratory failure with hypoxia: Secondary | ICD-10-CM

## 2020-11-01 DIAGNOSIS — Z930 Tracheostomy status: Secondary | ICD-10-CM

## 2020-11-01 DIAGNOSIS — Z880 Allergy status to penicillin: Secondary | ICD-10-CM

## 2020-11-01 DIAGNOSIS — Z881 Allergy status to other antibiotic agents status: Secondary | ICD-10-CM

## 2020-11-01 DIAGNOSIS — Z8249 Family history of ischemic heart disease and other diseases of the circulatory system: Secondary | ICD-10-CM

## 2020-11-01 DIAGNOSIS — I69365 Other paralytic syndrome following cerebral infarction, bilateral: Secondary | ICD-10-CM

## 2020-11-01 DIAGNOSIS — Z7401 Bed confinement status: Secondary | ICD-10-CM

## 2020-11-01 DIAGNOSIS — Z885 Allergy status to narcotic agent status: Secondary | ICD-10-CM

## 2020-11-01 DIAGNOSIS — F32A Depression, unspecified: Secondary | ICD-10-CM | POA: Diagnosis present

## 2020-11-01 LAB — CBC WITH DIFFERENTIAL/PLATELET
Abs Immature Granulocytes: 0.01 10*3/uL (ref 0.00–0.07)
Basophils Absolute: 0 10*3/uL (ref 0.0–0.1)
Basophils Relative: 0 %
Eosinophils Absolute: 0 10*3/uL (ref 0.0–0.5)
Eosinophils Relative: 0 %
HCT: 43.9 % (ref 36.0–46.0)
Hemoglobin: 14.2 g/dL (ref 12.0–15.0)
Immature Granulocytes: 0 %
Lymphocytes Relative: 32 %
Lymphs Abs: 1.5 10*3/uL (ref 0.7–4.0)
MCH: 29.8 pg (ref 26.0–34.0)
MCHC: 32.3 g/dL (ref 30.0–36.0)
MCV: 92 fL (ref 80.0–100.0)
Monocytes Absolute: 0.4 10*3/uL (ref 0.1–1.0)
Monocytes Relative: 8 %
Neutro Abs: 2.8 10*3/uL (ref 1.7–7.7)
Neutrophils Relative %: 60 %
Platelets: 120 10*3/uL — ABNORMAL LOW (ref 150–400)
RBC: 4.77 MIL/uL (ref 3.87–5.11)
RDW: 13.7 % (ref 11.5–15.5)
WBC: 4.7 10*3/uL (ref 4.0–10.5)
nRBC: 0 % (ref 0.0–0.2)

## 2020-11-01 LAB — COMPREHENSIVE METABOLIC PANEL
ALT: 16 U/L (ref 0–44)
AST: 18 U/L (ref 15–41)
Albumin: 4.1 g/dL (ref 3.5–5.0)
Alkaline Phosphatase: 53 U/L (ref 38–126)
Anion gap: 11 (ref 5–15)
BUN: 10 mg/dL (ref 6–20)
CO2: 25 mmol/L (ref 22–32)
Calcium: 8.9 mg/dL (ref 8.9–10.3)
Chloride: 102 mmol/L (ref 98–111)
Creatinine, Ser: <0.3 mg/dL — ABNORMAL LOW (ref 0.44–1.00)
Glucose, Bld: 89 mg/dL (ref 70–99)
Potassium: 3.6 mmol/L (ref 3.5–5.1)
Sodium: 138 mmol/L (ref 135–145)
Total Bilirubin: 0.6 mg/dL (ref 0.3–1.2)
Total Protein: 7.4 g/dL (ref 6.5–8.1)

## 2020-11-01 LAB — I-STAT BETA HCG BLOOD, ED (MC, WL, AP ONLY): I-stat hCG, quantitative: <5 m[IU]/mL (ref <5)

## 2020-11-01 LAB — RESP PANEL BY RT-PCR (FLU A&B, COVID) ARPGX2
Influenza A by PCR: NEGATIVE
Influenza B by PCR: NEGATIVE
SARS Coronavirus 2 by RT PCR: NEGATIVE

## 2020-11-01 MED ORDER — SODIUM CHLORIDE 0.9 % IV BOLUS
1000.0000 mL | Freq: Once | INTRAVENOUS | Status: AC
  Administered 2020-11-01: 1000 mL via INTRAVENOUS

## 2020-11-01 MED ORDER — FLUTICASONE PROPIONATE 50 MCG/ACT NA SUSP
2.0000 | Freq: Every day | NASAL | Status: DC
  Administered 2020-11-03: 2 via NASAL
  Filled 2020-11-01: qty 16

## 2020-11-01 MED ORDER — IMIPRAMINE HCL 50 MG PO TABS
100.0000 mg | ORAL_TABLET | Freq: Every day | ORAL | Status: DC
  Filled 2020-11-01 (×3): qty 2

## 2020-11-01 MED ORDER — LIDOCAINE HCL URETHRAL/MUCOSAL 2 % EX GEL
1.0000 "application " | Freq: Once | CUTANEOUS | Status: DC
  Filled 2020-11-01: qty 11

## 2020-11-01 MED ORDER — LACTULOSE 10 GM/15ML PO SOLN: 10.0000 g | Freq: Every day | ORAL | Status: DC | PRN

## 2020-11-01 MED ORDER — NITROFURANTOIN MACROCRYSTAL 50 MG PO CAPS
50.0000 mg | ORAL_CAPSULE | Freq: Every day | ORAL | Status: DC
  Administered 2020-11-02: 50 mg via ORAL
  Filled 2020-11-01 (×3): qty 1

## 2020-11-01 MED ORDER — BUSPIRONE HCL 5 MG PO TABS
5.0000 mg | ORAL_TABLET | Freq: Two times a day (BID) | ORAL | Status: DC
  Filled 2020-11-01: qty 1

## 2020-11-01 MED ORDER — BISACODYL 10 MG RE SUPP: 10.0000 mg | RECTAL | Status: DC | PRN

## 2020-11-01 MED ORDER — BACLOFEN 20 MG PO TABS
40.0000 mg | ORAL_TABLET | Freq: Four times a day (QID) | ORAL | Status: DC
  Administered 2020-11-02 – 2020-11-03 (×3): 40 mg via ORAL
  Filled 2020-11-01 (×3): qty 2

## 2020-11-01 MED ORDER — SERTRALINE HCL 100 MG PO TABS
100.0000 mg | ORAL_TABLET | Freq: Every day | ORAL | Status: DC
Start: 2020-11-01 — End: 2020-11-04

## 2020-11-01 MED ORDER — GABAPENTIN 300 MG PO CAPS
300.0000 mg | ORAL_CAPSULE | Freq: Three times a day (TID) | ORAL | Status: DC
  Administered 2020-11-03: 300 mg via ORAL
  Filled 2020-11-01 (×2): qty 1

## 2020-11-01 MED ORDER — IBUPROFEN 200 MG PO TABS: 400.0000 mg | ORAL_TABLET | Freq: Four times a day (QID) | ORAL | Status: DC | PRN

## 2020-11-01 MED ORDER — POLYETHYLENE GLYCOL 3350 17 G PO PACK
17.0000 g | PACK | Freq: Every day | ORAL | Status: DC
  Filled 2020-11-01: qty 1

## 2020-11-01 MED ORDER — ENOXAPARIN SODIUM 40 MG/0.4ML ~~LOC~~ SOLN
40.0000 mg | SUBCUTANEOUS | Status: DC
  Administered 2020-11-02: 40 mg via SUBCUTANEOUS
  Filled 2020-11-01: qty 0.4

## 2020-11-01 MED ORDER — SUCRALFATE 1 GM/10ML PO SUSP
1.0000 g | Freq: Three times a day (TID) | ORAL | Status: DC
  Administered 2020-11-02: 1 g via ORAL
  Filled 2020-11-01 (×2): qty 10

## 2020-11-01 MED ORDER — TOPIRAMATE 25 MG PO TABS: 50.0000 mg | ORAL_TABLET | Freq: Every day | ORAL | Status: DC

## 2020-11-01 MED ORDER — DANTROLENE SODIUM 100 MG PO CAPS
100.0000 mg | ORAL_CAPSULE | Freq: Three times a day (TID) | ORAL | Status: DC
  Administered 2020-11-02 – 2020-11-03 (×2): 100 mg via ORAL
  Filled 2020-11-01 (×7): qty 1

## 2020-11-01 MED ORDER — SODIUM CHLORIDE 0.9 % IV SOLN: INTRAVENOUS | Status: DC

## 2020-11-01 NOTE — Progress Notes (Signed)
Pt uses home Astra Vent QHS for OSA but is unable to have machine brought in.  Pt offered the use of one of our ventilators but pt is refusing at this time.  Pt insists on wearing PMV valve since she isn't using home VENT tonight.  Caregiver at bedside, pt to notify RT if she needs anything or decides to use VENT.

## 2020-11-01 NOTE — ED Notes (Signed)
This nurse also spoke with house coverage Katherina Right who agrees with plan to wait for NG tube placement for possible ENT evaluation

## 2020-11-01 NOTE — ED Notes (Signed)
This nurse spoke with MD Adhikari about NG tube placement, suggests to plan for placement tomorrow.

## 2020-11-01 NOTE — ED Provider Notes (Signed)
[facility]-EMERGENCY DEPT Provider Note   CSN: [id number] Arrival date & time: 11/01/20  1158     History Chief Complaint  Patient presents with  . Aspiration    tracheostomy    Deaundra CHAYLA SHANDS is a 34 y.o. female.  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 Jenne Pane with tracheal stenosis and pharyngeal dysphagia who presents with concern for dysphagia.  Reports being seen in May by Dr. Jenne Pane for these symptoms and was ato be scheduled with [facility] radiology for barium swallow however they never called to schedule. Saturday, she was attempting to swallow a pill and feels it was sharp and lodged in her throat and since then has had a hoarse voice which is new since Saturday. Today for the first time, she drank water and it came directly out of her stoma.  Fiberoptic exam showed prominent granulation circumferentially at the level of the trach tube.  She has not been diagnosed with pneumonia or hospitalized. She reports suctioning and changing trach did not help.  No other symptoms including no drooling, dyspnea, fever, chest pain, n/v/d.   Past Medical History:  Diagnosis Date  . Anxiety   . 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  . Depression    Zoloft has helped-not having any current depression  . Edema extremities    Feet and legs-mostly in feet  . Headache(784.0)    Prior to imipramine-none currently  . Pneumonia   . Quadriplegia (HCC)    Secondary to stroke at age 77-has trach-lives with boyfriend-motorized w/c-joystick to chin to drive w/c--I & O cath thru suprapubic stoma - has nursing care 16 hrs daily and boyfriend able to help pt the other hours of day.  . Recurrent UTI (urinary tract infection)    Due to suprapubic stoma, repetitive catheterizations  . Sleep apnea    On ventilator at  night -has oxygen but rarely needs unless she has a cold and sats drop  . Stroke [facility])     Patient Active Problem List   Diagnosis Date Noted  . Dysphagia 11/01/2020  . Dysphagia following other cerebrovascular disease 11/01/2020  . Spastic tetraplegia with rigidity syndrome (HCC) 12/11/2017  . Allergic rhinitis 09/12/2016  . Allergic conjunctivitis 09/12/2016  . Chronic respiratory failure (HCC) 01/17/2016  . Spastic tetraplegia (HCC) 09/29/2015  . Chronic daily headache 09/29/2015  . Brain stem infarction (HCC) 07/30/2015  . Anger reaction 07/30/2015  . Generalized anxiety disorder 07/30/2015  . Venous (peripheral) insufficiency 12/24/2014  . Quadriplegia (HCC)   . CVA (cerebral vascular accident) [facility])     Past Surgical History:  Procedure Laterality Date  . BLADDER SURGERY  2009,2012  . BRONCHOSCOPY  2006  . CHOLECYSTECTOMY  11/18/2012   Procedure: LAPAROSCOPIC CHOLECYSTECTOMY;  Surgeon: Axel Filler, MD;  Location: [facility] ORS;  Service: General;  Laterality: N/A;  . ESOPHAGOGASTRODUODENOSCOPY    . GASTROSTOMY TUBE PLACEMENT  2000  . ILEOSTOMY  2006  . MICROLARYNGOSCOPY WITH LASER AND BALLOON DILATION N/A 09/27/2016   Procedure: MICROLARYNGOSCOPY WITH LASER AND BALLOON DILATION;  Surgeon: Christia Reading, MD;  Location: [facility] OR;  Service: ENT;  Laterality: N/A;  micro direct laryngoscopy with co2 laser balloon dilation of trachea  . REMOVAL OF GASTROSTOMY TUBE    . SPINE SURGERY  2004,2011  . TENDON REPAIR  2012   Left wrist  .  THROAT SURGERY  2004  . TRACHEOSTOMY  2000  . wirst surgery Left   . WISDOM TOOTH EXTRACTION  2008     OB History    Gravida  0   Para  0   Term  0   Preterm  0   AB  0   Living  0     SAB  0   IAB  0   Ectopic  0   Multiple  0   Live Births              Family History  Problem Relation Age of Onset  . Diabetes Mother   . Hypertension Mother   . Varicose Veins Mother   . Cancer Maternal Aunt        lung  . Diabetes  Maternal Grandfather   . Heart disease Maternal Grandfather   . Heart disease Paternal Grandfather   . Other Sister        overdose    Social History   Tobacco Use  . Smoking status: Never Smoker  . Smokeless tobacco: Never Used  Vaping Use  . Vaping Use: Never used  Substance Use Topics  . Alcohol use: No    Alcohol/week: 0.0 standard drinks  . Drug use: No    Types: Marijuana    Home Medications Prior to Admission medications   Medication Sig Start Date End Date Taking? Authorizing Provider  baclofen (LIORESAL) 20 MG tablet TAKE TWO TABLETS BY MOUTH FOUR TIMES A DAY Patient taking differently: Take 40 mg by mouth 4 (four) times daily. 10/11/20  Yes Levert Feinstein, MD  bisacodyl (DULCOLAX) 10 MG suppository Place 10 mg rectally every other day as needed for moderate constipation. constipation   Yes [provider]  busPIRone (BUSPAR) 5 MG tablet Take 5 mg by mouth 2 (two) times daily. 09/30/20  Yes [provider]  clotrimazole-betamethasone (LOTRISONE) cream Apply 1 application topically daily as needed (for irritation). On labium 10/28/20  Yes [provider]  dantrolene (DANTRIUM) 100 MG capsule TAKE 1 CAPSULE BY MOUTH 3 TIMES DAILY. NEED TO CALL OFFICE FOR FURTHER REFILLS [phone]. Patient taking differently: Take 100 mg by mouth 3 (three) times daily. 08/31/20  Yes Levert Feinstein, MD  EPIPEN 2-PAK 0.3 MG/0.3ML SOAJ injection Inject 0.3 mg into the muscle daily as needed for anaphylaxis. 05/10/15  Yes [provider]  fluticasone (FLONASE) 50 MCG/ACT nasal spray INSERT 2 SPRAYS IN EACH NOSTRIL EVERY DAY FOR STUFFY NOSE OR DRAINAGE Patient taking differently: Place 2 sprays into both nostrils daily. 08/12/18  Yes Bobbitt, Heywood Iles, MD  gabapentin (NEURONTIN) 300 MG capsule Take 300 mg by mouth 3 (three) times daily.   Yes [provider]  hydrocortisone cream 0.5 % Apply 1 application topically 2 (two) times daily. To hairline and brows    Yes [provider]  ibuprofen (ADVIL) 200 MG tablet Take 400 mg by mouth every 6 (six) hours as needed for fever, headache, mild pain, moderate pain or cramping.   Yes [provider]  imipramine (TOFRANIL) 50 MG tablet TAKE 2 TABLETS (100 MG TOTAL) BY MOUTH AT BEDTIME. Patient taking differently: Take 100 mg by mouth at bedtime. 08/05/20  Yes Levert Feinstein, MD  ketorolac (ACULAR) 0.5 % ophthalmic solution Place 1 drop into both eyes daily.  04/01/19  Yes [provider]  lactulose (CHRONULAC) 10 GM/15ML solution Take 10 g by mouth daily as needed for mild constipation. 08/06/20  Yes [provider]  levocetirizine (  XYZAL) 5 MG tablet TAKE 1 TABLET BY MOUTH EVERY DAY AS NEEDED Patient taking differently: Take 5 mg by mouth every evening. 03/24/19  Yes Bobbitt, Heywood Iles, MD  medroxyPROGESTERone (DEPO-PROVERA) 150 MG/ML injection Inject 150 mg into the muscle every 3 (three) months.  07/09/15  Yes [provider]  Multiple Vitamin (MULTIVITAMIN WITH MINERALS) TABS tablet Take 1 tablet by mouth daily.   Yes [provider]  mupirocin ointment (BACTROBAN) 2 % 1 application by Tracheal Tube route daily. Applies to trach-stoma site   Yes [provider]  nitrofurantoin (MACRODANTIN) 50 MG capsule Take 50 mg by mouth at bedtime.   Yes [provider]  nystatin (MYCOSTATIN) powder Apply 1 application topically 2 (two) times daily as needed (skin irritation). Small amount . Rash   Yes [provider]  polyethylene glycol powder (MIRALAX) 17 GM/SCOOP powder Start taking 1 capful 3 times a day. Slowly cut back as needed until you have normal bowel movements. Patient taking differently: Take 1 Container by mouth daily. 03/13/20  Yes Cardama, Amadeo Garnet, MD  sertraline (ZOLOFT) 100 MG tablet Take 100 mg by mouth at bedtime.   Yes [provider]  solifenacin (VESICARE) 10 MG tablet Take 10 mg by mouth daily.   Yes [provider]  sucralfate (CARAFATE) 1 GM/10ML suspension Take 1 g by mouth 3 (three) times daily.    Yes [provider]  topiramate (TOPAMAX) 50 MG tablet TAKE 1 TABLET BY MOUTH AT BEDTIME. Patient taking differently: Take 50 mg by mouth at bedtime. 03/18/20  Yes Levert Feinstein, MD    Allergies    Bee venom, Amoxicillin, Ciprofloxacin, Tobramycin, Erythromycin, Trazodone, Trazodone and nefazodone, Morphine and related, and Vantin [cefpodoxime]  Review of Systems   Review of Systems  Constitutional: Negative for fever.  HENT: Positive for trouble swallowing and voice change. Negative for sore throat.   Respiratory: Negative for cough and shortness of breath.   Cardiovascular: Negative for chest pain.  Gastrointestinal: Negative for abdominal pain, nausea and vomiting.  Genitourinary: Negative for difficulty urinating.  Skin: Negative for rash.  Neurological: Negative for syncope.    Physical Exam Updated Vital Signs BP 116/62   Pulse 97   Temp 98.1 F (36.7 C) (Oral)   Resp 15   Ht 5\' 2"  (1.575 m)   Wt 54.4 kg   SpO2 97%   BMI 21.95 kg/m   Physical Exam Vitals and nursing note reviewed.  Constitutional:      General: She is not in acute distress.    Appearance: Normal appearance. She is not ill-appearing, toxic-appearing or diaphoretic.  HENT:     Head: Normocephalic.  Eyes:     Conjunctiva/sclera: Conjunctivae normal.  Neck:     Comments: Tracheostomy in place, no surrounding redness, no drainage Cardiovascular:     Rate and Rhythm: Normal rate and regular rhythm.     Pulses: Normal pulses.  Pulmonary:     Effort: Pulmonary effort is normal. No respiratory distress.  Musculoskeletal:        General: No deformity or signs of injury.     Cervical back: No rigidity.  Skin:    General: Skin is warm and dry.     Coloration: Skin is not jaundiced or pale.  Neurological:     General: No focal deficit present.     Mental Status: She is alert and oriented to  person, place, and time.     ED Results / Procedures / Treatments  Labs (all labs ordered are listed, but only abnormal results are displayed) Labs Reviewed  CBC WITH DIFFERENTIAL/PLATELET - Abnormal; Notable for the following components:      Result Value   Platelets 120 (*)    All other components within normal limits  COMPREHENSIVE METABOLIC PANEL - Abnormal; Notable for the following components:   Creatinine, Ser <0.30 (*)    All other components within normal limits  RESP PANEL BY RT-PCR (FLU A&B, COVID) ARPGX2  HIV ANTIBODY (ROUTINE TESTING W REFLEX)  CBC  BASIC METABOLIC PANEL  I-STAT BETA HCG BLOOD, ED ([facility], [facility], [facility] ONLY)    EKG None  Radiology DG Neck Soft Tissue  Result Date: 11/01/2020 CLINICAL DATA:  Recent aspiration EXAM: NECK SOFT TISSUES - 1+ VIEW COMPARISON:  None. FINDINGS: Tracheostomy tube is noted in satisfactory position. No radiopaque foreign body is seen. Epiglottis and aryepiglottic folds appear unremarkable. No bony abnormality is seen. IMPRESSION: No acute abnormality noted. Electronically Signed   By: Alcide CleverMark  Lukens M.D.   On: 11/01/2020 13:02   DG Chest 1 View  Result Date: 11/01/2020 CLINICAL DATA:  Recent aspiration EXAM: CHEST  1 VIEW COMPARISON:  09/26/2017 FINDINGS: Cardiac shadow is enlarged but stable. Postsurgical changes are again seen. Tracheostomy tube is noted in satisfactory position. No focal infiltrate or effusion is seen. No bony abnormality is noted. IMPRESSION: No acute abnormality noted. Electronically Signed   By: Alcide CleverMark  Lukens M.D.   On: 11/01/2020 13:03    Procedures Procedures (including critical care time)  Medications Ordered in ED Medications  ibuprofen (ADVIL) tablet 400 mg (has no administration in time range)  nitrofurantoin (MACRODANTIN) capsule 50 mg (has no administration in time range)  busPIRone (BUSPAR) tablet 5 mg (has no administration in time range)  imipramine (TOFRANIL) tablet 100 mg (has no administration  in time range)  sertraline (ZOLOFT) tablet 100 mg (has no administration in time range)  bisacodyl (DULCOLAX) suppository 10 mg (has no administration in time range)  enoxaparin (LOVENOX) injection 40 mg (has no administration in time range)  0.9 %  sodium chloride infusion ( Intravenous New Bag/Given 11/01/20 1816)  polyethylene glycol (MIRALAX / GLYCOLAX) packet 17 g (17 g Oral Not Given 11/01/20 1717)  baclofen (LIORESAL) tablet 40 mg (has no administration in time range)  dantrolene (DANTRIUM) capsule 100 mg (has no administration in time range)  fluticasone (FLONASE) 50 MCG/ACT nasal spray 2 spray (2 sprays Each Nare Not Given 11/01/20 1716)  gabapentin (NEURONTIN) capsule 300 mg (has no administration in time range)  sucralfate (CARAFATE) 1 GM/10ML suspension 1 g (1 g Oral Not Given 11/01/20 1716)  topiramate (TOPAMAX) tablet 50 mg (has no administration in time range)  lidocaine (XYLOCAINE) 2 % jelly 1 application (has no administration in time range)  sodium chloride 0.9 % bolus 1,000 mL (0 mLs Intravenous Stopped 11/01/20 1817)    ED Course  I have reviewed the triage vital signs and the nursing notes.  Pertinent labs & imaging results that were available during my care of the patient were reviewed by me and considered in my medical decision making (see chart for details).    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 Jenne PaneBates with tracheal stenosis and pharyngeal dysphagia who presents with concern for dysphagia.  Present for months but  new today drank fluid that came out of stoma.    XR chest and neck without acute abnormalities.  Discussed with Dr. Pollyann Kennedy who recommends esophagram. Discussed with radiology who recommends speech eval initially which may be followed by esophagram if no other abnormalities. (If esophageal stricture, GI if fistula will require  transfer.)  Given pt unable to tolerate po, will admit for IV hydration and further studies. ENT Dr. Pollyann Kennedy to see tomorrow and speech to eval tomorrow.   Final Clinical Impression(s) / ED Diagnoses Final diagnoses:  Dysphagia    Rx / DC Orders ED Discharge Orders    None       Alvira Monday, MD 11/01/20 2157

## 2020-11-01 NOTE — ED Notes (Signed)
Patient transported to X-ray 

## 2020-11-01 NOTE — ED Triage Notes (Signed)
Pt BIB [facility] from home for aspiration the last few days, progressively worse this morning. Attempts to suction, change tube all failed. ENT referred to ED for swallow study, pt has been asking, has not heard from [facility]. Pt has not been able to keep anything down this morning. Denies NVD. Sat night aspirated a pill.  BP 126/64 HR 90 RR 20 SpO2 97% RA Temp 97.3

## 2020-11-01 NOTE — ED Notes (Addendum)
Attempted NG tube placement unsuccessfully, felt resistance and patient states "I heard a crackle, it feels like it could be part of a bone" paging hosptialist to make aware

## 2020-11-01 NOTE — Progress Notes (Deleted)
History and Physical    Veronica Huff:[id number] DOB: [birth year] DOA: 11/01/2020  PCP: Annita Brod, MD   Patient coming from: Home    Chief Complaint: Dysphagia  HPI: Veronica Huff is a 34 y.o. 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 It started last Saturday when she choked a pill.  Patient then is having difficulty swallowing with both solid and liquid food.She is now too scared to take anything by mouth.  When she tried to drink water, the water came through her trach.They even changed the trach at home,but it did not help. She follows with ENT,Dr Jenne Pane.  ENT were planning to do barium swallow as an outpatient with [facility] radiology but they never called her so she came to the emergency department. Patient is bedbound.  Has caregiver at home. There is no report of fever, chills, diarrhea, vomiting, nausea, chest pain, shortness of breath or dysuria. Patient seen and examined at the bedside in the emergency department.  During my evaluation, she was hemodynamically stable.  ED Course: She was hemodynamically stable on presentation.  Lab work showed mild thrombocytopenia which is chronic finding.  ED physician discussed with ENT who recommended admission for dysphagia work-up.  Chest x-ray and x-ray of the neck did not show any acute abnormalities.  Patient wanted to have an NG tube placed for her medications that she take at home.  Review of Systems: As per HPI otherwise 10 point review of systems negative.    Past Medical History:  Diagnosis Date  . Anxiety   . CVA (cerebral infarction)    Ischemic brainstem stroke in 2011 at age 43 due to unknown reasons--quadriplegic after stroke.  . Depression    Zoloft has helped-not having any current depression  . Edema extremities    Feet and legs-mostly in feet  .  Headache(784.0)    Prior to imipramine-none currently  . Pneumonia   . Quadriplegia (HCC)    Secondary to stroke at age 74-has trach-lives with boyfriend-motorized w/c-joystick to chin to drive w/c--I & O cath thru suprapubic stoma - has nursing care 16 hrs daily and boyfriend able to help pt the other hours of day.  . Recurrent UTI (urinary tract infection)    Due to suprapubic stoma, repetitive catheterizations  . Sleep apnea    On ventilator at night -has oxygen but rarely needs unless she has a cold and sats drop  . Stroke [facility])     Past Surgical History:  Procedure Laterality Date  . BLADDER SURGERY  2009,2012  . BRONCHOSCOPY  2006  . CHOLECYSTECTOMY  11/18/2012   Procedure: LAPAROSCOPIC CHOLECYSTECTOMY;  Surgeon: Axel Filler, MD;  Location: [facility] ORS;  Service: General;  Laterality: N/A;  . ESOPHAGOGASTRODUODENOSCOPY    . GASTROSTOMY TUBE PLACEMENT  2000  . ILEOSTOMY  2006  . MICROLARYNGOSCOPY WITH LASER AND BALLOON DILATION N/A 09/27/2016   Procedure: MICROLARYNGOSCOPY WITH LASER AND BALLOON DILATION;  Surgeon: Christia Reading, MD;  Location: [facility] OR;  Service: ENT;  Laterality: N/A;  micro direct laryngoscopy with co2 laser balloon dilation of trachea  . REMOVAL OF GASTROSTOMY TUBE    . SPINE SURGERY  2004,2011  . TENDON REPAIR  2012   Left wrist  . THROAT SURGERY  2004  . TRACHEOSTOMY  2000  . wirst surgery Left   . WISDOM TOOTH EXTRACTION  2008  reports that she has never smoked. She has never used smokeless tobacco. She reports that she does not drink alcohol and does not use drugs.  Allergies  Allergen Reactions  . Bee Venom Shortness Of Breath and Swelling    SHOCK  . Amoxicillin Hives and Itching    Tolerates Keflex, Rocephin Has patient had a PCN reaction causing immediate rash, facial/tongue/throat swelling, SOB or lightheadedness with hypotension: no Has patient had a PCN reaction causing severe rash involving mucus membranes or skin necrosis: no Has patient  had a PCN reaction that required hospitalization: no Has patient had a PCN reaction occurring within the last 10 years: unknown If all of the above answers are "NO", then may proceed with Cephalosporin use.   . Ciprofloxacin Hives and Rash  . Tobramycin Hives and Rash  . Erythromycin Other (See Comments)    UNSPECIFIED REACTION    . Trazodone Other (See Comments)    UNSPECIFIED REACTION  Eyes shake  . Trazodone And Nefazodone     UNSPECIFIED REACTION   . Morphine And Related Other (See Comments)    "GOES CRAZY"   . Vantin [Cefpodoxime] Palpitations    Family History  Problem Relation Age of Onset  . Diabetes Mother   . Hypertension Mother   . Varicose Veins Mother   . Cancer Maternal Aunt        lung  . Diabetes Maternal Grandfather   . Heart disease Maternal Grandfather   . Heart disease Paternal Grandfather   . Other Sister        overdose     Prior to Admission medications   Medication Sig Start Date End Date Taking? Authorizing Provider  baclofen (LIORESAL) 20 MG tablet TAKE TWO TABLETS BY MOUTH FOUR TIMES A DAY Patient taking differently: Take 40 mg by mouth 4 (four) times daily. 10/11/20  Yes Levert Feinstein, MD  bisacodyl (DULCOLAX) 10 MG suppository Place 10 mg rectally every other day as needed for moderate constipation. constipation   Yes [provider]  busPIRone (BUSPAR) 5 MG tablet Take 5 mg by mouth 2 (two) times daily. 09/30/20  Yes [provider]  clotrimazole-betamethasone (LOTRISONE) cream Apply 1 application topically daily as needed (for irritation). On labium 10/28/20  Yes [provider]  dantrolene (DANTRIUM) 100 MG capsule TAKE 1 CAPSULE BY MOUTH 3 TIMES DAILY. NEED TO CALL OFFICE FOR FURTHER REFILLS [phone]. Patient taking differently: Take 100 mg by mouth 3 (three) times daily. 08/31/20  Yes Levert Feinstein, MD  EPIPEN 2-PAK 0.3 MG/0.3ML SOAJ injection Inject 0.3 mg into the muscle daily as needed for anaphylaxis. 05/10/15   Yes [provider]  fluticasone (FLONASE) 50 MCG/ACT nasal spray INSERT 2 SPRAYS IN EACH NOSTRIL EVERY DAY FOR STUFFY NOSE OR DRAINAGE Patient taking differently: Place 2 sprays into both nostrils daily. 08/12/18  Yes Bobbitt, Heywood Iles, MD  gabapentin (NEURONTIN) 300 MG capsule Take 300 mg by mouth 3 (three) times daily.   Yes [provider]  hydrocortisone cream 0.5 % Apply 1 application topically 2 (two) times daily. To hairline and brows   Yes [provider]  ibuprofen (ADVIL) 200 MG tablet Take 400 mg by mouth every 6 (six) hours as needed for fever, headache, mild pain, moderate pain or cramping.   Yes [provider]  imipramine (TOFRANIL) 50 MG tablet TAKE 2 TABLETS (100 MG TOTAL) BY MOUTH AT BEDTIME. Patient taking differently: Take 100 mg by mouth at bedtime. 08/05/20  Yes Levert Feinstein, MD  ketorolac (  ACULAR) 0.5 % ophthalmic solution Place 1 drop into both eyes daily.  04/01/19  Yes [provider]  lactulose (CHRONULAC) 10 GM/15ML solution Take 10 g by mouth daily as needed for mild constipation. 08/06/20  Yes [provider]  levocetirizine (XYZAL) 5 MG tablet TAKE 1 TABLET BY MOUTH EVERY DAY AS NEEDED Patient taking differently: Take 5 mg by mouth every evening. 03/24/19  Yes Bobbitt, Heywood Ilesalph Carter, MD  medroxyPROGESTERone (DEPO-PROVERA) 150 MG/ML injection Inject 150 mg into the muscle every 3 (three) months.  07/09/15  Yes [provider]  Multiple Vitamin (MULTIVITAMIN WITH MINERALS) TABS tablet Take 1 tablet by mouth daily.   Yes [provider]  mupirocin ointment (BACTROBAN) 2 % 1 application by Tracheal Tube route daily. Applies to trach-stoma site   Yes [provider]  nitrofurantoin (MACRODANTIN) 50 MG capsule Take 50 mg by mouth at bedtime.   Yes [provider]  nystatin (MYCOSTATIN) powder Apply 1 application topically 2 (two) times daily as needed (skin irritation). Small amount . Rash    Yes [provider]  polyethylene glycol powder (MIRALAX) 17 GM/SCOOP powder Start taking 1 capful 3 times a day. Slowly cut back as needed until you have normal bowel movements. Patient taking differently: Take 1 Container by mouth daily. 03/13/20  Yes Cardama, Amadeo GarnetPedro Eduardo, MD  sertraline (ZOLOFT) 100 MG tablet Take 100 mg by mouth at bedtime.   Yes [provider]  solifenacin (VESICARE) 10 MG tablet Take 10 mg by mouth daily.   Yes [provider]  sucralfate (CARAFATE) 1 GM/10ML suspension Take 1 g by mouth 3 (three) times daily.    Yes [provider]  topiramate (TOPAMAX) 50 MG tablet TAKE 1 TABLET BY MOUTH AT BEDTIME. Patient taking differently: Take 50 mg by mouth at bedtime. 03/18/20  Yes Levert FeinsteinYan, Yijun, MD    Physical Exam: Vitals:   11/01/20 1230 11/01/20 1345 11/01/20 1500 11/01/20 1530  BP: 114/68 114/75 117/75 114/70  Pulse: 83 94 98 91  Resp: 13 14 16 13   Temp:      TempSrc:      SpO2: 98% 97% 98% 98%  Weight:      Height:        Constitutional: Not in distress, deconditioned, debilitated Vitals:   11/01/20 1230 11/01/20 1345 11/01/20 1500 11/01/20 1530  BP: 114/68 114/75 117/75 114/70  Pulse: 83 94 98 91  Resp: 13 14 16 13   Temp:      TempSrc:      SpO2: 98% 97% 98% 98%  Weight:      Height:       Eyes: PERRL, lids and conjunctivae normal ENMT: Mucous membranes are moist.,trach  Neck: normal, supple, no masses, no thyromegaly Respiratory: clear to auscultation bilaterally, no wheezing, no crackles. Normal respiratory effort. No accessory muscle use.  Cardiovascular: Regular rate and rhythm, no murmurs / rubs / gallops. No extremity edema.  Abdomen: no tenderness, no masses palpated. No hepatosplenomegaly. Bowel sounds positive.  Musculoskeletal: no clubbing / cyanosis.  Strictures on all extremities  skin: no rashes, lesions, ulcers. No induration Neurologic: Quadriplegia   Foley Catheter:None  Labs on Admission: I have  personally reviewed following labs and imaging studies  CBC: Recent Labs  Lab 11/01/20 1346  WBC 4.7  NEUTROABS 2.8  HGB 14.2  HCT 43.9  MCV 92.0  PLT 120*   Basic Metabolic Panel: Recent Labs  Lab 11/01/20 1346  NA 138  K 3.6  CL 102  CO2  25  GLUCOSE 89  BUN 10  CREATININE <0.30*  CALCIUM 8.9   GFR: CrCl cannot be calculated (This lab value cannot be used to calculate CrCl because it is not a number: <0.30). Liver Function Tests: Recent Labs  Lab 11/01/20 1346  AST 18  ALT 16  ALKPHOS 53  BILITOT 0.6  PROT 7.4  ALBUMIN 4.1   No results for input(s): LIPASE, AMYLASE in the last 168 hours. No results for input(s): AMMONIA in the last 168 hours. Coagulation Profile: No results for input(s): INR, PROTIME in the last 168 hours. Cardiac Enzymes: No results for input(s): CKTOTAL, CKMB, CKMBINDEX, TROPONINI in the last 168 hours. BNP (last 3 results) No results for input(s): PROBNP in the last 8760 hours. HbA1C: No results for input(s): HGBA1C in the last 72 hours. CBG: No results for input(s): GLUCAP in the last 168 hours. Lipid Profile: No results for input(s): CHOL, HDL, LDLCALC, TRIG, CHOLHDL, LDLDIRECT in the last 72 hours. Thyroid Function Tests: No results for input(s): TSH, T4TOTAL, FREET4, T3FREE, THYROIDAB in the last 72 hours. Anemia Panel: No results for input(s): VITAMINB12, FOLATE, FERRITIN, TIBC, IRON, RETICCTPCT in the last 72 hours. Urine analysis:    Component Value Date/Time   COLORURINE YELLOW 03/12/2020 2322   APPEARANCEUR CLEAR 03/12/2020 2322   LABSPEC 1.010 03/12/2020 2322   PHURINE 6.0 03/12/2020 2322   GLUCOSEU NEGATIVE 03/12/2020 2322   HGBUR SMALL (A) 03/12/2020 2322   BILIRUBINUR NEGATIVE 03/12/2020 2322   KETONESUR NEGATIVE 03/12/2020 2322   PROTEINUR NEGATIVE 03/12/2020 2322   UROBILINOGEN 0.2 04/27/2014 2314   NITRITE POSITIVE (A) 03/12/2020 2322   LEUKOCYTESUR MODERATE (A) 03/12/2020 2322    Radiological Exams on  Admission: DG Neck Soft Tissue  Result Date: 11/01/2020 CLINICAL DATA:  Recent aspiration EXAM: NECK SOFT TISSUES - 1+ VIEW COMPARISON:  None. FINDINGS: Tracheostomy tube is noted in satisfactory position. No radiopaque foreign body is seen. Epiglottis and aryepiglottic folds appear unremarkable. No bony abnormality is seen. IMPRESSION: No acute abnormality noted. Electronically Signed   By: Alcide Clever M.D.   On: 11/01/2020 13:02   DG Chest 1 View  Result Date: 11/01/2020 CLINICAL DATA:  Recent aspiration EXAM: CHEST  1 VIEW COMPARISON:  09/26/2017 FINDINGS: Cardiac shadow is enlarged but stable. Postsurgical changes are again seen. Tracheostomy tube is noted in satisfactory position. No focal infiltrate or effusion is seen. No bony abnormality is noted. IMPRESSION: No acute abnormality noted. Electronically Signed   By: Alcide Clever M.D.   On: 11/01/2020 13:03     Assessment/Plan Active Problems:   Quadriplegia (HCC)   CVA (cerebral vascular accident) (HCC)   Generalized anxiety disorder   Spastic tetraplegia (HCC)   Chronic respiratory failure (HCC)   Dysphagia   Dysphagia: Patient has difficulty swallowing food and even liquid .One time, she reported that the water that she drink came out from her tracheostomy.  Patient was planned for barium swallow as an outpatient but [facility] radiology never called her so she presented to the emergency department.  Patient admitted for poor oral intake. She follows with ENT as an outpatient.  We have requested ENT consultation Speech therapy consulted Continue gentle IV fluids.  Clear liquid diet ordered but patient is too scared to have anything by mouth.  She wants to place NG tube for her medications.  We will go ahead and put NG tube.  If she is found to have significant dysphagia and if ENT is  also agreeable, we will go ahead and  put a PEG.  Patient is interested on this.  History of nonhemorrhagic CVA: Suffered a brainstem stroke at the  age of 89 with residual spastic quadriplegia, neurogenic bladder.  She is wheelchair-bound.Take botulimun toxin for spasticity.  Also on dantrolene and baclofen.  Constipation: Continue bowel regimen  History of neurogenic bladder: Follows with urology.  Status post augmentation cystoplasty and cath able stoma for home use.  Has history of chronic cystitis.  Depression: On Zoloft  Recurrent UTI: On suppressive antibiotic, takes nitrofurantoin.  Anxiety: Takes imipramine,buspar  Thrombocytopenia: Chronic and stable.  Debility/deconditioning: Lives at home.  Has caregiver at home.  Wheelchair dependent.  Sleep apnea: Uses CPAP machine at home.  We will continue CPAP here.      Severity of Illness: The appropriate patient status for this patient is OBSERVATION.    DVT prophylaxis: Lovenox Code Status: Full Family Communication: Boyfriend at bedside Consults called: ENT called by ED physician     Burnadette Pop MD [facility]  11/01/2020, 4:35 PM

## 2020-11-01 NOTE — ED Notes (Signed)
Pt's caregiver performed in and out catheterization. Tolerated well.

## 2020-11-02 ENCOUNTER — Observation Stay (HOSPITAL_COMMUNITY): Payer: Medicare Other

## 2020-11-02 DIAGNOSIS — Z20822 Contact with and (suspected) exposure to covid-19: Secondary | ICD-10-CM | POA: Diagnosis present

## 2020-11-02 DIAGNOSIS — Z9049 Acquired absence of other specified parts of digestive tract: Secondary | ICD-10-CM | POA: Diagnosis not present

## 2020-11-02 DIAGNOSIS — D696 Thrombocytopenia, unspecified: Secondary | ICD-10-CM | POA: Diagnosis present

## 2020-11-02 DIAGNOSIS — I69398 Other sequelae of cerebral infarction: Secondary | ICD-10-CM | POA: Diagnosis not present

## 2020-11-02 DIAGNOSIS — J398 Other specified diseases of upper respiratory tract: Secondary | ICD-10-CM | POA: Diagnosis present

## 2020-11-02 DIAGNOSIS — Z881 Allergy status to other antibiotic agents status: Secondary | ICD-10-CM | POA: Diagnosis not present

## 2020-11-02 DIAGNOSIS — R1310 Dysphagia, unspecified: Secondary | ICD-10-CM | POA: Diagnosis present

## 2020-11-02 DIAGNOSIS — Z833 Family history of diabetes mellitus: Secondary | ICD-10-CM | POA: Diagnosis not present

## 2020-11-02 DIAGNOSIS — J9610 Chronic respiratory failure, unspecified whether with hypoxia or hypercapnia: Secondary | ICD-10-CM | POA: Diagnosis present

## 2020-11-02 DIAGNOSIS — I69365 Other paralytic syndrome following cerebral infarction, bilateral: Secondary | ICD-10-CM | POA: Diagnosis not present

## 2020-11-02 DIAGNOSIS — Z885 Allergy status to narcotic agent status: Secondary | ICD-10-CM | POA: Diagnosis not present

## 2020-11-02 DIAGNOSIS — Z91030 Bee allergy status: Secondary | ICD-10-CM | POA: Diagnosis not present

## 2020-11-02 DIAGNOSIS — Z888 Allergy status to other drugs, medicaments and biological substances status: Secondary | ICD-10-CM | POA: Diagnosis not present

## 2020-11-02 DIAGNOSIS — Z8249 Family history of ischemic heart disease and other diseases of the circulatory system: Secondary | ICD-10-CM | POA: Diagnosis not present

## 2020-11-02 DIAGNOSIS — Z79899 Other long term (current) drug therapy: Secondary | ICD-10-CM | POA: Diagnosis not present

## 2020-11-02 DIAGNOSIS — Z7401 Bed confinement status: Secondary | ICD-10-CM | POA: Diagnosis not present

## 2020-11-02 DIAGNOSIS — N319 Neuromuscular dysfunction of bladder, unspecified: Secondary | ICD-10-CM | POA: Diagnosis present

## 2020-11-02 DIAGNOSIS — I69891 Dysphagia following other cerebrovascular disease: Secondary | ICD-10-CM | POA: Diagnosis not present

## 2020-11-02 DIAGNOSIS — F32A Depression, unspecified: Secondary | ICD-10-CM | POA: Diagnosis present

## 2020-11-02 DIAGNOSIS — Z880 Allergy status to penicillin: Secondary | ICD-10-CM | POA: Diagnosis not present

## 2020-11-02 DIAGNOSIS — G8250 Quadriplegia, unspecified: Secondary | ICD-10-CM | POA: Diagnosis not present

## 2020-11-02 DIAGNOSIS — Z801 Family history of malignant neoplasm of trachea, bronchus and lung: Secondary | ICD-10-CM | POA: Diagnosis not present

## 2020-11-02 DIAGNOSIS — Z993 Dependence on wheelchair: Secondary | ICD-10-CM | POA: Diagnosis not present

## 2020-11-02 DIAGNOSIS — Z87440 Personal history of urinary (tract) infections: Secondary | ICD-10-CM | POA: Diagnosis not present

## 2020-11-02 DIAGNOSIS — Z930 Tracheostomy status: Secondary | ICD-10-CM | POA: Diagnosis not present

## 2020-11-02 LAB — BASIC METABOLIC PANEL
Anion gap: 11 (ref 5–15)
BUN: 12 mg/dL (ref 6–20)
CO2: 21 mmol/L — ABNORMAL LOW (ref 22–32)
Calcium: 8.7 mg/dL — ABNORMAL LOW (ref 8.9–10.3)
Chloride: 105 mmol/L (ref 98–111)
Creatinine, Ser: <0.3 mg/dL — ABNORMAL LOW (ref 0.44–1.00)
Glucose, Bld: 83 mg/dL (ref 70–99)
Potassium: 3.6 mmol/L (ref 3.5–5.1)
Sodium: 137 mmol/L (ref 135–145)

## 2020-11-02 LAB — CBC
HCT: 40.8 % (ref 36.0–46.0)
Hemoglobin: 13.4 g/dL (ref 12.0–15.0)
MCH: 29.8 pg (ref 26.0–34.0)
MCHC: 32.8 g/dL (ref 30.0–36.0)
MCV: 90.9 fL (ref 80.0–100.0)
Platelets: 72 10*3/uL — ABNORMAL LOW (ref 150–400)
RBC: 4.49 MIL/uL (ref 3.87–5.11)
RDW: 13.6 % (ref 11.5–15.5)
WBC: 4.6 10*3/uL (ref 4.0–10.5)
nRBC: 0 % (ref 0.0–0.2)

## 2020-11-02 LAB — HIV ANTIBODY (ROUTINE TESTING W REFLEX): HIV Screen 4th Generation wRfx: NONREACTIVE

## 2020-11-02 LAB — GLUCOSE, CAPILLARY: Glucose-Capillary: 122 mg/dL — ABNORMAL HIGH (ref 70–99)

## 2020-11-02 MED ORDER — PROCHLORPERAZINE EDISYLATE 10 MG/2ML IJ SOLN
10.0000 mg | Freq: Four times a day (QID) | INTRAMUSCULAR | Status: DC | PRN
  Administered 2020-11-02 (×2): 10 mg via INTRAVENOUS
  Filled 2020-11-02 (×2): qty 2

## 2020-11-02 MED ORDER — LORAZEPAM 2 MG/ML IJ SOLN
0.5000 mg | Freq: Three times a day (TID) | INTRAMUSCULAR | Status: DC | PRN
  Administered 2020-11-02 – 2020-11-03 (×3): 0.5 mg via INTRAVENOUS
  Filled 2020-11-02 (×2): qty 1

## 2020-11-02 MED ORDER — LORAZEPAM 2 MG/ML IJ SOLN
INTRAMUSCULAR | Status: AC
  Filled 2020-11-02: qty 1

## 2020-11-02 MED ORDER — ONDANSETRON HCL 4 MG/2ML IJ SOLN
4.0000 mg | Freq: Four times a day (QID) | INTRAMUSCULAR | Status: DC | PRN
  Administered 2020-11-02 (×2): 4 mg via INTRAVENOUS
  Filled 2020-11-02 (×2): qty 2

## 2020-11-02 NOTE — Progress Notes (Addendum)
Per PT- does not utilize oxygen or humidity during the day. Utilizes home vent at night with drain sponge in place (no drain sponge during day).  PT states she has always had pediatric sized, cuffless trach. This trach is made with no inner cannula.  PT states her boyfriend is bringing in an extra trach for bedside. Encouraged PT to notify staff if extra trach is not in PT room this evening. #4 Shiley cuffless trach is at bedside at this time. RN aware.

## 2020-11-02 NOTE — Progress Notes (Signed)
Modified Barium Swallow Progress Note  Patient Details  Name: Veronica Huff MRN: [id number] Date of Birth: November 08, 1986  Today's Date: 11/02/2020  Modified Barium Swallow completed.  Full report located under Chart Review in the Imaging Section.  Brief recommendations include the following:  Clinical Impression  Patient presents with functional oropharyngeal swallow ability without aspiration or penetration of any consistency tested *thin, nectar, pudding, cracker bolus.  Her swallow was strong and timely without residuals.  She did appear with potentially prominent cricopharyngeus when swallowing pudding barium but this did not impact barium flow.  SLP questions if prominent CP may have contributed to symptom of pill lodging over the weekend.  Pt advisess she drinks more liquids or consumes bread to clear pills that stick in her throat.  PT did not report any overt symptoms of dysphagia during MBS - ie coughing or complain of sensation of residuals.  SLP did not test barium tablet to allow testing during esophagram as needed.  Recommend regular/thin diet as tolerated.  Will follow up x1 to review testing findings and provide compensations that may migitate dysphagia symptoms.  Thanks for this consult.   Swallow Evaluation Recommendations       SLP Diet Recommendations: Regular solids;Thin liquid   Liquid Administration via: Straw   Medication Administration: Other (Comment) (as tolerated)   Supervision: Full assist for feeding (pt has quadriparesis)   Compensations: Slow rate;Small sips/bites   Postural Changes: Remain semi-upright after after feeds/meals (Comment);Seated upright at 90 degrees (upright as able)   Oral Care Recommendations: Oral care BID       Rolena Infante, MS [facility] SLP Acute Rehab Services Office [phone] Pager [phone]   Chales Abrahams 11/02/2020,6:17 PM

## 2020-11-02 NOTE — Progress Notes (Addendum)
PROGRESS NOTE    [name]  DJM:[id number] DOB: Mar 02, 1986 DOA: 11/01/2020 PCP: Annita Brod, MD   Chief Complain:Dysphagia  Brief Narrative: Veronica Huff is a 34 y.o. 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 It started last Saturday when she choked a pill.  Patient then is having difficulty swallowing with both solid and liquid food.She is now too scared to take anything by mouth.  When she tried to drink water, the water came through her trach.They even changed the trach at home,but it did not help. She follows with ENT,Dr Jenne Pane.  ENT were planning to do barium swallow as an outpatient with [facility] radiology but they never called her so she came to the emergency department. Patient was admitted for the evaluation of dysphagia.  ENT/speech therapy following.  Plan is to do barium study today  Assessment & Plan:   Active Problems:   Quadriplegia (HCC)   CVA (cerebral vascular accident) (HCC)   Generalized anxiety disorder   Spastic tetraplegia (HCC)   Chronic respiratory failure (HCC)   Dysphagia   Dysphagia following other cerebrovascular disease    Dysphagia: Patient has difficulty swallowing food and even liquid .One time, she reported that the water that she drink came out from her tracheostomy.  Patient was planned for barium swallow as an outpatient but [facility] radiology never called her so she presented to the emergency department.  Patient admitted for poor oral intake. Speech therapy consulted and following.ENT following Continue gentle IV fluids.  Clear liquid diet ordered but patient is too scared to have anything by mouth.  NG tube attempt was unsuccessful and she does not want to try again.  Plan is to do a barium swallow to check the anatomy of the esophagus.  There could be a stricture,  foreign body.  Possible plan for modified barium swallow after that.  We will make further plan depending upon these tests.  ENT closely following and will provide [place] valuable recommendation.  Patient is interested on PEG placement but will discuss about that if needed after these test reports and after discussion with the speech therapy/ENT  History of nonhemorrhagic CVA: Suffered a brainstem stroke at the age of 41 with residual spastic quadriplegia, neurogenic bladder.  She is wheelchair-bound.Take botulimun toxin for spasticity.  Also on dantrolene and baclofen at home.  Constipation: Continue bowel regimen  History of neurogenic bladder: Follows with urology.  Status post augmentation cystoplasty and cath able stoma for home use.  Has history of chronic cystitis.  Depression: On Zoloft  Recurrent UTI: On suppressive antibiotic, takes nitrofurantoin.  Anxiety: Takes imipramine,buspar  Thrombocytopenia: Chronic and stable.  Platelets count further dropped today.  Will discontinue Lovenox and start on SCD.  Check CBC tomorrow  Debility/deconditioning: Lives at home.  Has caregiver at home.  Wheelchair dependent.  Sleep apnea: Uses CPAP machine at home.  We will continue CPAP here.     Addendum: DG esophagus did not show any stricture,fistula or sign of aspiration.I have avaced the diet to full.I have encouraged the patient for oral intake.Speech therapy/ENT to follow tomorrow for further recommendations.             DVT prophylaxis: SCD Code Status: Full Family Communication: Boyfriend at bedside Status is: Observation  The patient remains OBS appropriate and will d/c before 2 midnights.  Dispo: The patient is from: Home  Anticipated d/c is to: Home              Anticipated d/c date is: 2 days              Patient currently is not medically stable to d/c.     Consultants: ENT  Procedures: None  Antimicrobials:  Anti-infectives (From  admission, onward)   Start     Dose/Rate Route Frequency Ordered Stop   11/01/20 2200  nitrofurantoin (MACRODANTIN) capsule 50 mg        50 mg Oral Daily at bedtime 11/01/20 1630        Subjective: Patient seen and examined at the bedside this morning.  Medically stable during my evaluation.  Overall comfortable.  Too scared to try clear liquid diet.  NG tube placement was unsuccessful yesterday which was intended for medications.  Denies any new complaints today  Objective: Vitals:   11/02/20 1003 11/02/20 1202 11/02/20 1202 11/02/20 1205  BP:  126/60 126/60   Pulse: (!) 109 (!) 115 (!) 117 (!) 117  Resp: 16 20 20 19   Temp:  98.3 F (36.8 C) 98.3 F (36.8 C)   TempSrc:  Oral Oral   SpO2: 100% 93% 93% 94%  Weight:      Height:       No intake or output data in the 24 hours ending 11/02/20 1318 Filed Weights   11/01/20 1218  Weight: 54.4 kg    Examination:  General exam: Deconditioned, debilitated, bedbound HEENT: Trach Respiratory system: Bilateral equal air entry, normal vesicular breath sounds, no wheezes or crackles  Cardiovascular system: S1 & S2 heard, RRR. No JVD, murmurs, rubs, gallops or clicks.  Gastrointestinal system: Abdomen is nondistended, soft and nontender. No organomegaly or masses felt. Normal bowel sounds heard. Central nervous system: Alert and oriented.  Quadriplegic Extremities: Strictures  skin: No rashes, no icterus ,no pallor     Data Reviewed: I have personally reviewed following labs and imaging studies  CBC: Recent Labs  Lab 11/01/20 1346 11/02/20 0606  WBC 4.7 4.6  NEUTROABS 2.8  --   HGB 14.2 13.4  HCT 43.9 40.8  MCV 92.0 90.9  PLT 120* 72*   Basic Metabolic Panel: Recent Labs  Lab 11/01/20 1346 11/02/20 1213  NA 138 137  K 3.6 3.6  CL 102 105  CO2 25 21*  GLUCOSE 89 83  BUN 10 12  CREATININE <0.30* <0.30*  CALCIUM 8.9 8.7*   GFR: CrCl cannot be calculated (This lab value cannot be used to calculate CrCl because it  is not a number: <0.30). Liver Function Tests: Recent Labs  Lab 11/01/20 1346  AST 18  ALT 16  ALKPHOS 53  BILITOT 0.6  PROT 7.4  ALBUMIN 4.1   No results for input(s): LIPASE, AMYLASE in the last 168 hours. No results for input(s): AMMONIA in the last 168 hours. Coagulation Profile: No results for input(s): INR, PROTIME in the last 168 hours. Cardiac Enzymes: No results for input(s): CKTOTAL, CKMB, CKMBINDEX, TROPONINI in the last 168 hours. BNP (last 3 results) No results for input(s): PROBNP in the last 8760 hours. HbA1C: No results for input(s): HGBA1C in the last 72 hours. CBG: No results for input(s): GLUCAP in the last 168 hours. Lipid Profile: No results for input(s): CHOL, HDL, LDLCALC, TRIG, CHOLHDL, LDLDIRECT in the last 72 hours. Thyroid Function Tests: No results for input(s): TSH, T4TOTAL, FREET4, T3FREE, THYROIDAB in the last 72 hours. Anemia Panel: No results for input(s): VITAMINB12, FOLATE, FERRITIN,  TIBC, IRON, RETICCTPCT in the last 72 hours. Sepsis Labs: No results for input(s): PROCALCITON, LATICACIDVEN in the last 168 hours.  Recent Results (from the past 240 hour(s))  Resp Panel by RT-PCR (Flu A&B, Covid) Nasopharyngeal Swab     Status: None   Collection Time: 11/01/20  5:04 PM   Specimen: Nasopharyngeal Swab; Nasopharyngeal(NP) swabs in vial transport medium  Result Value Ref Range Status   SARS Coronavirus 2 by RT PCR NEGATIVE NEGATIVE Final    Comment: (NOTE) SARS-CoV-2 target nucleic acids are NOT DETECTED.  The SARS-CoV-2 RNA is generally detectable in upper respiratory specimens during the acute phase of infection. The lowest concentration of SARS-CoV-2 viral copies this assay can detect is 138 copies/mL. A negative result does not preclude SARS-Cov-2 infection and should not be used as the sole basis for treatment or other patient management decisions. A negative result may occur with  improper specimen collection/handling, submission of  specimen other than nasopharyngeal swab, presence of viral mutation(s) within the areas targeted by this assay, and inadequate number of viral copies(<138 copies/mL). A negative result must be combined with clinical observations, patient history, and epidemiological information. The expected result is Negative.  Fact Sheet for Patients:  BloggerCourse.comhttps://www.fda.gov/media/152166/download  Fact Sheet for Healthcare Providers:  SeriousBroker.ithttps://www.fda.gov/media/152162/download  This test is no t yet approved or cleared by the [place] FDA and  has been authorized for detection and/or diagnosis of SARS-CoV-2 by FDA under an Emergency Use Authorization (EUA). This EUA will remain  in effect (meaning this test can be used) for the duration of the COVID-19 declaration under Section 564(b)(1) of the Act, 21 U.S.C.section 360bbb-3(b)(1), unless the authorization is terminated  or revoked sooner.       Influenza A by PCR NEGATIVE NEGATIVE Final   Influenza B by PCR NEGATIVE NEGATIVE Final    Comment: (NOTE) The Xpert Xpress SARS-CoV-2/FLU/RSV plus assay is intended as an aid in the diagnosis of influenza from Nasopharyngeal swab specimens and should not be used as a sole basis for treatment. Nasal washings and aspirates are unacceptable for Xpert Xpress SARS-CoV-2/FLU/RSV testing.  Fact Sheet for Patients: BloggerCourse.comhttps://www.fda.gov/media/152166/download  Fact Sheet for Healthcare Providers: SeriousBroker.ithttps://www.fda.gov/media/152162/download  This test is not yet approved or cleared by the [place] FDA and has been authorized for detection and/or diagnosis of SARS-CoV-2 by FDA under an Emergency Use Authorization (EUA). This EUA will remain in effect (meaning this test can be used) for the duration of the COVID-19 declaration under Section 564(b)(1) of the Act, 21 U.S.C. section 360bbb-3(b)(1), unless the authorization is terminated or revoked.  Performed at [facility], [address] [place], [place] [address]          Radiology Studies: DG Neck Soft Tissue  Result Date: 11/01/2020 CLINICAL DATA:  Recent aspiration EXAM: NECK SOFT TISSUES - 1+ VIEW COMPARISON:  None. FINDINGS: Tracheostomy tube is noted in satisfactory position. No radiopaque foreign body is seen. Epiglottis and aryepiglottic folds appear unremarkable. No bony abnormality is seen. IMPRESSION: No acute abnormality noted. Electronically Signed   By: Alcide CleverMark  Lukens M.D.   On: 11/01/2020 13:02   DG Chest 1 View  Result Date: 11/01/2020 CLINICAL DATA:  Recent aspiration EXAM: CHEST  1 VIEW COMPARISON:  09/26/2017 FINDINGS: Cardiac shadow is enlarged but stable. Postsurgical changes are again seen. Tracheostomy tube is noted in satisfactory position. No focal infiltrate or effusion is seen. No bony abnormality is noted. IMPRESSION: No acute abnormality noted. Electronically Signed   By: Alcide CleverMark  Lukens  M.D.   On: 11/01/2020 13:03        Scheduled Meds: . baclofen  40 mg Oral QID  . busPIRone  5 mg Oral BID  . dantrolene  100 mg Oral TID  . enoxaparin (LOVENOX) injection  40 mg Subcutaneous Q24H  . fluticasone  2 spray Each Nare Daily  . gabapentin  300 mg Oral TID  . imipramine  100 mg Oral QHS  . lidocaine  1 application Topical Once  . nitrofurantoin  50 mg Oral QHS  . polyethylene glycol  17 g Oral Daily  . sertraline  100 mg Oral QHS  . sucralfate  1 g Oral TID  . topiramate  50 mg Oral QHS   Continuous Infusions: . sodium chloride 75 mL/hr at 11/01/20 1816     LOS: 0 days    Time spent: 25 mins.More than 50% of that time was spent in counseling and/or coordination of care.      Burnadette Pop, MD [facility] P12/14/2021, 1:18 PM

## 2020-11-02 NOTE — Progress Notes (Signed)
Patient arrived to unit A&Ox4. bilateral heels are red. Healing mid sacral stage I. Pt complained of nausea. Hospitalist provider paged to assist.

## 2020-11-02 NOTE — Progress Notes (Signed)
PT denies need for trach suctioning at this time. 

## 2020-11-02 NOTE — H&P (Signed)
History and Physical    Veronica Huff DOB: [birth year] DOA: 11/01/2020  PCP: Annita Brod, MD   Patient coming from: Home    Chief Complaint: Dysphagia  HPI: Veronica Huff is a 34 y.o. 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 It started last Saturday when she choked a pill.  Patient then is having difficulty swallowing with both solid and liquid food.She is now too scared to take anything by mouth.  When she tried to drink water, the water came through her trach.They even changed the trach at home,but it did not help. She follows with ENT,Dr Jenne Pane.  ENT were planning to do barium swallow as an outpatient with [facility] radiology but they never called her so she came to the emergency department. Patient is bedbound.  Has caregiver at home. There is no report of fever, chills, diarrhea, vomiting, nausea, chest pain, shortness of breath or dysuria. Patient seen and examined at the bedside in the emergency department.  During my evaluation, she was hemodynamically stable.  ED Course: She was hemodynamically stable on presentation.  Lab work showed mild thrombocytopenia which is chronic finding.  ED physician discussed with ENT who recommended admission for dysphagia work-up.  Chest x-ray and x-ray of the neck did not show any acute abnormalities.  Patient wanted to have an NG tube placed for her medications that she take at home.  Review of Systems: As per HPI otherwise 10 point review of systems negative.    Past Medical History:  Diagnosis Date  . Anxiety   . CVA (cerebral infarction)    Ischemic brainstem stroke in 2011 at age 43 due to unknown reasons--quadriplegic after stroke.  . Depression    Zoloft has helped-not having any current depression  . Edema extremities    Feet and legs-mostly in feet  .  Headache(784.0)    Prior to imipramine-none currently  . Pneumonia   . Quadriplegia (HCC)    Secondary to stroke at age 74-has trach-lives with boyfriend-motorized w/c-joystick to chin to drive w/c--I & O cath thru suprapubic stoma - has nursing care 16 hrs daily and boyfriend able to help pt the other hours of day.  . Recurrent UTI (urinary tract infection)    Due to suprapubic stoma, repetitive catheterizations  . Sleep apnea    On ventilator at night -has oxygen but rarely needs unless she has a cold and sats drop  . Stroke [facility])     Past Surgical History:  Procedure Laterality Date  . BLADDER SURGERY  2009,2012  . BRONCHOSCOPY  2006  . CHOLECYSTECTOMY  11/18/2012   Procedure: LAPAROSCOPIC CHOLECYSTECTOMY;  Surgeon: Axel Filler, MD;  Location: [facility] ORS;  Service: General;  Laterality: N/A;  . ESOPHAGOGASTRODUODENOSCOPY    . GASTROSTOMY TUBE PLACEMENT  2000  . ILEOSTOMY  2006  . MICROLARYNGOSCOPY WITH LASER AND BALLOON DILATION N/A 09/27/2016   Procedure: MICROLARYNGOSCOPY WITH LASER AND BALLOON DILATION;  Surgeon: Christia Reading, MD;  Location: [facility] OR;  Service: ENT;  Laterality: N/A;  micro direct laryngoscopy with co2 laser balloon dilation of trachea  . REMOVAL OF GASTROSTOMY TUBE    . SPINE SURGERY  2004,2011  . TENDON REPAIR  2012   Left wrist  . THROAT SURGERY  2004  . TRACHEOSTOMY  2000  . wirst surgery Left   . WISDOM TOOTH EXTRACTION  2008  reports that she has never smoked. She has never used smokeless tobacco. She reports that she does not drink alcohol and does not use drugs.  Allergies  Allergen Reactions  . Bee Venom Shortness Of Breath and Swelling    SHOCK  . Amoxicillin Hives and Itching    Tolerates Keflex, Rocephin Has patient had a PCN reaction causing immediate rash, facial/tongue/throat swelling, SOB or lightheadedness with hypotension: no Has patient had a PCN reaction causing severe rash involving mucus membranes or skin necrosis: no Has patient  had a PCN reaction that required hospitalization: no Has patient had a PCN reaction occurring within the last 10 years: unknown If all of the above answers are "NO", then may proceed with Cephalosporin use.   . Ciprofloxacin Hives and Rash  . Tobramycin Hives and Rash  . Erythromycin Other (See Comments)    UNSPECIFIED REACTION    . Trazodone Other (See Comments)    UNSPECIFIED REACTION  Eyes shake  . Trazodone And Nefazodone     UNSPECIFIED REACTION   . Morphine And Related Other (See Comments)    "GOES CRAZY"   . Vantin [Cefpodoxime] Palpitations    Family History  Problem Relation Age of Onset  . Diabetes Mother   . Hypertension Mother   . Varicose Veins Mother   . Cancer Maternal Aunt        lung  . Diabetes Maternal Grandfather   . Heart disease Maternal Grandfather   . Heart disease Paternal Grandfather   . Other Sister        overdose     Prior to Admission medications   Medication Sig Start Date End Date Taking? Authorizing Provider  baclofen (LIORESAL) 20 MG tablet TAKE TWO TABLETS BY MOUTH FOUR TIMES A DAY Patient taking differently: Take 40 mg by mouth 4 (four) times daily. 10/11/20  Yes Levert Feinstein, MD  bisacodyl (DULCOLAX) 10 MG suppository Place 10 mg rectally every other day as needed for moderate constipation. constipation   Yes [provider]  busPIRone (BUSPAR) 5 MG tablet Take 5 mg by mouth 2 (two) times daily. 09/30/20  Yes [provider]  clotrimazole-betamethasone (LOTRISONE) cream Apply 1 application topically daily as needed (for irritation). On labium 10/28/20  Yes [provider]  dantrolene (DANTRIUM) 100 MG capsule TAKE 1 CAPSULE BY MOUTH 3 TIMES DAILY. NEED TO CALL OFFICE FOR FURTHER REFILLS [phone]. Patient taking differently: Take 100 mg by mouth 3 (three) times daily. 08/31/20  Yes Levert Feinstein, MD  EPIPEN 2-PAK 0.3 MG/0.3ML SOAJ injection Inject 0.3 mg into the muscle daily as needed for anaphylaxis. 05/10/15   Yes [provider]  fluticasone (FLONASE) 50 MCG/ACT nasal spray INSERT 2 SPRAYS IN EACH NOSTRIL EVERY DAY FOR STUFFY NOSE OR DRAINAGE Patient taking differently: Place 2 sprays into both nostrils daily. 08/12/18  Yes Bobbitt, Heywood Iles, MD  gabapentin (NEURONTIN) 300 MG capsule Take 300 mg by mouth 3 (three) times daily.   Yes [provider]  hydrocortisone cream 0.5 % Apply 1 application topically 2 (two) times daily. To hairline and brows   Yes [provider]  ibuprofen (ADVIL) 200 MG tablet Take 400 mg by mouth every 6 (six) hours as needed for fever, headache, mild pain, moderate pain or cramping.   Yes [provider]  imipramine (TOFRANIL) 50 MG tablet TAKE 2 TABLETS (100 MG TOTAL) BY MOUTH AT BEDTIME. Patient taking differently: Take 100 mg by mouth at bedtime. 08/05/20  Yes Levert Feinstein, MD  ketorolac (  ACULAR) 0.5 % ophthalmic solution Place 1 drop into both eyes daily.  04/01/19  Yes [provider]  lactulose (CHRONULAC) 10 GM/15ML solution Take 10 g by mouth daily as needed for mild constipation. 08/06/20  Yes [provider]  levocetirizine (XYZAL) 5 MG tablet TAKE 1 TABLET BY MOUTH EVERY DAY AS NEEDED Patient taking differently: Take 5 mg by mouth every evening. 03/24/19  Yes Bobbitt, Heywood Iles, MD  medroxyPROGESTERone (DEPO-PROVERA) 150 MG/ML injection Inject 150 mg into the muscle every 3 (three) months.  07/09/15  Yes [provider]  Multiple Vitamin (MULTIVITAMIN WITH MINERALS) TABS tablet Take 1 tablet by mouth daily.   Yes [provider]  mupirocin ointment (BACTROBAN) 2 % 1 application by Tracheal Tube route daily. Applies to trach-stoma site   Yes [provider]  nitrofurantoin (MACRODANTIN) 50 MG capsule Take 50 mg by mouth at bedtime.   Yes [provider]  nystatin (MYCOSTATIN) powder Apply 1 application topically 2 (two) times daily as needed (skin irritation). Small amount . Rash    Yes [provider]  polyethylene glycol powder (MIRALAX) 17 GM/SCOOP powder Start taking 1 capful 3 times a day. Slowly cut back as needed until you have normal bowel movements. Patient taking differently: Take 1 Container by mouth daily. 03/13/20  Yes Cardama, Amadeo Garnet, MD  sertraline (ZOLOFT) 100 MG tablet Take 100 mg by mouth at bedtime.   Yes [provider]  solifenacin (VESICARE) 10 MG tablet Take 10 mg by mouth daily.   Yes [provider]  sucralfate (CARAFATE) 1 GM/10ML suspension Take 1 g by mouth 3 (three) times daily.    Yes [provider]  topiramate (TOPAMAX) 50 MG tablet TAKE 1 TABLET BY MOUTH AT BEDTIME. Patient taking differently: Take 50 mg by mouth at bedtime. 03/18/20  Yes Levert Feinstein, MD    Physical Exam: Vitals:   11/02/20 1003 11/02/20 1202 11/02/20 1202 11/02/20 1205  BP:  126/60 126/60   Pulse: (!) 109 (!) 115 (!) 117 (!) 117  Resp: 16 20 20 19   Temp:  98.3 F (36.8 C) 98.3 F (36.8 C)   TempSrc:  Oral Oral   SpO2: 100% 93% 93% 94%  Weight:      Height:        Constitutional: Not in distress, deconditioned, debilitated Vitals:   11/02/20 1003 11/02/20 1202 11/02/20 1202 11/02/20 1205  BP:  126/60 126/60   Pulse: (!) 109 (!) 115 (!) 117 (!) 117  Resp: 16 20 20 19   Temp:  98.3 F (36.8 C) 98.3 F (36.8 C)   TempSrc:  Oral Oral   SpO2: 100% 93% 93% 94%  Weight:      Height:       Eyes: PERRL, lids and conjunctivae normal ENMT: Mucous membranes are moist.,trach  Neck: normal, supple, no masses, no thyromegaly Respiratory: clear to auscultation bilaterally, no wheezing, no crackles. Normal respiratory effort. No accessory muscle use.  Cardiovascular: Regular rate and rhythm, no murmurs / rubs / gallops. No extremity edema.  Abdomen: no tenderness, no masses palpated. No hepatosplenomegaly. Bowel sounds positive.  Musculoskeletal: no clubbing / cyanosis.  Strictures on all extremities  skin: no rashes, lesions,  ulcers. No induration Neurologic: Quadriplegia   Foley Catheter:None  Labs on Admission: I have personally reviewed following labs and imaging studies  CBC: Recent Labs  Lab 11/01/20 1346 11/02/20 0606  WBC 4.7 4.6  NEUTROABS 2.8  --   HGB 14.2 13.4  HCT 43.9 40.8  MCV 92.0 90.9  PLT 120* 72*   Basic Metabolic Panel: Recent Labs  Lab 11/01/20 1346 11/02/20 1213  NA 138 137  K 3.6 3.6  CL 102 105  CO2 25 21*  GLUCOSE 89 83  BUN 10 12  CREATININE <0.30* <0.30*  CALCIUM 8.9 8.7*   GFR: CrCl cannot be calculated (This lab value cannot be used to calculate CrCl because it is not a number: <0.30). Liver Function Tests: Recent Labs  Lab 11/01/20 1346  AST 18  ALT 16  ALKPHOS 53  BILITOT 0.6  PROT 7.4  ALBUMIN 4.1   No results for input(s): LIPASE, AMYLASE in the last 168 hours. No results for input(s): AMMONIA in the last 168 hours. Coagulation Profile: No results for input(s): INR, PROTIME in the last 168 hours. Cardiac Enzymes: No results for input(s): CKTOTAL, CKMB, CKMBINDEX, TROPONINI in the last 168 hours. BNP (last 3 results) No results for input(s): PROBNP in the last 8760 hours. HbA1C: No results for input(s): HGBA1C in the last 72 hours. CBG: No results for input(s): GLUCAP in the last 168 hours. Lipid Profile: No results for input(s): CHOL, HDL, LDLCALC, TRIG, CHOLHDL, LDLDIRECT in the last 72 hours. Thyroid Function Tests: No results for input(s): TSH, T4TOTAL, FREET4, T3FREE, THYROIDAB in the last 72 hours. Anemia Panel: No results for input(s): VITAMINB12, FOLATE, FERRITIN, TIBC, IRON, RETICCTPCT in the last 72 hours. Urine analysis:    Component Value Date/Time   COLORURINE YELLOW 03/12/2020 2322   APPEARANCEUR CLEAR 03/12/2020 2322   LABSPEC 1.010 03/12/2020 2322   PHURINE 6.0 03/12/2020 2322   GLUCOSEU NEGATIVE 03/12/2020 2322   HGBUR SMALL (A) 03/12/2020 2322   BILIRUBINUR NEGATIVE 03/12/2020 2322   KETONESUR NEGATIVE 03/12/2020  2322   PROTEINUR NEGATIVE 03/12/2020 2322   UROBILINOGEN 0.2 04/27/2014 2314   NITRITE POSITIVE (A) 03/12/2020 2322   LEUKOCYTESUR MODERATE (A) 03/12/2020 2322    Radiological Exams on Admission: DG Neck Soft Tissue  Result Date: 11/01/2020 CLINICAL DATA:  Recent aspiration EXAM: NECK SOFT TISSUES - 1+ VIEW COMPARISON:  None. FINDINGS: Tracheostomy tube is noted in satisfactory position. No radiopaque foreign body is seen. Epiglottis and aryepiglottic folds appear unremarkable. No bony abnormality is seen. IMPRESSION: No acute abnormality noted. Electronically Signed   By: Alcide CleverMark  Lukens M.D.   On: 11/01/2020 13:02   DG Chest 1 View  Result Date: 11/01/2020 CLINICAL DATA:  Recent aspiration EXAM: CHEST  1 VIEW COMPARISON:  09/26/2017 FINDINGS: Cardiac shadow is enlarged but stable. Postsurgical changes are again seen. Tracheostomy tube is noted in satisfactory position. No focal infiltrate or effusion is seen. No bony abnormality is noted. IMPRESSION: No acute abnormality noted. Electronically Signed   By: Alcide CleverMark  Lukens M.D.   On: 11/01/2020 13:03     Assessment/Plan Active Problems:   Quadriplegia (HCC)   CVA (cerebral vascular accident) (HCC)   Generalized anxiety disorder   Spastic tetraplegia (HCC)   Chronic respiratory failure (HCC)   Dysphagia   Dysphagia following other cerebrovascular disease   Dysphagia: Patient has difficulty swallowing food and even liquid .One time, she reported that the water that she drink came out from her tracheostomy.  Patient was planned [facility] an [facility] so she presented to the emergency department.  Patient admitted for poor oral intake. She follows with ENT as an outpatient.  We have requested ENT consultation Speech therapy consulted Continue gentle IV fluids.  Clear liquid diet ordered but patient is too  scared to have anything by mouth.  She wants to place NG tube for her medications.   We will go ahead and put NG tube.  If she is found to have significant dysphagia and if ENT is  also agreeable, we will go ahead and put a PEG.  Patient is interested on this.  History of nonhemorrhagic CVA: Suffered a brainstem stroke at the age of 81 with residual spastic quadriplegia, neurogenic bladder.  She is wheelchair-bound.Take botulimun toxin for spasticity.  Also on dantrolene and baclofen.  Constipation: Continue bowel regimen  History of neurogenic bladder: Follows with urology.  Status post augmentation cystoplasty and cath able stoma for home use.  Has history of chronic cystitis.  Depression: On Zoloft  Recurrent UTI: On suppressive antibiotic, takes nitrofurantoin.  Anxiety: Takes imipramine,buspar  Thrombocytopenia: Chronic and stable.  Debility/deconditioning: Lives at home.  Has caregiver at home.  Wheelchair dependent.  Sleep apnea: Uses CPAP machine at home.  We will continue CPAP here.      Severity of Illness: The appropriate patient status for this patient is OBSERVATION.    DVT prophylaxis: Lovenox Code Status: Full Family Communication: Boyfriend at bedside Consults called: ENT called by ED physician     Burnadette Pop MD [facility]  11/02/2020, 1:51 PM

## 2020-11-02 NOTE — Progress Notes (Signed)
Hospitalist provider contactedV.O received for patient to have foot sticks for labs.

## 2020-11-02 NOTE — Progress Notes (Signed)
PT denies need for suctioning at this time. 

## 2020-11-02 NOTE — Progress Notes (Signed)
Pt request to hold medications.P.O medications are on hold until after swallow/barium swallow study.

## 2020-11-02 NOTE — Consult Note (Signed)
Reason for Consult: Difficulty swallowing Referring Physician: Burnadette Pop, MD  Veronica Huff is an 34 y.o. female.  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  Last seen in our office by Dr. Jenne Pane about 6 months ago with difficulty swallowing and aspiration symptoms.  Recommendation was made for barium swallow or modified barium swallow which was never performed.  She was admitted to the hospital late yesterday with similar complaints.  There was concern she might have a tracheoesophageal fistula.  By the patient's history she has had intermittent trouble with swallowing and had a pill get stuck in her throat recently.  That was a few days ago and since then she has had no voice.  She has no difficulty breathing.  Sometimes when she drinks liquid comes out of her tracheostomy tube but that has not been a regular occurrence.  She has not been losing weight.  Past Medical History:  Diagnosis Date  . Anxiety   . CVA (cerebral infarction)    Ischemic brainstem stroke in 2011 at age 52 due to unknown reasons--quadriplegic after stroke.  . Depression    Zoloft has helped-not having any current depression  . Edema extremities    Feet and legs-mostly in feet  . Headache(784.0)    Prior to imipramine-none currently  . Pneumonia   . Quadriplegia (HCC)    Secondary to stroke at age 76-has trach-lives with boyfriend-motorized w/c-joystick to chin to drive w/c--I & O cath thru suprapubic stoma - has nursing care 16 hrs daily and boyfriend able to help pt the other hours of day.  . Recurrent UTI (urinary tract infection)    Due to suprapubic stoma, repetitive catheterizations  . Sleep apnea    On ventilator at night -has oxygen but rarely needs unless she has a cold and sats drop  . Stroke [facility])     Past Surgical History:  Procedure Laterality Date  . BLADDER SURGERY  2009,2012  . BRONCHOSCOPY  2006  . CHOLECYSTECTOMY  11/18/2012   Procedure:  LAPAROSCOPIC CHOLECYSTECTOMY;  Surgeon: Axel Filler, MD;  Location: [facility];  Service: General;  Laterality: N/A;  . ESOPHAGOGASTRODUODENOSCOPY    . GASTROSTOMY TUBE PLACEMENT  2000  . ILEOSTOMY  2006  . MICROLARYNGOSCOPY WITH LASER AND BALLOON DILATION N/A 09/27/2016   Procedure: MICROLARYNGOSCOPY WITH LASER AND BALLOON DILATION;  Surgeon: Christia Reading, MD;  Location: [facility] OR;  Service: ENT;  Laterality: N/A;  micro direct laryngoscopy with co2 laser balloon dilation of trachea  . REMOVAL OF GASTROSTOMY TUBE    . SPINE SURGERY  2004,2011  . TENDON REPAIR  2012   Left wrist  . THROAT SURGERY  2004  . TRACHEOSTOMY  2000  . wirst surgery Left   . WISDOM TOOTH EXTRACTION  2008    Family History  Problem Relation Age of Onset  . Diabetes Mother   . Hypertension Mother   . Varicose Veins Mother   . Cancer Maternal Aunt        lung  . Diabetes Maternal Grandfather   . Heart disease Maternal Grandfather   . Heart disease Paternal Grandfather   . Other Sister        overdose    Social History:  reports that she has never smoked. She has never used smokeless tobacco. She reports that she does not drink alcohol and does not use drugs.  Allergies:  Allergies  Allergen Reactions  . Bee Venom Shortness Of Breath and Swelling  SHOCK  . Amoxicillin Hives and Itching    Tolerates Keflex, Rocephin Has patient had a PCN reaction causing immediate rash, facial/tongue/throat swelling, SOB or lightheadedness with hypotension: no Has patient had a PCN reaction causing severe rash involving mucus membranes or skin necrosis: no Has patient had a PCN reaction that required hospitalization: no Has patient had a PCN reaction occurring within the last 10 years: unknown If all of the above answers are "NO", then may proceed with Cephalosporin use.   . Ciprofloxacin Hives and Rash  . Tobramycin Hives and Rash  . Erythromycin Other (See Comments)    UNSPECIFIED REACTION    . Trazodone Other (See  Comments)    UNSPECIFIED REACTION  Eyes shake  . Trazodone And Nefazodone     UNSPECIFIED REACTION   . Morphine And Related Other (See Comments)    "GOES CRAZY"   . Vantin [Cefpodoxime] Palpitations    Medications: Reviewed  Results for orders placed or performed during the hospital encounter of 11/01/20 (from the past 48 hour(s))  CBC with Differential     Status: Abnormal   Collection Time: 11/01/20  1:46 PM  Result Value Ref Range   WBC 4.7 4.0 - 10.5 K/uL   RBC 4.77 3.87 - 5.11 MIL/uL   Hemoglobin 14.2 12.0 - 15.0 g/dL   HCT 40.9 81.1 - 91.4 %   MCV 92.0 80.0 - 100.0 fL   MCH 29.8 26.0 - 34.0 pg   MCHC 32.3 30.0 - 36.0 g/dL   RDW 78.2 95.6 - 21.3 %   Platelets 120 (L) 150 - 400 K/uL    Comment: REPEATED TO VERIFY PLATELET COUNT CONFIRMED BY SMEAR SPECIMEN CHECKED FOR CLOTS Immature Platelet Fraction may be clinically indicated, consider ordering this additional test [id number]    nRBC 0.0 0.0 - 0.2 %   Neutrophils Relative % 60 %   Neutro Abs 2.8 1.7 - 7.7 K/uL   Lymphocytes Relative 32 %   Lymphs Abs 1.5 0.7 - 4.0 K/uL   Monocytes Relative 8 %   Monocytes Absolute 0.4 0.1 - 1.0 K/uL   Eosinophils Relative 0 %   Eosinophils Absolute 0.0 0.0 - 0.5 K/uL   Basophils Relative 0 %   Basophils Absolute 0.0 0.0 - 0.1 K/uL   Immature Granulocytes 0 %   Abs Immature Granulocytes 0.01 0.00 - 0.07 K/uL    Comment: Performed at [facility], [address], [place], [place] [address]  Comprehensive metabolic panel     Status: Abnormal   Collection Time: 11/01/20  1:46 PM  Result Value Ref Range   Sodium 138 135 - 145 mmol/L   Potassium 3.6 3.5 - 5.1 mmol/L   Chloride 102 98 - 111 mmol/L   CO2 25 22 - 32 mmol/L   Glucose, Bld 89 70 - 99 mg/dL    Comment: Glucose reference range applies only to samples taken after fasting for at least 8 hours.   BUN 10 6 - 20 mg/dL   Creatinine, Ser <2.84 (L) 0.44 - 1.00 mg/dL   Calcium 8.9 8.9 - 13.2 mg/dL   Total  Protein 7.4 6.5 - 8.1 g/dL   Albumin 4.1 3.5 - 5.0 g/dL   AST 18 15 - 41 U/L   ALT 16 0 - 44 U/L   Alkaline Phosphatase 53 38 - 126 U/L   Total Bilirubin 0.6 0.3 - 1.2 mg/dL   GFR, Estimated NOT CALCULATED >60 mL/min    Comment: (NOTE) Calculated using the CKD-EPI Creatinine  Equation (2021)    Anion gap 11 5 - 15    Comment: Performed at [facility], [address] [place], [place] [address]  I-Stat Beta hCG blood, ED ([facility], [facility], [facility] only)     Status: None   Collection Time: 11/01/20  1:51 PM  Result Value Ref Range   I-stat hCG, quantitative <5.0 <5 mIU/mL   Comment 3            Comment:   GEST. AGE      CONC.  (mIU/mL)   <=1 WEEK        5 - 50     2 WEEKS       50 - 500     3 WEEKS       100 - 10,000     4 WEEKS     1,000 - 30,000        FEMALE AND NON-PREGNANT FEMALE:     LESS THAN 5 mIU/mL   Resp Panel by RT-PCR (Flu A&B, Covid) Nasopharyngeal Swab     Status: None   Collection Time: 11/01/20  5:04 PM   Specimen: Nasopharyngeal Swab; Nasopharyngeal(NP) swabs in vial transport medium  Result Value Ref Range   SARS Coronavirus 2 by RT PCR NEGATIVE NEGATIVE    Comment: (NOTE) SARS-CoV-2 target nucleic acids are NOT DETECTED.  The SARS-CoV-2 RNA is generally detectable in upper respiratory specimens during the acute phase of infection. The lowest concentration of SARS-CoV-2 viral copies this assay can detect is 138 copies/mL. A negative result does not preclude SARS-Cov-2 infection and should not be used as the sole basis for treatment or other patient management decisions. A negative result may occur with  improper specimen collection/handling, submission of specimen other than nasopharyngeal swab, presence of viral mutation(s) within the areas targeted by this assay, and inadequate number of viral copies(<138 copies/mL). A negative result must be combined with clinical observations, patient history, and epidemiological information. The expected result is  Negative.  Fact Sheet for Patients:  BloggerCourse.com  Fact Sheet for Healthcare Providers:  SeriousBroker.it  This test is no t yet approved or cleared by the [place] FDA and  has been authorized for detection and/or diagnosis of SARS-CoV-2 by FDA under an Emergency Use Authorization (EUA). This EUA will remain  in effect (meaning this test can be used) for the duration of the COVID-19 declaration under Section 564(b)(1) of the Act, 21 U.S.C.section 360bbb-3(b)(1), unless the authorization is terminated  or revoked sooner.       Influenza A by PCR NEGATIVE NEGATIVE   Influenza B by PCR NEGATIVE NEGATIVE    Comment: (NOTE) The Xpert Xpress SARS-CoV-2/FLU/RSV plus assay is intended as an aid in the diagnosis of influenza from Nasopharyngeal swab specimens and should not be used as a sole basis for treatment. Nasal washings and aspirates are unacceptable for Xpert Xpress SARS-CoV-2/FLU/RSV testing.  Fact Sheet for Patients: BloggerCourse.com  Fact Sheet for Healthcare Providers: SeriousBroker.it  This test is not yet approved or cleared by the [place] FDA and has been authorized for detection and/or diagnosis of SARS-CoV-2 by FDA under an Emergency Use Authorization (EUA). This EUA will remain in effect (meaning this test can be used) for the duration of the COVID-19 declaration under Section 564(b)(1) of the Act, 21 U.S.C. section 360bbb-3(b)(1), unless the authorization is terminated or revoked.  Performed at [facility], [address] [place], [place] [address]   HIV Antibody (routine testing w rflx)  Status: None   Collection Time: 11/01/20  6:18 PM  Result Value Ref Range   HIV Screen 4th Generation wRfx Non Reactive Non Reactive    Comment: Performed at [facility] [place] [facility], [address], [place], [place] [address]  CBC      Status: Abnormal   Collection Time: 11/02/20  6:06 AM  Result Value Ref Range   WBC 4.6 4.0 - 10.5 K/uL   RBC 4.49 3.87 - 5.11 MIL/uL   Hemoglobin 13.4 12.0 - 15.0 g/dL   HCT 29.5 62.1 - 30.8 %   MCV 90.9 80.0 - 100.0 fL   MCH 29.8 26.0 - 34.0 pg   MCHC 32.8 30.0 - 36.0 g/dL   RDW 65.7 84.6 - 96.2 %   Platelets 72 (L) 150 - 400 K/uL    Comment: REPEATED TO VERIFY PLATELET COUNT CONFIRMED BY SMEAR SPECIMEN CHECKED FOR CLOTS Immature Platelet Fraction may be clinically indicated, consider ordering this additional test [id number]    nRBC 0.0 0.0 - 0.2 %    Comment: Performed at [facility], [address], [place], [place] [address]    DG Neck Soft Tissue  Result Date: 11/01/2020 CLINICAL DATA:  Recent aspiration EXAM: NECK SOFT TISSUES - 1+ VIEW COMPARISON:  None. FINDINGS: Tracheostomy tube is noted in satisfactory position. No radiopaque foreign body is seen. Epiglottis and aryepiglottic folds appear unremarkable. No bony abnormality is seen. IMPRESSION: No acute abnormality noted. Electronically Signed   By: Alcide Clever M.D.   On: 11/01/2020 13:02   DG Chest 1 View  Result Date: 11/01/2020 CLINICAL DATA:  Recent aspiration EXAM: CHEST  1 VIEW COMPARISON:  09/26/2017 FINDINGS: Cardiac shadow is enlarged but stable. Postsurgical changes are again seen. Tracheostomy tube is noted in satisfactory position. No focal infiltrate or effusion is seen. No bony abnormality is noted. IMPRESSION: No acute abnormality noted. Electronically Signed   By: Alcide Clever M.D.   On: 11/01/2020 13:03    UVO:[id number] except as listed in admit H&P  Blood pressure 133/72, pulse (!) 109, temperature 98.4 F (36.9 C), temperature source Oral, resp. rate 16, height 5\' 2"  (1.575 m), weight 54.4 kg, SpO2 100 %.  PHYSICAL EXAM: Overall appearance: Lying in bed in no distress.  Her voice is very weak. Head:  Normocephalic, atraumatic. Ears: External ears look healthy. Nose: External  nose is healthy in appearance. Internal nasal exam free of any lesions or obstruction. Oral Cavity/Pharynx:  There are no mucosal lesions or masses identified. Larynx/Hypopharynx: Deferred Neuro:  No identifiable neurologic deficits. Neck: No palpable neck masses.  Pediatric 4.5 tracheostomy in place without any signs of infection.  Studies Reviewed: none  Procedures: none   Assessment/Plan: Chronic difficulty swallowing.  We need a barium swallow to check the anatomy of the esophagus.  It is possible she could have a stricture, T-E fistula is very unlikely.  It is also possible she could have a foreign body tumor or meat impaction in her esophagus.  All of these things can make it difficult for her to swallow and cause her to aspirate and have liquid and food come out of the tracheostomy.  Depending on the findings, she will likely also need a modified barium swallow to check the functioning of her larynx and her ability to clear her airway.  This will also assess better for aspiration.  Following these 2 measures depending on the findings, either GI endoscopy, other imaging, swallowing therapy, gastrostomy tube, are all possibilities.  11/02/2020,  10:27 AM

## 2020-11-02 NOTE — Plan of Care (Signed)
Call button and personal belongings are within reach. Pt is unable to utilize, care giver at bedside to assist. Caregiver to call for assistance.

## 2020-11-02 NOTE — Evaluation (Signed)
SLP Cancellation Note  Patient Details Name: Veronica Huff MRN: [id number] DOB: December 09, 1985   Cancelled treatment:       Reason Eval/Treat Not Completed: Other (comment) (pt was on schedule for MBS at 0830, unfortuantely she was unable to come at that time, thus test will have to be deferred until approx 1400)   Chales Abrahams 11/02/2020, 8:52 AM   Rolena Infante, MS [facility] SLP Acute Rehab Services Office [phone] Pager [phone]

## 2020-11-03 DIAGNOSIS — I69891 Dysphagia following other cerebrovascular disease: Principal | ICD-10-CM

## 2020-11-03 DIAGNOSIS — G8250 Quadriplegia, unspecified: Secondary | ICD-10-CM

## 2020-11-03 LAB — CBC WITH DIFFERENTIAL/PLATELET
Abs Immature Granulocytes: 0.03 10*3/uL (ref 0.00–0.07)
Basophils Absolute: 0 10*3/uL (ref 0.0–0.1)
Basophils Relative: 0 %
Eosinophils Absolute: 0 10*3/uL (ref 0.0–0.5)
Eosinophils Relative: 0 %
HCT: 37.2 % (ref 36.0–46.0)
Hemoglobin: 12.2 g/dL (ref 12.0–15.0)
Immature Granulocytes: 1 %
Lymphocytes Relative: 25 %
Lymphs Abs: 1.6 10*3/uL (ref 0.7–4.0)
MCH: 29.9 pg (ref 26.0–34.0)
MCHC: 32.8 g/dL (ref 30.0–36.0)
MCV: 91.2 fL (ref 80.0–100.0)
Monocytes Absolute: 0.7 10*3/uL (ref 0.1–1.0)
Monocytes Relative: 10 %
Neutro Abs: 4.1 10*3/uL (ref 1.7–7.7)
Neutrophils Relative %: 64 %
Platelets: 106 10*3/uL — ABNORMAL LOW (ref 150–400)
RBC: 4.08 MIL/uL (ref 3.87–5.11)
RDW: 13.6 % (ref 11.5–15.5)
WBC: 6.3 10*3/uL (ref 4.0–10.5)
nRBC: 0 % (ref 0.0–0.2)

## 2020-11-03 MED ORDER — ZOLPIDEM TARTRATE 5 MG PO TABS: 5.0000 mg | ORAL_TABLET | Freq: Every evening | ORAL | Status: DC | PRN

## 2020-11-03 MED ORDER — PROCHLORPERAZINE EDISYLATE 10 MG/2ML IJ SOLN: 10.0000 mg | Freq: Four times a day (QID) | INTRAMUSCULAR | Status: DC | PRN

## 2020-11-03 MED ORDER — ONDANSETRON HCL 4 MG/2ML IJ SOLN: 4.0000 mg | Freq: Four times a day (QID) | INTRAMUSCULAR | Status: DC | PRN

## 2020-11-03 NOTE — Progress Notes (Signed)
ENT follow-up.  Barium swallow reviewed.  There is no anatomic obstruction or defect and no evidence of TE fistula.  Modified barium swallow report reviewed.  Recommendations made by speech pathology.  No further intervention needed on our part.  Call if needed.

## 2020-11-03 NOTE — TOC Transition Note (Addendum)
Transition of Care [facility]) - CM/SW Discharge Note   Patient Details  Name: Veronica Huff MRN: [id number] Date of Birth: [birth year]  [facility] of [facility]) CM/SW Contact:  Darleene Cleaver, LCSW Phone Number: 11/03/2020, 4:25 PM   Clinical Narrative:    Patient will be discharging back home via EMS.  Patient has caregivers 16 hours a day, and her boyfriend helps when caregivers are not available.  Patient informed bedside nurse that she will need ambulance transport back home.  CSW arranged for [facility] to transport patient home.  Patient has a trach and is also quadriplegic.  CSW signing off   Final next level of care: Home/Self Care Barriers to Discharge: Barriers Resolved   Patient Goals and CMS Choice Patient states their goals for this hospitalization and ongoing recovery are:: To return back home      Discharge Placement  Patient to discharge back home.                     Discharge Plan and Services                                     Social Determinants of Health (SDOH) Interventions     Readmission Risk Interventions No flowsheet data found.

## 2020-11-03 NOTE — Progress Notes (Signed)
°   11/02/20 1718  Assess: MEWS Score  Temp 99.1 F (37.3 C)  BP 121/75  Pulse Rate (!) 124  Resp (!) 21  SpO2 95 %  Assess: MEWS Score  MEWS Temp 0  MEWS Systolic 0  MEWS Pulse 2  MEWS RR 1  MEWS LOC 0  MEWS Score 3  MEWS Score Color Yellow  Assess: if the MEWS score is Yellow or Red  Were vital signs taken at a resting state? Yes  Focused Assessment No change from prior assessment  Early Detection of Sepsis Score *See Row Information* Low  MEWS guidelines implemented *See Row Information* Yes  Treat  MEWS Interventions Other (Comment) (Assesed)  Pain Scale 0-10  Pain Score 2  Pain Type Acute pain  Pain Location Hand  Pain Orientation Right  Pain Descriptors / Indicators Aching  Pain Frequency Rarely  Pain Onset Unable to tell  Patients Stated Pain Goal 0  Pain Intervention(s) Refused  Multiple Pain Sites No  Complains of Nausea /  Vomiting  Interventions Medication (see MAR)  Take Vital Signs  Increase Vital Sign Frequency  Yellow: Q 2hr X 2 then Q 4hr X 2, if remains yellow, continue Q 4hrs  Escalate  MEWS: Escalate Yellow: discuss with charge nurse/RN and consider discussing with provider and RRT  Notify: Charge Nurse/RN  Name of Charge Nurse/RN Notified Sophia Haynes Dage RN  Date Charge Nurse/RN Notified 11/02/20  Time Charge Nurse/RN Notified 1730

## 2020-11-03 NOTE — Discharge Summary (Signed)
Physician Discharge Summary  CHANDLAR STAEBELL IRW:[id number] DOB: [birth year] DOA: 11/01/2020  PCP: Annita Brod, MD  Admit date: 11/01/2020 Discharge date: 11/03/2020  Admitted From: Home Disposition:  Home  Recommendations for Outpatient Follow-up:  1. Follow up with PCP in 1-2 weeks  Discharge Condition:Stable CODE STATUS:Full Diet recommendation: Regular   Brief/Interim Summary: 34 y.o.femalewith medical history significant ofnonhemorrhagic CVAat the age of18, quadriparesis, status post tracheostomy, spasticity who presented from home due to persistent dysphagia, poor oral intake.It started lastSaturday when she choked a pill. Patient then ishaving difficulty swallowing with both solid and liquid food.She is now too scared to take anything by mouth.When she tried to drink water, the water came through her trach.They even changed the trach at home,but it did not help. She follows with ENT,Dr Jenne Pane. ENT were planning to do barium swallow as an outpatient with [facility] radiology but they never called herso she came to the emergency department. Patient was admitted for the evaluation of dysphagia.  ENT/speech therapy following.  Discharge Diagnoses:  Active Problems:   Quadriplegia (HCC)   CVA (cerebral vascular accident) (HCC)   Generalized anxiety disorder   Spastic tetraplegia (HCC)   Chronic respiratory failure (HCC)   Dysphagia   Dysphagia following other cerebrovascular disease   Dysphagia: -Patient has difficulty swallowing food and even liquid .One time,she reported that the water that she drink came out from her tracheostomy.Patient was planned for barium swallow as an outpatient but [facility] radiology never called her so shepresented to the emergency department. Patient admitted for poor oral intake. Speech therapy consulted and ENT was following Given gentle IV fluids. -Pt underwent unremarkable barium swallow  -Per ENT, advance diet. ENT had  since signed off  History of nonhemorrhagic CVA: -Suffered a brainstem stroke at the age of 85 with residual spastic quadriplegia, neurogenic bladder. She is wheelchair-bound.Take botulimun toxin for spasticity.Also ondantrolene and baclofen at home.  Constipation: Continued bowel regimen  History of neurogenic bladder:Follows with urology. Status post augmentation cystoplasty and cath able stoma for home use. Has history of chronic cystitis.  Depression:On Zoloft  Recurrent UTI:On suppressive antibiotic, takes nitrofurantoin.  Anxiety:Takes imipramine,buspar  Thrombocytopenia: Chronic and stable.  Platelets count further dropped today.  Debility/deconditioning:Lives at home. Has caregiver at home. Wheelchair dependent.  Sleep apnea:continue CPAP as tolerated    Discharge Instructions   Allergies as of 11/03/2020      Reactions   Bee Venom Shortness Of Breath, Swelling   SHOCK   Amoxicillin Hives, Itching   Tolerates Keflex, Rocephin Has patient had a PCN reaction causing immediate rash, facial/tongue/throat swelling, SOB or lightheadedness with hypotension: no Has patient had a PCN reaction causing severe rash involving mucus membranes or skin necrosis: no Has patient had a PCN reaction that required hospitalization: no Has patient had a PCN reaction occurring within the last 10 years: unknown If all of the above answers are "NO", then may proceed with Cephalosporin use.   Ciprofloxacin Hives, Rash   Tobramycin Hives, Rash   Erythromycin Other (See Comments)   UNSPECIFIED REACTION    Trazodone Other (See Comments)   UNSPECIFIED REACTION  Eyes shake   Trazodone And Nefazodone    UNSPECIFIED REACTION    Morphine And Related Other (See Comments)   "GOES CRAZY"   Vantin [cefpodoxime] Palpitations      Medication List    TAKE these medications   baclofen 20 MG tablet Commonly known as: LIORESAL TAKE TWO TABLETS BY MOUTH FOUR TIMES A DAY What  changed:  how much to take  how to take this  when to take this  additional instructions   bisacodyl 10 MG suppository Commonly known as: DULCOLAX Place 10 mg rectally every other day as needed for moderate constipation. constipation   busPIRone 5 MG tablet Commonly known as: BUSPAR Take 5 mg by mouth 2 (two) times daily.   clotrimazole-betamethasone cream Commonly known as: LOTRISONE Apply 1 application topically daily as needed (for irritation). On labium   dantrolene 100 MG capsule Commonly known as: DANTRIUM TAKE 1 CAPSULE BY MOUTH 3 TIMES DAILY. NEED TO CALL OFFICE FOR FURTHER REFILLS [phone]. What changed:   how much to take  how to take this  when to take this  additional instructions   EpiPen 2-Pak 0.3 mg/0.3 mL Soaj injection Generic drug: EPINEPHrine Inject 0.3 mg into the muscle daily as needed for anaphylaxis.   fluticasone 50 MCG/ACT nasal spray Commonly known as: FLONASE INSERT 2 SPRAYS IN EACH NOSTRIL EVERY DAY FOR STUFFY NOSE OR DRAINAGE What changed: See the new instructions.   gabapentin 300 MG capsule Commonly known as: NEURONTIN Take 300 mg by mouth 3 (three) times daily.   hydrocortisone cream 0.5 % Apply 1 application topically 2 (two) times daily. To hairline and brows   ibuprofen 200 MG tablet Commonly known as: ADVIL Take 400 mg by mouth every 6 (six) hours as needed for fever, headache, mild pain, moderate pain or cramping.   imipramine 50 MG tablet Commonly known as: TOFRANIL TAKE 2 TABLETS (100 MG TOTAL) BY MOUTH AT BEDTIME. What changed:   how much to take  how to take this  when to take this  additional instructions   ketorolac 0.5 % ophthalmic solution Commonly known as: ACULAR Place 1 drop into both eyes daily.   lactulose 10 GM/15ML solution Commonly known as: CHRONULAC Take 10 g by mouth daily as needed for mild constipation.   levocetirizine 5 MG tablet Commonly known as: XYZAL TAKE 1 TABLET BY  MOUTH EVERY DAY AS NEEDED What changed: when to take this   medroxyPROGESTERone 150 MG/ML injection Commonly known as: DEPO-PROVERA Inject 150 mg into the muscle every 3 (three) months.   multivitamin with minerals Tabs tablet Take 1 tablet by mouth daily.   mupirocin ointment 2 % Commonly known as: BACTROBAN 1 application by Tracheal Tube route daily. Applies to trach-stoma site   nitrofurantoin 50 MG capsule Commonly known as: MACRODANTIN Take 50 mg by mouth at bedtime.   nystatin powder Commonly known as: MYCOSTATIN/NYSTOP Apply 1 application topically 2 (two) times daily as needed (skin irritation). Small amount . Rash   polyethylene glycol powder 17 GM/SCOOP powder Commonly known as: MiraLax Start taking 1 capful 3 times a day. Slowly cut back as needed until you have normal bowel movements. What changed:   how much to take  how to take this  when to take this  additional instructions   sertraline 100 MG tablet Commonly known as: ZOLOFT Take 100 mg by mouth at bedtime.   solifenacin 10 MG tablet Commonly known as: VESICARE Take 10 mg by mouth daily.   sucralfate 1 GM/10ML suspension Commonly known as: CARAFATE Take 1 g by mouth 3 (three) times daily.   topiramate 50 MG tablet Commonly known as: TOPAMAX TAKE 1 TABLET BY MOUTH AT BEDTIME.       Follow-up Information    Annita Brod, MD. Schedule an appointment as soon as possible for a visit in 2 week(s).   Specialty: Internal Medicine Contact  information: [address] [place] [address] [phone]              Allergies  Allergen Reactions  . Bee Venom Shortness Of Breath and Swelling    SHOCK  . Amoxicillin Hives and Itching    Tolerates Keflex, Rocephin Has patient had a PCN reaction causing immediate rash, facial/tongue/throat swelling, SOB or lightheadedness with hypotension: no Has patient had a PCN reaction causing severe rash involving mucus membranes or skin necrosis:  no Has patient had a PCN reaction that required hospitalization: no Has patient had a PCN reaction occurring within the last 10 years: unknown If all of the above answers are "NO", then may proceed with Cephalosporin use.   . Ciprofloxacin Hives and Rash  . Tobramycin Hives and Rash  . Erythromycin Other (See Comments)    UNSPECIFIED REACTION    . Trazodone Other (See Comments)    UNSPECIFIED REACTION  Eyes shake  . Trazodone And Nefazodone     UNSPECIFIED REACTION   . Morphine And Related Other (See Comments)    "GOES CRAZY"   . Vantin [Cefpodoxime] Palpitations    Consultations:  ENT  Procedures/Studies: DG Neck Soft Tissue  Result Date: 11/01/2020 CLINICAL DATA:  Recent aspiration EXAM: NECK SOFT TISSUES - 1+ VIEW COMPARISON:  None. FINDINGS: Tracheostomy tube is noted in satisfactory position. No radiopaque foreign body is seen. Epiglottis and aryepiglottic folds appear unremarkable. No bony abnormality is seen. IMPRESSION: No acute abnormality noted. Electronically Signed   By: Alcide CleverMark  Lukens M.D.   On: 11/01/2020 13:02   DG Chest 1 View  Result Date: 11/01/2020 CLINICAL DATA:  Recent aspiration EXAM: CHEST  1 VIEW COMPARISON:  09/26/2017 FINDINGS: Cardiac shadow is enlarged but stable. Postsurgical changes are again seen. Tracheostomy tube is noted in satisfactory position. No focal infiltrate or effusion is seen. No bony abnormality is noted. IMPRESSION: No acute abnormality noted. Electronically Signed   By: Alcide CleverMark  Lukens M.D.   On: 11/01/2020 13:03   DG Swallowing Func-Speech Pathology  Result Date: 11/02/2020 Objective Swallowing Evaluation: Type of Study: MBS-Modified Barium Swallow Study  Patient Details Name: Jeronimo Normananda R Klare MRN: [id number] Date of Birth: January 06, 1986 Today's Date: 11/02/2020 Time: SLP Start Time (ACUTE ONLY): 1410 -SLP Stop Time (ACUTE ONLY): 1425 SLP Time Calculation (min) (ACUTE ONLY): 15 min Past Medical History: Past Medical History: Diagnosis Date  . Anxiety  . CVA (cerebral infarction)   Ischemic brainstem stroke in 2011 at age 34 due to unknown reasons--quadriplegic after stroke. . Depression   Zoloft has helped-not having any current depression . Edema extremities   Feet and legs-mostly in feet . Headache(784.0)   Prior to imipramine-none currently . Pneumonia  . Quadriplegia (HCC)   Secondary to stroke at age 34-has trach-lives with boyfriend-motorized w/c-joystick to chin to drive w/c--I & O cath thru suprapubic stoma - has nursing care 16 hrs daily and boyfriend able to help pt the other hours of day. . Recurrent UTI (urinary tract infection)   Due to suprapubic stoma, repetitive catheterizations . Sleep apnea   On ventilator at night -has oxygen but rarely needs unless she has a cold and sats drop . Stroke [facility](HCC)  Past Surgical History: Past Surgical History: Procedure Laterality Date . BLADDER SURGERY  2009,2012 . BRONCHOSCOPY  2006 . CHOLECYSTECTOMY  11/18/2012  Procedure: LAPAROSCOPIC CHOLECYSTECTOMY;  Surgeon: Axel FillerArmando Ramirez, MD;  Location: [facility] ORS;  Service: General;  Laterality: N/A; . ESOPHAGOGASTRODUODENOSCOPY   . GASTROSTOMY TUBE PLACEMENT  2000 . ILEOSTOMY  2006 . MICROLARYNGOSCOPY WITH LASER AND BALLOON DILATION N/A 09/27/2016  Procedure: MICROLARYNGOSCOPY WITH LASER AND BALLOON DILATION;  Surgeon: Christia Reading, MD;  Location: [facility] OR;  Service: ENT;  Laterality: N/A;  micro direct laryngoscopy with co2 laser balloon dilation of trachea . REMOVAL OF GASTROSTOMY TUBE   . SPINE SURGERY  2004,2011 . TENDON REPAIR  2012  Left wrist . THROAT SURGERY  2004 . TRACHEOSTOMY  2000 . wirst surgery Left  . WISDOM TOOTH EXTRACTION  2008 HPI: Pt is a 34 yo female adm to [facility] due to worsening dysphagia since Saturday.  Per notes, pt was scheduled with [place] radiology for barium swallow however they never called to schedule. Saturday, she was attempting to swallow a pill and feels it was sharp and lodged in her throat and since then has had a hoarse voice which is  new since Saturday. Pt reported to MD that she drank water 11/01/2020 and it came directly out of her stoma.  Pt is s/p Fiberoptic exam that per MD notes showed prominent granulation circumferentially at the level of the trach tube.  Md notes indicate pt has not been diagnosed with pneumonia or hospitalized. Pt reported suctioning and changing trach was not helpful.  No other symptoms including no drooling, dyspnea, fever, chest pain, n/v/d.   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   Pt also with h/o depression, neurogenic bowel, recurrent use of ABX, sacral decub, vent use at night. CXR and DG neck negative for acute findings.  Today pt reported to this SLP that she drank water without tracheal loss.  Subjective: pt awake in chair Assessment / Plan / Recommendation CHL IP CLINICAL IMPRESSIONS 11/02/2020 Clinical Impression Patient presents with functional oropharyngeal swallow ability without aspiration or penetration of any consistency tested *thin, nectar, pudding, cracker bolus.  Her swallow was strong and timely without residuals.  She did appear with potentially prominent cricopharyngeus when swallowing pudding barium but this did not impact barium flow. SLP questions if prominent CP may have contributed to symptom of pill lodging over the weekend.  Pt advises she drinks more liquids or consumes bread to clear pills when she senses they stick in her throat.  Pt did not report any overt symptoms of dysphagia during MBS - ie coughing or complain of sensation of residuals.  SLP did not test barium tablet to allow testing during esophagram as needed.  Recommend regular/thin diet as tolerated.  Will follow up x1 to review testing findings and provide compensations that may migitate dysphagia symptoms.  Thanks for this consult. SLP Visit Diagnosis Dysphagia, unspecified (R13.10) Attention and concentration  deficit following -- Frontal lobe and executive function deficit following -- Impact on safety and function Mild aspiration risk   CHL IP TREATMENT RECOMMENDATION 11/02/2020 Treatment Recommendations Therapy as outlined in treatment plan below   Prognosis 11/02/2020 Prognosis for Safe Diet Advancement Good Barriers to Reach Goals -- Barriers/Prognosis Comment -- CHL IP DIET RECOMMENDATION 11/02/2020 SLP Diet Recommendations Regular solids;Thin liquid Liquid Administration via Straw Medication Administration Other (Comment) Compensations Slow rate;Small sips/bites Postural Changes Remain semi-upright after after feeds/meals (Comment);Seated upright at 90 degrees   CHL IP OTHER RECOMMENDATIONS 11/02/2020 Recommended Consults -- Oral Care Recommendations Oral care BID Other Recommendations --   CHL IP FOLLOW UP RECOMMENDATIONS 04/06/2016 Follow up Recommendations None   CHL IP FREQUENCY AND DURATION 11/02/2020 Speech Therapy Frequency (ACUTE ONLY)  min 1 x/week Treatment Duration 1 week      CHL IP ORAL PHASE 11/02/2020 Oral Phase Impaired Oral - Pudding Teaspoon -- Oral - Pudding Cup -- Oral - Honey Teaspoon -- Oral - Honey Cup -- Oral - Nectar Teaspoon -- Oral - Nectar Cup -- Oral - Nectar Straw WFL Oral - Thin Teaspoon WFL Oral - Thin Cup -- Oral - Thin Straw WFL Oral - Puree WFL Oral - Mech Soft WFL Oral - Regular -- Oral - Multi-Consistency -- Oral - Pill -- Oral Phase - Comment --  CHL IP PHARYNGEAL PHASE 11/02/2020 Pharyngeal Phase WFL Pharyngeal- Pudding Teaspoon -- Pharyngeal -- Pharyngeal- Pudding Cup -- Pharyngeal -- Pharyngeal- Honey Teaspoon -- Pharyngeal -- Pharyngeal- Honey Cup -- Pharyngeal -- Pharyngeal- Nectar Teaspoon -- Pharyngeal -- Pharyngeal- Nectar Cup -- Pharyngeal -- Pharyngeal- Nectar Straw WFL Pharyngeal Material does not enter airway Pharyngeal- Thin Teaspoon WFL Pharyngeal Material does not enter airway Pharyngeal- Thin Cup -- Pharyngeal -- Pharyngeal- Thin Straw WFL Pharyngeal Material does  not enter airway Pharyngeal- Puree WFL Pharyngeal Material does not enter airway Pharyngeal- Mechanical Soft WFL Pharyngeal Material does not enter airway Pharyngeal- Regular -- Pharyngeal -- Pharyngeal- Multi-consistency -- Pharyngeal -- Pharyngeal- Pill -- Pharyngeal -- Pharyngeal Comment --  CHL IP CERVICAL ESOPHAGEAL PHASE 11/02/2020 Cervical Esophageal Phase Impaired Pudding Teaspoon -- Pudding Cup -- Honey Teaspoon -- Honey Cup -- Nectar Teaspoon -- Nectar Cup -- Nectar Straw -- Thin Teaspoon -- Thin Cup -- Thin Straw -- Puree -- Mechanical Soft -- Regular -- Multi-consistency -- Pill -- Cervical Esophageal Comment Appearance of potential prominent cricopharyngeus viewed when swallowing pudding, did not impair barium flow. Rolena Infante, MS [facility] SLP Acute Rehab Services Office [phone] Pager [phone] Chales Abrahams 11/02/2020, 6:18 PM              DG ESOPHAGUS W SINGLE CM (SOL OR THIN BA)  Result Date: 11/02/2020 CLINICAL DATA:  Dysphagia.  Stroke. EXAM: ESOPHOGRAM/BARIUM SWALLOW TECHNIQUE: Single contrast examination was performed using  thin barium. FLUOROSCOPY TIME:  Fluoroscopy Time:  0 minutes 48 seconds Radiation Exposure Index (if provided by the fluoroscopic device): Number of Acquired Spot Images: 3 COMPARISON:  None. FINDINGS: No aspiration identified. There is a tracheostomy. No fistula to the tracheostomy identified. Normal esophageal motility. No stricture or mass. Negative for hiatal hernia. IMPRESSION: Negative esophagram. Electronically Signed   By: Marlan Palau M.D.   On: 11/02/2020 15:41     Subjective: Eager to go home  Discharge Exam: Vitals:   11/03/20 0954 11/03/20 1130  BP: 122/78   Pulse: 93 (!) 101  Resp: 18 16  Temp: 98.6 F (37 C)   SpO2: 93% 96%   Vitals:   11/03/20 0616 11/03/20 0810 11/03/20 0954 11/03/20 1130  BP: 115/70  122/78   Pulse: (!) 106 (!) 117 93 (!) 101  Resp: 18 18 18 16   Temp: 98.4 F (36.9 C)  98.6 F (37 C)   TempSrc: Oral   Oral   SpO2: 98% 95% 93% 96%  Weight:      Height:        General: Pt is alert, awake, not in acute distress Cardiovascular: RRR, S1/S2 +, no rubs, no gallops Respiratory: CTA bilaterally, no wheezing, no rhonchi Abdominal: Soft, NT, ND, bowel sounds + Extremities: no edema, no cyanosis   The results of significant diagnostics from this hospitalization (including imaging, microbiology, ancillary and laboratory) are listed below for reference.     Microbiology: Recent Results (from the  past 240 hour(s))  Resp Panel by RT-PCR (Flu A&B, Covid) Nasopharyngeal Swab     Status: None   Collection Time: 11/01/20  5:04 PM   Specimen: Nasopharyngeal Swab; Nasopharyngeal(NP) swabs in vial transport medium  Result Value Ref Range Status   SARS Coronavirus 2 by RT PCR NEGATIVE NEGATIVE Final    Comment: (NOTE) SARS-CoV-2 target nucleic acids are NOT DETECTED.  The SARS-CoV-2 RNA is generally detectable in upper respiratory specimens during the acute phase of infection. The lowest concentration of SARS-CoV-2 viral copies this assay can detect is 138 copies/mL. A negative result does not preclude SARS-Cov-2 infection and should not be used as the sole basis for treatment or other patient management decisions. A negative result may occur with  improper specimen collection/handling, submission of specimen other than nasopharyngeal swab, presence of viral mutation(s) within the areas targeted by this assay, and inadequate number of viral copies(<138 copies/mL). A negative result must be combined with clinical observations, patient history, and epidemiological information. The expected result is Negative.  Fact Sheet for Patients:  BloggerCourse.com  Fact Sheet for Healthcare Providers:  SeriousBroker.it  This test is no t yet approved or cleared by the [place] FDA and  has been authorized for detection and/or diagnosis of SARS-CoV-2  by FDA under an Emergency Use Authorization (EUA). This EUA will remain  in effect (meaning this test can be used) for the duration of the COVID-19 declaration under Section 564(b)(1) of the Act, 21 U.S.C.section 360bbb-3(b)(1), unless the authorization is terminated  or revoked sooner.       Influenza A by PCR NEGATIVE NEGATIVE Final   Influenza B by PCR NEGATIVE NEGATIVE Final    Comment: (NOTE) The Xpert Xpress SARS-CoV-2/FLU/RSV plus assay is intended as an aid in the diagnosis of influenza from Nasopharyngeal swab specimens and should not be used as a sole basis for treatment. Nasal washings and aspirates are unacceptable for Xpert Xpress SARS-CoV-2/FLU/RSV testing.  Fact Sheet for Patients: BloggerCourse.com  Fact Sheet for Healthcare Providers: SeriousBroker.it  This test is not yet approved or cleared by the [place] FDA and has been authorized for detection and/or diagnosis of SARS-CoV-2 by FDA under an Emergency Use Authorization (EUA). This EUA will remain in effect (meaning this test can be used) for the duration of the COVID-19 declaration under Section 564(b)(1) of the Act, 21 U.S.C. section 360bbb-3(b)(1), unless the authorization is terminated or revoked.  Performed at [facility], [address], [place], [place] [address]      Labs: BNP (last 3 results) No results for input(s): BNP in the last 8760 hours. Basic Metabolic Panel: Recent Labs  Lab 11/01/20 1346 11/02/20 1213  NA 138 137  K 3.6 3.6  CL 102 105  CO2 25 21*  GLUCOSE 89 83  BUN 10 12  CREATININE <0.30* <0.30*  CALCIUM 8.9 8.7*   Liver Function Tests: Recent Labs  Lab 11/01/20 1346  AST 18  ALT 16  ALKPHOS 53  BILITOT 0.6  PROT 7.4  ALBUMIN 4.1   No results for input(s): LIPASE, AMYLASE in the last 168 hours. No results for input(s): AMMONIA in the last 168 hours. CBC: Recent Labs  Lab  11/01/20 1346 11/02/20 0606 11/03/20 0431  WBC 4.7 4.6 6.3  NEUTROABS 2.8  --  4.1  HGB 14.2 13.4 12.2  HCT 43.9 40.8 37.2  MCV 92.0 90.9 91.2  PLT 120* 72* 106*   Cardiac Enzymes: No results for input(s): CKTOTAL, CKMB, CKMBINDEX, TROPONINI in  the last 168 hours. BNP: Invalid input(s): POCBNP CBG: Recent Labs  Lab 11/02/20 2041  GLUCAP 122*   D-Dimer No results for input(s): DDIMER in the last 72 hours. Hgb A1c No results for input(s): HGBA1C in the last 72 hours. Lipid Profile No results for input(s): CHOL, HDL, LDLCALC, TRIG, CHOLHDL, LDLDIRECT in the last 72 hours. Thyroid function studies No results for input(s): TSH, T4TOTAL, T3FREE, THYROIDAB in the last 72 hours.  Invalid input(s): FREET3 Anemia work up No results for input(s): VITAMINB12, FOLATE, FERRITIN, TIBC, IRON, RETICCTPCT in the last 72 hours. Urinalysis    Component Value Date/Time   COLORURINE YELLOW 03/12/2020 2322   APPEARANCEUR CLEAR 03/12/2020 2322   LABSPEC 1.010 03/12/2020 2322   PHURINE 6.0 03/12/2020 2322   GLUCOSEU NEGATIVE 03/12/2020 2322   HGBUR SMALL (A) 03/12/2020 2322   BILIRUBINUR NEGATIVE 03/12/2020 2322   KETONESUR NEGATIVE 03/12/2020 2322   PROTEINUR NEGATIVE 03/12/2020 2322   UROBILINOGEN 0.2 04/27/2014 2314   NITRITE POSITIVE (A) 03/12/2020 2322   LEUKOCYTESUR MODERATE (A) 03/12/2020 2322   Sepsis Labs Invalid input(s): PROCALCITONIN,  WBC,  LACTICIDVEN Microbiology Recent Results (from the past 240 hour(s))  Resp Panel by RT-PCR (Flu A&B, Covid) Nasopharyngeal Swab     Status: None   Collection Time: 11/01/20  5:04 PM   Specimen: Nasopharyngeal Swab; Nasopharyngeal(NP) swabs in vial transport medium  Result Value Ref Range Status   SARS Coronavirus 2 by RT PCR NEGATIVE NEGATIVE Final    Comment: (NOTE) SARS-CoV-2 target nucleic acids are NOT DETECTED.  The SARS-CoV-2 RNA is generally detectable in upper respiratory specimens during the acute phase of infection. The  lowest concentration of SARS-CoV-2 viral copies this assay can detect is 138 copies/mL. A negative result does not preclude SARS-Cov-2 infection and should not be used as the sole basis for treatment or other patient management decisions. A negative result may occur with  improper specimen collection/handling, submission of specimen other than nasopharyngeal swab, presence of viral mutation(s) within the areas targeted by this assay, and inadequate number of viral copies(<138 copies/mL). A negative result must be combined with clinical observations, patient history, and epidemiological information. The expected result is Negative.  Fact Sheet for Patients:  BloggerCourse.com  Fact Sheet for Healthcare Providers:  SeriousBroker.it  This test is no t yet approved or cleared by the [place] FDA and  has been authorized for detection and/or diagnosis of SARS-CoV-2 by FDA under an Emergency Use Authorization (EUA). This EUA will remain  in effect (meaning this test can be used) for the duration of the COVID-19 declaration under Section 564(b)(1) of the Act, 21 U.S.C.section 360bbb-3(b)(1), unless the authorization is terminated  or revoked sooner.       Influenza A by PCR NEGATIVE NEGATIVE Final   Influenza B by PCR NEGATIVE NEGATIVE Final    Comment: (NOTE) The Xpert Xpress SARS-CoV-2/FLU/RSV plus assay is intended as an aid in the diagnosis of influenza from Nasopharyngeal swab specimens and should not be used as a sole basis for treatment. Nasal washings and aspirates are unacceptable for Xpert Xpress SARS-CoV-2/FLU/RSV testing.  Fact Sheet for Patients: BloggerCourse.com  Fact Sheet for Healthcare Providers: SeriousBroker.it  This test is not yet approved or cleared by the [place] FDA and has been authorized for detection and/or diagnosis of SARS-CoV-2 by FDA under  an Emergency Use Authorization (EUA). This EUA will remain in effect (meaning this test can be used) for the duration of the COVID-19 declaration under Section 564(b)(1) of the Act,  21 U.S.C. section 360bbb-3(b)(1), unless the authorization is terminated or revoked.  Performed at [facility], [address] [place], [place] [address]    Time spent: 30 min  SIGNED:   Rickey Barbara, MD  [facility] 11/03/2020, 2:32 PM  If 7PM-7AM, please contact night-coverage

## 2020-11-03 NOTE — Plan of Care (Signed)

## 2020-11-03 NOTE — Progress Notes (Signed)
Speech Language Pathology Treatment: Dysphagia  Patient Details Name: Veronica Huff MRN: [id number] DOB: 14-Jul-1986 Today's Date: 11/03/2020 Time: 1537-9432 SLP Time Calculation (min) (ACUTE ONLY): 12 min  Assessment / Plan / Recommendation Clinical Impression  Pt verbalized she wishes to get a Gtube for medication administration, SLP advised she attempt other ways for medication administration before this extreme measure.  SLP educated her to findings of MBS using video loops and sharing prominent cricopharyngeus on still image.  Suspect this may contribute to occasional pill dysphagia.  Reviewed alternatives for pill administration including consider suspension, crushed, halved, whole with purees, etc.  Recommend she speak to pharmacist to assess if pills can be modified to ease swallowing.  Medication not due until 1300 today per RN, thus SlP obtained a cashew and pudding to emulate pill swallowing.  Starting with water consumption for moisture, SLP than swalllowed cashew with pudding - followed by 2nd pudding bolus and then water.  She did advise she sensed lodging acutely but it cleared with more pudding.  She advised thast she now has an alternative way to take medications and states it eased her swalloiwng.  Reinforced need to start and follow with liquids.  Boyfriend present and advises that he helps pt with assisted cough at home and thus feels comfortable with heimlich maneuver use if needed.  No SlP follow up indicated.  Thanks for allowing me to help care for this pt.       HPI HPI: Pt is a 34 yo female adm to [facility] due to worsening dysphagia since Saturday.  Per notes, pt was scheduled with [place] radiology for barium swallow however they never called to schedule. Saturday, she was attempting to swallow a pill and feels it was sharp and lodged in her throat and since then has had a hoarse voice which is new since Saturday. Pt reported to MD that she drank water 11/01/2020 and it came  directly out of her stoma.  Pt is s/p Fiberoptic exam that per MD notes showed prominent granulation circumferentially at the level of the trach tube.  Md notes indicate pt has not been diagnosed with pneumonia or hospitalized. Pt reported suctioning and changing trach was not helpful.  No other symptoms including no drooling, dyspnea, fever, chest pain, n/v/d.   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   Pt also with h/o depression, neurogenic bowel, recurrent use of ABX, sacral decub, vent use at night. CXR and DG neck negative for acute findings.  Today pt reported to this SLP that she drank water without tracheal loss.  Pt is s/p MBS and esophagram.  Today follow up indicated to assess po tolerance and for education re: compensations for prominent cricopharyngeus.  Pt advises that she eats Len Blalock crackers to help clear pills.      SLP Plan  All goals met       Recommendations  Diet recommendations: Regular;Thin liquid Medication Administration: Other (Comment) (pills with pudding - start and follow with liquids) Supervision: Trained caregiver to feed patient Compensations: Slow rate;Small sips/bites Postural Changes and/or Swallow Maneuvers: Seated upright 90 degrees;Upright 30-60 min after meal                SLP Visit Diagnosis: Dysphagia, unspecified (R13.10) Plan: All goals met       GO  Veronica Huff 11/03/2020, 12:23 PM   Kathleen Lime, MS [facility] SLP Acute Rehab Services Office [phone] Pager [phone]

## 2020-11-10 ENCOUNTER — Encounter: Payer: Self-pay | Admitting: Cardiology

## 2020-11-10 ENCOUNTER — Ambulatory Visit (INDEPENDENT_AMBULATORY_CARE_PROVIDER_SITE_OTHER): Payer: Medicare Other

## 2020-11-10 ENCOUNTER — Other Ambulatory Visit: Payer: Self-pay

## 2020-11-10 ENCOUNTER — Ambulatory Visit (INDEPENDENT_AMBULATORY_CARE_PROVIDER_SITE_OTHER): Payer: Medicare Other | Admitting: Cardiology

## 2020-11-10 VITALS — BP 118/68 | HR 98 | Wt 120.0 lb

## 2020-11-10 DIAGNOSIS — R002 Palpitations: Secondary | ICD-10-CM

## 2020-11-10 DIAGNOSIS — I639 Cerebral infarction, unspecified: Secondary | ICD-10-CM

## 2020-11-10 NOTE — Patient Instructions (Addendum)
Medication Instructions:  Your physician recommends that you continue on your current medications as directed. Please refer to the Current Medication list given to you today.  Labwork: None ordered.  Testing/Procedures: Your physician has recommended that you wear a holter monitor. Holter monitors are medical devices that record the heart's electrical activity. Doctors most often use these monitors to diagnose arrhythmias. Arrhythmias are problems with the speed or rhythm of the heartbeat. The monitor is a small, portable device. You can wear one while you do your normal daily activities. This is usually used to diagnose what is causing palpitations/syncope (passing out).  You will wear a zio monitor for 14 days  Follow-Up: Your physician wants you to follow-up in: 8 weeks with Dr. Lalla Brothers with a virtual visit.  January 04, 2021 at 11:45 am  Any Other Special Instructions Will Be Listed Below (If Applicable).  If you need a refill on your cardiac medications before your next appointment, please call your pharmacy.   ZIO XT- Long Term Monitor Instructions   Your physician has requested you wear your ZIO patch monitor__14_days.   This is a single patch monitor.  [employer] supplies one patch monitor per enrollment.  Additional stickers are not available.   Please do not apply patch if you will be having a Nuclear Stress Test, Echocardiogram, Cardiac CT, MRI, or Chest Xray during the time frame you would be wearing the monitor. The patch cannot be worn during these tests.  You cannot remove and re-apply the ZIO XT patch monitor.   Your ZIO patch monitor will be sent USPS Priority mail from [facility] directly to your home address. The monitor may also be mailed to a PO BOX if home delivery is not available.   It may take 3-5 days to receive your monitor after you have been enrolled.   Once you have received you monitor, please review enclosed instructions.  Your monitor has already  been registered assigning a specific monitor serial # to you.   Applying the monitor   Shave hair from upper left chest.   Hold abrader disc by orange tab.  Rub abrader in 40 strokes over left upper chest as indicated in your monitor instructions.   Clean area with 4 enclosed alcohol pads .  Use all pads to assure are is cleaned thoroughly.  Let dry.   Apply patch as indicated in monitor instructions.  Patch will be place under collarbone on left side of chest with arrow pointing upward.   Rub patch adhesive wings for 2 minutes.Remove white label marked "1".  Remove white label marked "2".  Rub patch adhesive wings for 2 additional minutes.   While looking in a mirror, press and release button in center of patch.  A small green light will flash 3-4 times .  This will be your only indicator the monitor has been turned on.     Do not shower for the first 24 hours.  You may shower after the first 24 hours.   Press button if you feel a symptom. You will hear a small click.  Record Date, Time and Symptom in the Patient Log Book.   When you are ready to remove patch, follow instructions on last 2 pages of Patient Log Book.  Stick patch monitor onto last page of Patient Log Book.   Place Patient Log Book in [place] box.  Use locking tab on box and tape box closed securely.  The [employer] and [employer] has [employer] on it.  Please place in mailbox as soon as possible.  Your physician should have your test results approximately 7 days after the monitor has been mailed back to [facility].   Call [facility] Customer Care at [phone] if you have questions regarding your ZIO XT patch monitor.  Call them immediately if you see an orange light blinking on your monitor.   If your monitor falls off in less than 4 days contact our Monitor department at [phone].  If your monitor becomes loose or falls off after 4 days call [employer] at [phone] for suggestions on securing your monitor.

## 2020-11-10 NOTE — Progress Notes (Signed)
Electrophysiology Office Note:    Date:  11/10/2020   ID:  Veronica Huff, DOB 1986-01-23, MRN [id number]  PCP:  Annita Brod, MD  [facility] Cardiologist:  No primary care provider on file.  [facility] Electrophysiologist:  None   Referring MD: Annita Brod, MD   Chief Complaint: Palpitations  History of Present Illness:    Veronica Huff is a 34 y.o. female who presents for an evaluation of palpitations at the request of Dr. Aretta Nip. Their medical history includes embolic stroke, quadriplegia, muscle spasm, neurogenic bladder now with suprapubic catheter, recent urinary tract infection, previous pressure ulcer on the sacrum.  She was last seen by her primary care physician on October 25, 2020.    Past Medical History:  Diagnosis Date  . Anxiety   . CVA (cerebral infarction)    Ischemic brainstem stroke in 2011 at age 72 due to unknown reasons--quadriplegic after stroke.  . Depression    Zoloft has helped-not having any current depression  . Edema extremities    Feet and legs-mostly in feet  . Headache(784.0)    Prior to imipramine-none currently  . Pneumonia   . Quadriplegia (HCC)    Secondary to stroke at age 5-has trach-lives with boyfriend-motorized w/c-joystick to chin to drive w/c--I & O cath thru suprapubic stoma - has nursing care 16 hrs daily and boyfriend able to help pt the other hours of day.  . Recurrent UTI (urinary tract infection)    Due to suprapubic stoma, repetitive catheterizations  . Sleep apnea    On ventilator at night -has oxygen but rarely needs unless she has a cold and sats drop  . Stroke [facility])     Past Surgical History:  Procedure Laterality Date  . BLADDER SURGERY  2009,2012  . BRONCHOSCOPY  2006  . CHOLECYSTECTOMY  11/18/2012   Procedure: LAPAROSCOPIC CHOLECYSTECTOMY;  Surgeon: Axel Filler, MD;  Location: [facility] ORS;  Service: General;  Laterality: N/A;  . ESOPHAGOGASTRODUODENOSCOPY    . GASTROSTOMY TUBE PLACEMENT  2000   . ILEOSTOMY  2006  . MICROLARYNGOSCOPY WITH LASER AND BALLOON DILATION N/A 09/27/2016   Procedure: MICROLARYNGOSCOPY WITH LASER AND BALLOON DILATION;  Surgeon: Christia Reading, MD;  Location: [facility] OR;  Service: ENT;  Laterality: N/A;  micro direct laryngoscopy with co2 laser balloon dilation of trachea  . REMOVAL OF GASTROSTOMY TUBE    . SPINE SURGERY  2004,2011  . TENDON REPAIR  2012   Left wrist  . THROAT SURGERY  2004  . TRACHEOSTOMY  2000  . wirst surgery Left   . WISDOM TOOTH EXTRACTION  2008    Current Medications: Current Meds  Medication Sig  . baclofen (LIORESAL) 20 MG tablet TAKE TWO TABLETS BY MOUTH FOUR TIMES A DAY  . busPIRone (BUSPAR) 5 MG tablet Take 5 mg by mouth 2 (two) times daily.  . clotrimazole-betamethasone (LOTRISONE) cream Apply 1 application topically daily as needed (for irritation). On labium  . dantrolene (DANTRIUM) 100 MG capsule TAKE 1 CAPSULE BY MOUTH 3 TIMES DAILY. NEED TO CALL OFFICE FOR FURTHER REFILLS [phone].  Marland Kitchen EPIPEN 2-PAK 0.3 MG/0.3ML SOAJ injection Inject 0.3 mg into the muscle daily as needed for anaphylaxis.  . fluticasone (FLONASE) 50 MCG/ACT nasal spray INSERT 2 SPRAYS IN EACH NOSTRIL EVERY DAY FOR STUFFY NOSE OR DRAINAGE  . gabapentin (NEURONTIN) 300 MG capsule Take 300 mg by mouth 3 (three) times daily.  . hydrocortisone cream 0.5 % Apply 1 application topically 2 (two) times daily. To hairline and brows  .  ibuprofen (ADVIL) 200 MG tablet Take 400 mg by mouth every 6 (six) hours as needed for fever, headache, mild pain, moderate pain or cramping.  Marland Kitchen imipramine (TOFRANIL) 50 MG tablet TAKE 2 TABLETS (100 MG TOTAL) BY MOUTH AT BEDTIME.  Marland Kitchen ketorolac (ACULAR) 0.5 % ophthalmic solution Place 1 drop into both eyes daily.   Marland Kitchen lactulose (CHRONULAC) 10 GM/15ML solution Take 10 g by mouth daily as needed for mild constipation.  Marland Kitchen levocetirizine (XYZAL) 5 MG tablet TAKE 1 TABLET BY MOUTH EVERY DAY AS NEEDED  . medroxyPROGESTERone (DEPO-PROVERA) 150  MG/ML injection Inject 150 mg into the muscle every 3 (three) months.   . Multiple Vitamin (MULTIVITAMIN WITH MINERALS) TABS tablet Take 1 tablet by mouth daily.  . mupirocin ointment (BACTROBAN) 2 % 1 application by Tracheal Tube route daily. Applies to trach-stoma site  . nitrofurantoin (MACRODANTIN) 50 MG capsule Take 50 mg by mouth at bedtime.  Marland Kitchen nystatin (MYCOSTATIN) powder Apply 1 application topically 2 (two) times daily as needed (skin irritation). Small amount . Rash  . polyethylene glycol powder (MIRALAX) 17 GM/SCOOP powder Start taking 1 capful 3 times a day. Slowly cut back as needed until you have normal bowel movements.  . sertraline (ZOLOFT) 100 MG tablet Take 100 mg by mouth at bedtime.  . solifenacin (VESICARE) 10 MG tablet Take 10 mg by mouth daily.  . sucralfate (CARAFATE) 1 GM/10ML suspension Take 1 g by mouth 3 (three) times daily.   Marland Kitchen topiramate (TOPAMAX) 50 MG tablet TAKE 1 TABLET BY MOUTH AT BEDTIME.  . [DISCONTINUED] bisacodyl (DULCOLAX) 10 MG suppository Place 10 mg rectally every other day as needed for moderate constipation. constipation     Allergies:   Bee venom, Amoxicillin, Ciprofloxacin, Tobramycin, Erythromycin, Trazodone, Trazodone and nefazodone, Morphine and related, and Vantin [cefpodoxime]   Social History   Socioeconomic History  . Marital status: Single    Spouse name: Not on file  . Number of children: Not on file  . Years of education: college   . Highest education level: Not on file  Occupational History  . Occupation: disabled  Tobacco Use  . Smoking status: Never Smoker  . Smokeless tobacco: Never Used  Vaping Use  . Vaping Use: Never used  Substance and Sexual Activity  . Alcohol use: No    Alcohol/week: 0.0 standard drinks  . Drug use: No    Types: Marijuana  . Sexual activity: Yes    Birth control/protection: None  Other Topics Concern  . Not on file  Social History Narrative   08/29/17 Lives at home with boyfriend, Has a Geologist, engineering that helps to care for her    Drinks no caffeine    Social Determinants of Corporate investment banker Strain: Not on file  Food Insecurity: Not on file  Transportation Needs: Not on file  Physical Activity: Not on file  Stress: Not on file  Social Connections: Not on file     Family History: The patient's family history includes Cancer in her maternal aunt; Diabetes in her maternal grandfather and mother; Heart disease in her maternal grandfather and paternal grandfather; Hypertension in her mother; Other in her sister; Varicose Veins in her mother.  ROS:   Please see the history of present illness.    All other systems reviewed and are negative.  EKGs/Labs/Other Studies Reviewed:    The following studies were reviewed today: Outside records  EKG:  The ekg ordered today demonstrates sinus tachycardia  Recent Labs: 11/01/2020: ALT  16 11/02/2020: BUN 12; Creatinine, Ser <0.30; Potassium 3.6; Sodium 137 11/03/2020: Hemoglobin 12.2; Platelets 106  Recent Lipid Panel No results found for: CHOL, TRIG, HDL, CHOLHDL, VLDL, LDLCALC, LDLDIRECT  Physical Exam:    VS:  BP 118/68   Pulse 98   Wt 120 lb (54.4 kg)   SpO2 100%   BMI 21.95 kg/m     Wt Readings from Last 3 Encounters:  11/10/20 120 lb (54.4 kg)  11/01/20 120 lb (54.4 kg)  04/30/20 123 lb (55.8 kg)     GEN: no acute distress HEENT: Trach NECK: No JVD;  LYMPHATICS: No lymphadenopathy CARDIAC: RRR, no murmurs, rubs, gallops RESPIRATORY:  Clear to auscultation without rales, wheezing or rhonchi  ABDOMEN: Soft, non-tender, non-distended MUSCULOSKELETAL: Trivial pitting lower extremity edema  SKIN: Warm and dry NEUROLOGIC:  Alert and oriented x 3 PSYCHIATRIC:  Normal affect   ASSESSMENT:    1. Palpitations    PLAN:    In order of problems listed above:  1. Recent palpitations that were worse while she was taking the antibiotic for urinary tract infection (Vantin).  The palpitations have improved  but she still has intermittent episodes of feeling like her heart is going quickly or beating more strongly.  They frequently occur at night but she also had an episode about 1 week ago that occurred while she was playing a video game.  We discussed the possibility of these episodes of palpitations are related to autonomic instability given her history of stroke but I would like to use a 2-week ZIO monitor to assess for any sustained SVTs that may be occurring.  This will also help [place] quantify the burden of the arrhythmia.  2.  History of CVA to brainstem Could be contributing to some dysautonomia and palpitations.  Medication Adjustments/Labs and Tests Ordered: Current medicines are reviewed at length with the patient today.  Concerns regarding medicines are outlined above.  Orders Placed This Encounter  Procedures  . LONG TERM MONITOR (3-14 DAYS)  . EKG 12-Lead   No orders of the defined types were placed in this encounter.    Signed, Steffanie Dunn, MD, [facility]  11/10/2020 3:24 PM    Electrophysiology [facility]

## 2020-11-16 DIAGNOSIS — R002 Palpitations: Secondary | ICD-10-CM

## 2020-11-25 ENCOUNTER — Telehealth: Payer: Self-pay | Admitting: Cardiology

## 2020-11-25 NOTE — Telephone Encounter (Signed)
Patient has a zio XT so results will not be available until patient mails monitor back. I spoke with patient and gave her this information.  She is having the same type palpitations she has been having.  Patient reports she has been activating monitor when she feels the palpitations.

## 2020-11-25 NOTE — Telephone Encounter (Signed)
Patient c/o Palpitations:  High priority if patient c/o lightheadedness, shortness of breath, or chest pain  1) How long have you had palpitations/irregular HR/ Afib? Are you having the symptoms now?  A year, yes  2) Are you currently experiencing lightheadedness, SOB or CP? no  3) Do you have a history of afib (atrial fibrillation) or irregular heart rhythm? No, mom and diagnosed with it  4) Have you checked your BP or HR? (document readings if available): HR 115 highest, 90 lowest  5) Are you experiencing any other symptoms? Rapid HR and pounding heart  Patient states she has been wearing a heart monitor and would like to see if it shows she's in afib.

## 2020-12-12 ENCOUNTER — Telehealth: Payer: Self-pay | Admitting: Student

## 2020-12-12 NOTE — Telephone Encounter (Signed)
   Received page from Answering Service about concern for "interesting heart activity." Called and spoke with patient. She states the morning around 9am, she felt about 3 intense beats of her heart. She checked her vitals. BP was low in the 90's/40's and HR was 111 bpm. She drank some water and rechecked her vitals a little later - BP was 118/78 and HR was in the 90's. No prolonged palpitations. Denies any chest pain, new shortness of breath, lightheadedness, dizziness, near syncope/syncope. She recently wore a Zio Monitor that showed rare isolated SVE and some VE couplets. No atrial fibrillation or sustained arrhythmias. Suspect event this morning was SVE/VE given recent monitor results. Encouraged patient to stay well hydrated. No medications changes necessary at this time. Advised patient to let us know if she has any prolonged episodes of palpitations or any new symptoms.  Corrin Parker, PA-C 12/12/2020 1:23 PM

## 2021-01-03 ENCOUNTER — Telehealth (INDEPENDENT_AMBULATORY_CARE_PROVIDER_SITE_OTHER): Payer: Medicare Other | Admitting: Neurology

## 2021-01-03 ENCOUNTER — Encounter: Payer: Self-pay | Admitting: Neurology

## 2021-01-03 DIAGNOSIS — R519 Headache, unspecified: Secondary | ICD-10-CM | POA: Diagnosis not present

## 2021-01-03 DIAGNOSIS — G8250 Quadriplegia, unspecified: Secondary | ICD-10-CM | POA: Diagnosis not present

## 2021-01-03 DIAGNOSIS — I639 Cerebral infarction, unspecified: Secondary | ICD-10-CM

## 2021-01-03 MED ORDER — TOPIRAMATE 50 MG PO TABS: 50.0000 mg | ORAL_TABLET | Freq: Every day | ORAL | 3 refills | Status: DC

## 2021-01-03 MED ORDER — BACLOFEN 20 MG PO TABS: ORAL_TABLET | ORAL | 1 refills | Status: DC

## 2021-01-03 MED ORDER — DANTROLENE SODIUM 100 MG PO CAPS: ORAL_CAPSULE | ORAL | 5 refills | Status: DC

## 2021-01-03 MED ORDER — IMIPRAMINE HCL 50 MG PO TABS: ORAL_TABLET | ORAL | 11 refills | Status: DC

## 2021-01-03 NOTE — Progress Notes (Signed)
Virtual Visit via Video Note  I connected with Veronica Huff on 01/03/21 at  3:45 PM EST by a video enabled telemedicine application and verified that I am speaking with the correct person using two identifiers.  Location: Patient: at her home Provider: in the office    I discussed the limitations of evaluation and management by telemedicine and the availability of in person appointments. The patient expressed understanding and agreed to proceed.  History of Present Illness: Veronica Huff is a 35 year old female, suffered a brainstem stroke at age 8 years old, with residual spastic quadriplegia, neurogenic bladder.  She is accompanied by her boyfriend, referred by my colleague Dr. Marjory Lies for EMG guided botulinum toxin injection for spastic quadriplegia.  She suffered catastrophic brainstem stroke, cryptogenic at age 74, required intubation, ventilation support, ICU stay, extensive rehabilitation, she regained marked improvement, but become wheelchair-bound with significant quadriplegia,  She also has tracheostomy due to weak cough, she will use ventilator at nighttime, require   suctioning through her tracheostomy.  She has bowel suppositories every other day, was regular bowel regimen, she had a suprapubic catheter.  She lives at home, has caregiver 90 hours each week, her boyfriend moved in with her since 2018, she need total assistance, feeding, dressing.  But she can operate her wheelchair using a mouthpiece, enjoying her computer work and reading.   Previously she got Botox injection through rehabilitation Dr. Riley Kill, most recent injection was in April 2018, for spastic bilateral lower extremity, use the Botox a.  She complains of bilateral lower extremity with sudden positional change, jackknife contraction of bilateral thigh muscle, uncontrollable muscle jumpy movement.  She does use her upper extremity to gesture when she talks.   I was able to review urology record in  October 2018 from [facility], neurogenic bladder, history of augmentation septoplasty, and catheterizable stoma,  MRI of the brain in May 2017, abnormal configuration of the medulla that is related to her previous history of stroke, there is prominence of the CSF space adjacent to the frontal lobe likely represent mild atrophy, no acute abnormality.  UPDATE Apr 11 2018: She is accompanied by her boyfriend at today's clinical visit, she did not respond to previous Botox injection on January 11, 2018, when BOTOX was delivered to bilateral adductor and hip flexor.  I reviewed multiple previous injection note by Dr. Riley Kill, injection was placed at bilateral hamstring muscles, patient reported she responded better to that injection pattern,  UPDATE Sept 12 2019: She is accompanied by her home nurse at today's clinical visit, her lower extremity spasticity are mainly forceful bilateral lower extremity extension, intermixed with bilateral hip flexion, lower extremity muscle clonus, bilateral ankle plantarflexion  Virtual Visit via Video on Jan 5th 2021   History of Present Illness: Last injection was in September 2019, she responded well, she does not feel comfortable to continue injection with COVID-19 pandemics,  Update January 03, 2021 SS: Via virtual visit, is homebound, is not going out due to the pandemic.,  Does not feel comfortable continuing Botox injections for spasticity with Dr. Terrace Arabia.  Not even going out for doctor's appointments.  Hospitalized in December 2021 for dysphagia, barium swallow was unremarkable, ENT signed off.   She lives with her boyfriend, he is her caregiver, have 16 hours nursing via [place]. On Topamax, Tofranil for headaches, doing well as long as on medications, usually stress induced.   Medications as follows:  Dantrum 100 mg 3 times daily Tofranil 50 mg, 2 tablets at  bedtime Topamax 50 mg at bedtime Baclofen 20 mg 2 tablets 4 times daily    Observations/Objective: Via virtual visit, is alert and oriented, speech is concise, soft raspy voice, in wheelchair, has lip operated maneuvering wheelchair device  Assessment and Plan: 1.  Spastic quadriplegia, wheelchair dependent since brainstem stroke at age 79 -Continue polypharmacy for spasticity, refills sent in today -Does not feel comfortable coming out due to the pandemic to restart Botox injections at this point  Follow Up Instructions: Follow-up in 9 months or sooner if needed 10/06/2021 3:45 pm   I discussed the assessment and treatment plan with the patient. The patient was provided an opportunity to ask questions and all were answered. The patient agreed with the plan and demonstrated an understanding of the instructions.   The patient was advised to call back or seek an in-person evaluation if the symptoms worsen or if the condition fails to improve as anticipated.  I spent 20 minutes of face-to-face and non-face-to-face time with patient.  This included previsit chart review, lab review, study review, order entry, electronic health record documentation, patient education.  Otila Kluver, DNP  [facility] [address] [place], [place] [address] [phone]

## 2021-01-04 ENCOUNTER — Telehealth (INDEPENDENT_AMBULATORY_CARE_PROVIDER_SITE_OTHER): Payer: Medicare Other | Admitting: Cardiology

## 2021-01-04 ENCOUNTER — Encounter: Payer: Self-pay | Admitting: Cardiology

## 2021-01-04 ENCOUNTER — Other Ambulatory Visit: Payer: Self-pay

## 2021-01-04 VITALS — BP 116/70 | HR 89 | Ht 62.0 in | Wt 120.0 lb

## 2021-01-04 DIAGNOSIS — R002 Palpitations: Secondary | ICD-10-CM

## 2021-01-04 DIAGNOSIS — R000 Tachycardia, unspecified: Secondary | ICD-10-CM | POA: Diagnosis not present

## 2021-01-04 MED ORDER — METOPROLOL SUCCINATE ER 25 MG PO TB24: 12.5000 mg | ORAL_TABLET | Freq: Two times a day (BID) | ORAL | 3 refills | Status: DC

## 2021-01-04 NOTE — Patient Instructions (Signed)
Medication Instructions:  Your physician has recommended you make the following change in your medication:   1.  START taking metoprolol succinate 25 mg-  TAke 1/2 tablet (12.5 mg) by mouth twice a day.  Labwork: None ordered.  Testing/Procedures: None ordered.  Follow-Up: Your physician wants you to follow-up in: 3 months with Dr. Lalla Brothers.     Any Other Special Instructions Will Be Listed Below (If Applicable).  If you need a refill on your cardiac medications before your next appointment, please call your pharmacy.   Metoprolol Extended-Release Tablets What is this medicine? METOPROLOL (me TOE proe lole) is a beta blocker. It decreases the amount of work your heart has to do and helps your heart beat regularly. It treats high blood pressure and/or prevent chest pain (also called angina). It also treats heart failure. This medicine may be used for other purposes; ask your health care provider or pharmacist if you have questions. COMMON BRAND NAME(S): toprol, Toprol XL What should I tell my health care provider before I take this medicine? They need to know if you have any of these conditions:  diabetes  heart or vessel disease like slow heart rate, worsening heart failure, heart block, sick sinus syndrome or Raynaud's disease  kidney disease  liver disease  lung or breathing disease, like asthma or emphysema  pheochromocytoma  thyroid disease  an unusual or allergic reaction to metoprolol, other beta-blockers, medicines, foods, dyes, or preservatives  pregnant or trying to get pregnant  breast-feeding How should I use this medicine? Take this drug by mouth. Take it as directed on the prescription label at the same time every day. Take it with food. You may cut the tablet in half if it is scored (has a line in the middle of it). This may help you swallow the tablet if the whole tablet is too big. Be sure to take both halves. Do not take just one-half of the tablet. Keep  taking it unless your health care provider tells you to stop. Talk to your health care provider about the use of this drug in children. While it may be prescribed for children as young as 6 for selected conditions, precautions do apply. Overdosage: If you think you have taken too much of this medicine contact a poison control center or emergency room at once. NOTE: This medicine is only for you. Do not share this medicine with others. What if I miss a dose? If you miss a dose, take it as soon as you can. If it is almost time for your next dose, take only that dose. Do not take double or extra doses. What may interact with this medicine? This medicine may interact with the following medications:  certain medicines for blood pressure, heart disease, irregular heart beat  certain medicines for depression, like monoamine oxidase (MAO) inhibitors, fluoxetine, or paroxetine  clonidine  dobutamine  epinephrine  isoproterenol  reserpine This list may not describe all possible interactions. Give your health care provider a list of all the medicines, herbs, non-prescription drugs, or dietary supplements you use. Also tell them if you smoke, drink alcohol, or use illegal drugs. Some items may interact with your medicine. What should I watch for while using this medicine? Visit your doctor or health care professional for regular check ups. Contact your doctor right away if your symptoms worsen. Check your blood pressure and pulse rate regularly. Ask your health care professional what your blood pressure and pulse rate should be, and when you should  contact them. You may get drowsy or dizzy. Do not drive, use machinery, or do anything that needs mental alertness until you know how this medicine affects you. Do not sit or stand up quickly, especially if you are an older patient. This reduces the risk of dizzy or fainting spells. Contact your doctor if these symptoms continue. Alcohol may interfere with  the effect of this medicine. Avoid alcoholic drinks. This medicine may increase blood sugar. Ask your healthcare provider if changes in diet or medicines are needed if you have diabetes. What side effects may I notice from receiving this medicine? Side effects that you should report to your doctor or health care professional as soon as possible:  allergic reactions like skin rash, itching or hives  cold or numb hands or feet  depression  difficulty breathing  faint  fever with sore throat  irregular heartbeat, chest pain  rapid weight gain  signs and symptoms of high blood sugar such as being more thirsty or hungry or having to urinate more than normal. You may also feel very tired or have blurry vision.  swollen legs or ankles Side effects that usually do not require medical attention (report to your doctor or health care professional if they continue or are bothersome):  anxiety or nervousness  change in sex drive or performance  dry skin  headache  nightmares or trouble sleeping  short term memory loss  stomach upset or diarrhea This list may not describe all possible side effects. Call your doctor for medical advice about side effects. You may report side effects to FDA at 1-800-FDA-1088. Where should I keep my medicine? Keep out of the reach of children and pets. Store at room temperature between 20 and 25 degrees C (68 and 77 degrees F). Throw away any unused drug after the expiration date. NOTE: This sheet is a summary. It may not cover all possible information. If you have questions about this medicine, talk to your doctor, pharmacist, or health care provider.  2021 Elsevier/Gold Standard (2019-06-19 18:23:00)

## 2021-01-04 NOTE — Progress Notes (Signed)
Virtual Visit via Telephone Note   This visit type was conducted due to national recommendations for restrictions regarding the COVID-19 Pandemic (e.g. social distancing) in an effort to limit this patient's exposure and mitigate transmission in our community.  Due to her co-morbid illnesses, this patient is at least at moderate risk for complications without adequate follow up.  This format is felt to be most appropriate for this patient at this time.  The patient did not have access to video technology/had technical difficulties with video requiring transitioning to audio format only (telephone).  All issues noted in this document were discussed and addressed.  No physical exam could be performed with this format.  Please refer to the patient's chart for her  consent to telehealth for [facility].    Date:  01/04/2021   ID:  Veronica Huff, DOB Apr 09, 1986, MRN [id number] The patient was identified using 2 identifiers.  Patient Location: Home Provider Location: Office/Clinic   PCP:  Annita Brod, MD   [facility]  Cardiologist:  No primary care provider on file.  Advanced Practice Provider:  No care team member to display Electrophysiologist:  None    {  Evaluation Performed:  Follow-Up Visit  Chief Complaint: Palpitations  History of Present Illness:    Veronica Huff is a 35 y.o. female with palpitations presenting for follow-up.  I last saw the patient November 10, 2020.  Given her palpitations, we ordered a 2-week ZIO monitor.  That ZIO monitor returned without evidence for sustained arrhythmias.  Today on the phone call, she tells me that she continues to feel strong heartbeats with rates in the 90s and low 100s even while at rest.  This corresponds to the ZIO monitor showing average heart rate of 100 bpm.  We spent some time today during the phone call discussing autonomic instability in the setting of brainstem/spinal cord injury.    Past  Medical History:  Diagnosis Date  . Anxiety   . CVA (cerebral infarction)    Ischemic brainstem stroke in 2011 at age 20 due to unknown reasons--quadriplegic after stroke.  . Depression    Zoloft has helped-not having any current depression  . Edema extremities    Feet and legs-mostly in feet  . Headache(784.0)    Prior to imipramine-none currently  . Pneumonia   . Quadriplegia (HCC)    Secondary to stroke at age 58-has trach-lives with boyfriend-motorized w/c-joystick to chin to drive w/c--I & O cath thru suprapubic stoma - has nursing care 16 hrs daily and boyfriend able to help pt the other hours of day.  . Recurrent UTI (urinary tract infection)    Due to suprapubic stoma, repetitive catheterizations  . Sleep apnea    On ventilator at night -has oxygen but rarely needs unless she has a cold and sats drop  . Stroke [facility])    Past Surgical History:  Procedure Laterality Date  . BLADDER SURGERY  2009,2012  . BRONCHOSCOPY  2006  . CHOLECYSTECTOMY  11/18/2012   Procedure: LAPAROSCOPIC CHOLECYSTECTOMY;  Surgeon: Axel Filler, MD;  Location: [facility] ORS;  Service: General;  Laterality: N/A;  . ESOPHAGOGASTRODUODENOSCOPY    . GASTROSTOMY TUBE PLACEMENT  2000  . ILEOSTOMY  2006  . MICROLARYNGOSCOPY WITH LASER AND BALLOON DILATION N/A 09/27/2016   Procedure: MICROLARYNGOSCOPY WITH LASER AND BALLOON DILATION;  Surgeon: Christia Reading, MD;  Location: [facility] OR;  Service: ENT;  Laterality: N/A;  micro direct laryngoscopy with co2 laser balloon dilation of trachea  .  REMOVAL OF GASTROSTOMY TUBE    . SPINE SURGERY  2004,2011  . TENDON REPAIR  2012   Left wrist  . THROAT SURGERY  2004  . TRACHEOSTOMY  2000  . wirst surgery Left   . WISDOM TOOTH EXTRACTION  2008     Current Meds  Medication Sig  . baclofen (LIORESAL) 20 MG tablet TAKE TWO TABLETS BY MOUTH FOUR TIMES A DAY  . busPIRone (BUSPAR) 5 MG tablet Take 5 mg by mouth 2 (two) times daily.  . clotrimazole-betamethasone (LOTRISONE) cream  Apply 1 application topically daily as needed (for irritation). On labium  . dantrolene (DANTRIUM) 100 MG capsule TAKE 1 CAPSULE BY MOUTH 3 TIMES DAILY.  Marland Kitchen Docusate Sodium (DSS) 100 MG CAPS Take 100 mg by mouth in the morning, at noon, and at bedtime.  Marland Kitchen EPIPEN 2-PAK 0.3 MG/0.3ML SOAJ injection Inject 0.3 mg into the muscle daily as needed for anaphylaxis.  . fluticasone (FLONASE) 50 MCG/ACT nasal spray INSERT 2 SPRAYS IN EACH NOSTRIL EVERY DAY FOR STUFFY NOSE OR DRAINAGE  . gabapentin (NEURONTIN) 300 MG capsule Take 300 mg by mouth 3 (three) times daily.  . hydrocortisone cream 0.5 % Apply 1 application topically 2 (two) times daily. To hairline and brows  . ibuprofen (ADVIL) 200 MG tablet Take 400 mg by mouth every 6 (six) hours as needed for fever, headache, mild pain, moderate pain or cramping.  Marland Kitchen imipramine (TOFRANIL) 50 MG tablet TAKE 2 TABLETS (100 MG TOTAL) BY MOUTH AT BEDTIME.  Marland Kitchen ketorolac (ACULAR) 0.5 % ophthalmic solution Place 1 drop into both eyes daily.   Marland Kitchen lactulose (CHRONULAC) 10 GM/15ML solution Take 10 g by mouth daily as needed for mild constipation.  Marland Kitchen levocetirizine (XYZAL) 5 MG tablet TAKE 1 TABLET BY MOUTH EVERY DAY AS NEEDED  . medroxyPROGESTERone (DEPO-PROVERA) 150 MG/ML injection Inject 150 mg into the muscle every 3 (three) months.   . Multiple Vitamin (MULTIVITAMIN WITH MINERALS) TABS tablet Take 1 tablet by mouth daily.  . mupirocin ointment (BACTROBAN) 2 % 1 application by Tracheal Tube route daily. Applies to trach-stoma site  . nitrofurantoin (MACRODANTIN) 50 MG capsule Take 50 mg by mouth at bedtime.  Marland Kitchen nystatin (MYCOSTATIN) powder Apply 1 application topically 2 (two) times daily as needed (skin irritation). Small amount . Rash  . polyethylene glycol powder (MIRALAX) 17 GM/SCOOP powder Start taking 1 capful 3 times a day. Slowly cut back as needed until you have normal bowel movements.  . senna (SENOKOT) 8.6 MG tablet Take 1 tablet by mouth every other day.  .  sertraline (ZOLOFT) 100 MG tablet Take 100 mg by mouth at bedtime.  . solifenacin (VESICARE) 10 MG tablet Take 10 mg by mouth daily.  . sucralfate (CARAFATE) 1 GM/10ML suspension Take 1 g by mouth 3 (three) times daily.   Marland Kitchen topiramate (TOPAMAX) 50 MG tablet Take 1 tablet (50 mg total) by mouth at bedtime.     Allergies:   Bee venom, Amoxicillin, Ciprofloxacin, Tobramycin, Erythromycin, Trazodone, Trazodone and nefazodone, Morphine and related, and Vantin [cefpodoxime]   Social History   Tobacco Use  . Smoking status: Never Smoker  . Smokeless tobacco: Never Used  Vaping Use  . Vaping Use: Never used  Substance Use Topics  . Alcohol use: No    Alcohol/week: 0.0 standard drinks  . Drug use: No    Types: Marijuana     Family Hx: The patient's family history includes Cancer in her maternal aunt; Diabetes in her maternal grandfather and mother;  Heart disease in her maternal grandfather and paternal grandfather; Hypertension in her mother; Other in her sister; Varicose Veins in her mother.  ROS:   Please see the history of present illness.     All other systems reviewed and are negative.   Prior CV studies:   The following studies were reviewed today:  December 17, 2020 ZIO personally reviewed HR 75-137, average 100 bpm. Rare supraventricular and ventricular ectopy. No sustained arrhythmias.    Labs/Other Tests and Data Reviewed:    EKG:  No ECG reviewed.  Recent Labs: 11/01/2020: ALT 16 11/02/2020: BUN 12; Creatinine, Ser <0.30; Potassium 3.6; Sodium 137 11/03/2020: Hemoglobin 12.2; Platelets 106   Recent Lipid Panel No results found for: CHOL, TRIG, HDL, CHOLHDL, LDLCALC, LDLDIRECT  Wt Readings from Last 3 Encounters:  01/04/21 120 lb (54.4 kg)  11/10/20 120 lb (54.4 kg)  11/01/20 120 lb (54.4 kg)     Risk Assessment/Calculations:      Objective:    Vital Signs:  BP 116/70   Pulse 89   Ht 5\' 2"  (1.575 m)   Wt 120 lb (54.4 kg)   SpO2 97%   BMI 21.95  kg/m      ASSESSMENT & PLAN:    1. Sinus tachycardia ZIO monitor shows no sustained supraventricular or ventricular arrhythmias.  I suspect her sinus tachycardia is related to autonomic instability related to her history of brainstem infarct.  We discussed using medications to help slow the heart rate including the potential for off target effects.  She would like to try medical therapy given the persistence of the symptoms associated with her elevated heart rate.  Start metoprolol succinate 12.5 mg by mouth twice daily.  We will plan to touch base in about 3 months to assess response to therapy and to see if further dose titration is warranted.       Time:   Today, I have spent 20 minutes with the patient with telehealth technology discussing the above problems.     Medication Adjustments/Labs and Tests Ordered: Current medicines are reviewed at length with the patient today.  Concerns regarding medicines are outlined above.   Tests Ordered: No orders of the defined types were placed in this encounter.   Medication Changes: No orders of the defined types were placed in this encounter.   Follow Up:  Virtual or In-Person in 3 month(s)  Signed, , MD  01/04/2021 12:02 PM    [facility]

## 2021-01-10 MED ORDER — METOPROLOL SUCCINATE ER 25 MG PO TB24: 12.5000 mg | ORAL_TABLET | Freq: Two times a day (BID) | ORAL | 3 refills | Status: DC

## 2021-03-31 ENCOUNTER — Telehealth: Payer: Self-pay | Admitting: Cardiology

## 2021-03-31 NOTE — Telephone Encounter (Signed)
Attempted to call the pot back re: her 5./17/22 appt with Dr. Lalla Brothers but she was very upset and says her grandfather just passed away and she did not feel like taking at this time.Marland KitchenShe would like to push out the appt and asked to have someone call her back at another time... will forward to [facility] for follow up in a few day.. will not cancel her appt until we can call her back to reschedule.

## 2021-03-31 NOTE — Telephone Encounter (Signed)
Patient called and said that she wants to do a virtual because of her having a difficult time with breathing. Patient is a Merchandiser, retail. Please call to talk with patient about appt.

## 2021-04-04 NOTE — Progress Notes (Deleted)
Electrophysiology Office Follow up Visit Note:    Date:  04/04/2021   ID:  Veronica Huff, DOB [birth year], MRN [id number]  PCP:  Annita Brod, MD  [facility] Cardiologist:  None  [facility] Electrophysiologist:  Lanier Prude, MD    Interval History:    Veronica Huff is a 35 y.o. female who presents for a follow up visit.  I last visited with the patient on January 04, 2021 during a telemedicine consult.  At that appointment we discussed her sinus tachycardia that I believe in part is due to autonomic instability.  We started metoprolol succinate 12.5 mg by mouth twice daily and planned to touch base today to see whether or not she was getting any symptomatic benefit.     Past Medical History:  Diagnosis Date  . Anxiety   . 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  . Depression    Zoloft has helped-not having any current depression  . Edema extremities    Feet and legs-mostly in feet  . Headache(784.0)    Prior to imipramine-none currently  . Pneumonia   . Quadriplegia (HCC)    Secondary to stroke at age 89-has trach-lives with boyfriend-motorized w/c-joystick to chin to drive w/c--I & O cath thru suprapubic stoma - has nursing care 16 hrs daily and boyfriend able to help pt the other hours of day.  . Recurrent UTI (urinary tract infection)    Due to suprapubic stoma, repetitive catheterizations  . Sleep apnea    On ventilator at night -has oxygen but rarely needs unless she has a cold and sats drop  . Stroke [facility])     Past Surgical History:  Procedure Laterality Date  . BLADDER SURGERY  2009,2012  . BRONCHOSCOPY  2006  . CHOLECYSTECTOMY  11/18/2012   Procedure: LAPAROSCOPIC CHOLECYSTECTOMY;  Surgeon: Axel Filler, MD;  Location: [facility] ORS;  Service: General;  Laterality: N/A;  . ESOPHAGOGASTRODUODENOSCOPY    . GASTROSTOMY TUBE PLACEMENT  2000  . ILEOSTOMY  2006  .  MICROLARYNGOSCOPY WITH LASER AND BALLOON DILATION N/A 09/27/2016   Procedure: MICROLARYNGOSCOPY WITH LASER AND BALLOON DILATION;  Surgeon: Christia Reading, MD;  Location: [facility] OR;  Service: ENT;  Laterality: N/A;  micro direct laryngoscopy with co2 laser balloon dilation of trachea  . REMOVAL OF GASTROSTOMY TUBE    . SPINE SURGERY  2004,2011  . TENDON REPAIR  2012   Left wrist  . THROAT SURGERY  2004  . TRACHEOSTOMY  2000  . wirst surgery Left   . WISDOM TOOTH EXTRACTION  2008    Current Medications: No outpatient medications have been marked as taking for the 04/05/21 encounter (Appointment) with Lanier Prude, MD.     Allergies:   Bee venom, Amoxicillin, Ciprofloxacin, Tobramycin, Erythromycin, Trazodone, Trazodone and nefazodone, Morphine and related, and Vantin [cefpodoxime]   Social History   Socioeconomic History  . Marital status: Single    Spouse name: Not on file  . Number of children: Not on file  . Years of education: college   . Highest education level: Not on file  Occupational History  . Occupation: disabled  Tobacco Use  . Smoking status: Never Smoker  . Smokeless tobacco: Never Used  Vaping Use  . Vaping Use: Never used  Substance and Sexual Activity  . Alcohol use: No    Alcohol/week: 0.0 standard drinks  . Drug use: No    Types: Marijuana  .  Sexual activity: Yes    Birth control/protection: None  Other Topics Concern  . Not on file  Social History Narrative   08/29/17 Lives at home with boyfriend, Has a Environmental manager that helps to care for her    Drinks no caffeine    Social Determinants of Corporate investment banker Strain: Not on file  Food Insecurity: Not on file  Transportation Needs: Not on file  Physical Activity: Not on file  Stress: Not on file  Social Connections: Not on file     Family History: The patient's family history includes Cancer in her maternal aunt; Diabetes in her maternal grandfather and mother; Heart disease in her  maternal grandfather and paternal grandfather; Hypertension in her mother; Other in her sister; Varicose Veins in her mother.  ROS:   Please see the history of present illness.    All other systems reviewed and are negative.  EKGs/Labs/Other Studies Reviewed:    The following studies were reviewed today: ***  EKG:  The ekg ordered today demonstrates ***  Recent Labs: 11/01/2020: ALT 16 11/02/2020: BUN 12; Creatinine, Ser <0.30; Potassium 3.6; Sodium 137 11/03/2020: Hemoglobin 12.2; Platelets 106  Recent Lipid Panel No results found for: CHOL, TRIG, HDL, CHOLHDL, VLDL, LDLCALC, LDLDIRECT  Physical Exam:    VS:  There were no vitals taken for this visit.    Wt Readings from Last 3 Encounters:  01/04/21 120 lb (54.4 kg)  11/10/20 120 lb (54.4 kg)  11/01/20 120 lb (54.4 kg)     GEN: *** Well nourished, well developed in no acute distress HEENT: Normal NECK: No JVD; No carotid bruits LYMPHATICS: No lymphadenopathy CARDIAC: ***RRR, no murmurs, rubs, gallops RESPIRATORY:  Clear to auscultation without rales, wheezing or rhonchi  ABDOMEN: Soft, non-tender, non-distended MUSCULOSKELETAL:  No edema; No deformity  SKIN: Warm and dry NEUROLOGIC:  Alert and oriented x 3 PSYCHIATRIC:  Normal affect   ASSESSMENT:    1. Sinus tachycardia    PLAN:    In order of problems listed above:  1. ***  Total time spent with patient today *** minutes. This includes reviewing records, evaluating the patient and coordinating care.   Medication Adjustments/Labs and Tests Ordered: Current medicines are reviewed at length with the patient today.  Concerns regarding medicines are outlined above.  No orders of the defined types were placed in this encounter.  No orders of the defined types were placed in this encounter.    Signed, Steffanie Dunn, MD, [facility], [facility] 04/04/2021 6:43 PM    Electrophysiology [facility]

## 2021-04-05 ENCOUNTER — Ambulatory Visit: Payer: Medicare Other | Admitting: Cardiology

## 2021-04-05 DIAGNOSIS — R000 Tachycardia, unspecified: Secondary | ICD-10-CM

## 2021-04-14 ENCOUNTER — Ambulatory Visit: Payer: Medicare Other | Admitting: Gastroenterology

## 2021-05-03 ENCOUNTER — Emergency Department (HOSPITAL_COMMUNITY): Payer: Medicare Other

## 2021-05-03 ENCOUNTER — Emergency Department (HOSPITAL_COMMUNITY)
Admission: EM | Admit: 2021-05-03 | Discharge: 2021-05-04 | Disposition: A | Discharge status: Home / Self Care | Payer: Medicare Other | Attending: Emergency Medicine | Admitting: Emergency Medicine

## 2021-05-03 ENCOUNTER — Other Ambulatory Visit: Payer: Self-pay

## 2021-05-03 ENCOUNTER — Encounter (HOSPITAL_COMMUNITY): Payer: Self-pay

## 2021-05-03 DIAGNOSIS — J0410 Acute tracheitis without obstruction: Secondary | ICD-10-CM

## 2021-05-03 MED ORDER — HYDROMORPHONE HCL 1 MG/ML IJ SOLN
0.5000 mg | Freq: Once | INTRAMUSCULAR | Status: AC
  Administered 2021-05-04: 0.5 mg via INTRAVENOUS
  Filled 2021-05-03: qty 1

## 2021-05-03 MED ORDER — PIPERACILLIN-TAZOBACTAM 3.375 G IVPB 30 MIN
3.3750 g | Freq: Once | INTRAVENOUS | Status: DC
  Filled 2021-05-03: qty 50

## 2021-05-03 MED ORDER — DIPHENHYDRAMINE HCL 25 MG PO CAPS
25.0000 mg | ORAL_CAPSULE | Freq: Once | ORAL | Status: AC
  Administered 2021-05-04: 25 mg via ORAL
  Filled 2021-05-03: qty 1

## 2021-05-03 MED ORDER — LEVOFLOXACIN 750 MG PO TABS
750.0000 mg | ORAL_TABLET | Freq: Once | ORAL | Status: AC
  Administered 2021-05-04: 750 mg via ORAL
  Filled 2021-05-03: qty 1

## 2021-05-03 NOTE — Discharge Instructions (Addendum)
Please call to follow up with Dr Jenne Pane in the office for your tracheostomy issues.  I started you on 2 antibiotics to treat for the bacteria growing in sputum culture.  You will take doxycycline twice a day for your staff aureus.  You will then take Levafloxacin (Levaquin) once every night for your Pseudomonas.  Because of your history of allergic reactions, you should take the Levafloxacin with 25 mg of benadryl every night.  Make sure you do this at least 2 hours before bedtime, to observe for any allergic reaction.  If you have hives, lightheadedness, throat swelling, or difficulty breathing, have your family call 911 and return to the ER immediately.  If you have any of these reactions, STOP taking the Levaquin at home.

## 2021-05-03 NOTE — ED Provider Notes (Signed)
[facility]-EMERGENCY DEPT Provider Note   CSN: [id number] Arrival date & time: 05/03/21  2056     History CC:  Trach site pain and swelling  Veronica Huff is a 35 y.o. female with a history of quadriplegia, chronic trach, presenting the emergency department with concern for pain around her trach site.  She reports this began about 2-3 weeks ago.  She describes some irritation around her trach insertion site.  She follows with Dr. Jenne Pane at ENT.  She has an appointment with them coming up this week.  She denies fevers or chills, but feels like she has had more mucus and coughing than normal.  Her PCP did a tracheal culture (photos uploaded in media tab in her chart by myself), showing Pseudomonas heavy growth and Staph Aureus.  She has multiple antibiotic and drug allergies as noted.  She has never had anaphylaxis or throat swelling to these medications, but can have hives and itching.  HPI     Past Medical History:  Diagnosis Date   Anxiety    CVA (cerebral infarction)    Ischemic brainstem stroke in 2011 at age 89 due to unknown reasons--quadriplegic after stroke.   Depression    Zoloft has helped-not having any current depression   Edema extremities    Feet and legs-mostly in feet   Headache(784.0)    Prior to imipramine-none currently   Pneumonia    Quadriplegia (HCC)    Secondary to stroke at age 55-has trach-lives with boyfriend-motorized w/c-joystick to chin to drive w/c--I & O cath thru suprapubic stoma - has nursing care 16 hrs daily and boyfriend able to help pt the other hours of day.   Recurrent UTI (urinary tract infection)    Due to suprapubic stoma, repetitive catheterizations   Sleep apnea    On ventilator at night -has oxygen but rarely needs unless she has a cold and sats drop   Stroke [facility])     Patient Active Problem List   Diagnosis Date Noted   Palpitations 11/10/2020   Dysphagia 11/01/2020   Dysphagia following other  cerebrovascular disease 11/01/2020   Spastic tetraplegia with rigidity syndrome (HCC) 12/11/2017   Allergic rhinitis 09/12/2016   Allergic conjunctivitis 09/12/2016   Chronic respiratory failure (HCC) 01/17/2016   Spastic tetraplegia (HCC) 09/29/2015   Chronic daily headache 09/29/2015   Brain stem infarction (HCC) 07/30/2015   Anger reaction 07/30/2015   Generalized anxiety disorder 07/30/2015   Venous (peripheral) insufficiency 12/24/2014   Quadriplegia (HCC)    CVA (cerebral vascular accident) [facility])     Past Surgical History:  Procedure Laterality Date   BLADDER SURGERY  2009,2012   BRONCHOSCOPY  2006   CHOLECYSTECTOMY  11/18/2012   Procedure: LAPAROSCOPIC CHOLECYSTECTOMY;  Surgeon: Axel Filler, MD;  Location: [facility] ORS;  Service: General;  Laterality: N/A;   ESOPHAGOGASTRODUODENOSCOPY     GASTROSTOMY TUBE PLACEMENT  2000   ILEOSTOMY  2006   MICROLARYNGOSCOPY WITH LASER AND BALLOON DILATION N/A 09/27/2016   Procedure: MICROLARYNGOSCOPY WITH LASER AND BALLOON DILATION;  Surgeon: Christia Reading, MD;  Location: [facility] OR;  Service: ENT;  Laterality: N/A;  micro direct laryngoscopy with co2 laser balloon dilation of trachea   REMOVAL OF GASTROSTOMY TUBE     SPINE SURGERY  2004,2011   TENDON REPAIR  2012   Left wrist   THROAT SURGERY  2004   TRACHEOSTOMY  2000   wirst surgery Left    WISDOM TOOTH EXTRACTION  2008     OB History  Gravida  0   Para  0   Term  0   Preterm  0   AB  0   Living  0      SAB  0   IAB  0   Ectopic  0   Multiple  0   Live Births              Family History  Problem Relation Age of Onset   Diabetes Mother    Hypertension Mother    Varicose Veins Mother    Cancer Maternal Aunt        lung   Diabetes Maternal Grandfather    Heart disease Maternal Grandfather    Heart disease Paternal Grandfather    Other Sister        overdose    Social History   Tobacco Use   Smoking status: Never   Smokeless tobacco: Never  Vaping  Use   Vaping Use: Never used  Substance Use Topics   Alcohol use: No    Alcohol/week: 0.0 standard drinks   Drug use: No    Types: Marijuana    Home Medications Prior to Admission medications   Medication Sig Start Date End Date Taking? Authorizing Provider  baclofen (LIORESAL) 20 MG tablet TAKE TWO TABLETS BY MOUTH FOUR TIMES A DAY 01/03/21   Glean Salvo, NP  busPIRone (BUSPAR) 5 MG tablet Take 5 mg by mouth 2 (two) times daily. 09/30/20   [provider]  clotrimazole-betamethasone (LOTRISONE) cream Apply 1 application topically daily as needed (for irritation). On labium 10/28/20   [provider]  dantrolene (DANTRIUM) 100 MG capsule TAKE 1 CAPSULE BY MOUTH 3 TIMES DAILY. 01/03/21   Glean Salvo, NP  Docusate Sodium (DSS) 100 MG CAPS Take 100 mg by mouth in the morning, at noon, and at bedtime. 12/23/20   [provider]  EPIPEN 2-PAK 0.3 MG/0.3ML SOAJ injection Inject 0.3 mg into the muscle daily as needed for anaphylaxis. 05/10/15   [provider]  fluticasone (FLONASE) 50 MCG/ACT nasal spray INSERT 2 SPRAYS IN EACH NOSTRIL EVERY DAY FOR STUFFY NOSE OR DRAINAGE 08/12/18   Bobbitt, Heywood Iles, MD  gabapentin (NEURONTIN) 300 MG capsule Take 300 mg by mouth 3 (three) times daily.    [provider]  hydrocortisone cream 0.5 % Apply 1 application topically 2 (two) times daily. To hairline and brows    [provider]  ibuprofen (ADVIL) 200 MG tablet Take 400 mg by mouth every 6 (six) hours as needed for fever, headache, mild pain, moderate pain or cramping.    [provider]  imipramine (TOFRANIL) 50 MG tablet TAKE 2 TABLETS (100 MG TOTAL) BY MOUTH AT BEDTIME. 01/03/21   Glean Salvo, NP  ketorolac (ACULAR) 0.5 % ophthalmic solution Place 1 drop into both eyes daily.  04/01/19   [provider]  lactulose (CHRONULAC) 10 GM/15ML solution Take 10 g by mouth daily as needed for mild constipation. 08/06/20   [provider]  levocetirizine (XYZAL) 5 MG tablet TAKE 1 TABLET BY MOUTH EVERY DAY AS NEEDED 03/24/19   Bobbitt, Heywood Iles, MD  medroxyPROGESTERone (DEPO-PROVERA) 150 MG/ML injection Inject 150 mg into the muscle every 3 (three) months.  07/09/15   [provider]  metoprolol succinate (TOPROL XL) 25 MG 24 hr tablet Take 0.5 tablets (12.5 mg total) by mouth every 12 (twelve) hours. Take at 12:00 am and 12:00 pm. 01/10/21   Lanier Prude, MD  Multiple Vitamin (MULTIVITAMIN WITH MINERALS) TABS tablet Take 1 tablet by mouth daily.    [provider]  mupirocin ointment (BACTROBAN) 2 % 1 application by Tracheal Tube route daily. Applies to trach-stoma site    [provider]  nitrofurantoin (MACRODANTIN) 50 MG capsule Take 50 mg by mouth at bedtime.    [provider]  nystatin (MYCOSTATIN) powder Apply 1 application topically 2 (two) times daily as needed (skin irritation). Small amount . Rash    [provider]  polyethylene glycol powder (MIRALAX) 17 GM/SCOOP powder Start taking 1 capful 3 times a day. Slowly cut back as needed until you have normal bowel movements. 03/13/20   Nira Connardama, Pedro Eduardo, MD  senna (SENOKOT) 8.6 MG tablet Take 1 tablet by mouth every other day.    [provider]  sertraline (ZOLOFT) 100 MG tablet Take 100 mg by mouth at bedtime.    [provider]  solifenacin (VESICARE) 10 MG tablet Take 10 mg by mouth daily.    [provider]  sucralfate (CARAFATE) 1 GM/10ML suspension Take 1 g by mouth 3 (three) times daily.     [provider]  topiramate (TOPAMAX) 50 MG tablet Take 1 tablet (50 mg total) by mouth at bedtime. 01/03/21   Glean SalvoSlack, Sarah J, NP    Allergies    Bee venom, Amoxicillin, Ciprofloxacin, Tobramycin, Erythromycin, Trazodone, Trazodone and nefazodone, Morphine and related, and Vantin [cefpodoxime]  Review of Systems   Review of Systems  Constitutional:  Negative for chills and  fever.  HENT:  Positive for sore throat and trouble swallowing.   Respiratory:  Positive for cough and choking.   Gastrointestinal:  Negative for abdominal pain and vomiting.  Musculoskeletal:  Negative for arthralgias and myalgias.   Physical Exam Updated Vital Signs BP 119/68   Pulse 92   Temp 98.5 F (36.9 C) (Oral)   Resp 14   SpO2 100%   Physical Exam Constitutional:      General: She is not in acute distress. HENT:     Head: Normocephalic and atraumatic.  Eyes:     Conjunctiva/sclera: Conjunctivae normal.     Pupils: Pupils are equal, round, and reactive to light.  Neck:     Comments: Trach site in place, minimal surrounding erythema No drainage from trach site Cardiovascular:     Rate and Rhythm: Normal rate and regular rhythm.  Pulmonary:     Effort: Pulmonary effort is normal. No respiratory distress.     Comments: 97% on room air Abdominal:     General: There is no distension.     Tenderness: There is no abdominal tenderness.  Skin:    General: Skin is warm and dry.  Neurological:     Mental Status: She is alert. Mental status is at baseline.     Comments: Quadriplegic     ED Results / Procedures / Treatments   Labs (all labs ordered are listed, but only abnormal results are displayed) Labs Reviewed  BASIC METABOLIC PANEL  CBC WITH DIFFERENTIAL/PLATELET    EKG None  Radiology DG Chest Portable 1 View  Result Date: 05/03/2021 CLINICAL DATA:  Neck irritation EXAM: PORTABLE CHEST 1 VIEW COMPARISON:  11/01/2020 FINDINGS: Tracheostomy tube with tip about 4.2 cm superior to carina. No focal opacity or pleural effusion. Stable cardiomediastinal silhouette. No pneumothorax. Posterior spinal surgical wires. IMPRESSION: No active disease. Electronically Signed   By: Jasmine PangKim  Fujinaga M.D.   On: 05/03/2021 22:49    Procedures Procedures  Medications Ordered in ED Medications  HYDROmorphone (DILAUDID) injection 0.5 mg (has no administration in time range)   levofloxacin (LEVAQUIN) tablet 750 mg (has no administration in time range)  diphenhydrAMINE (BENADRYL) capsule 25 mg (has no administration in time range)  doxycycline (VIBRA-TABS) tablet 100 mg (has no administration in time range)    ED Course  I have reviewed the triage vital signs and the nursing notes.  Pertinent labs & imaging results that were available during my care of the patient were reviewed by me and considered in my medical decision making (see chart for details).  The patient is here with tracheal stoma irritation for 3 weeks.  She is clinically well appearing without signs or symptoms of sepsis at this time.  Her chest xray is unremarkable -no evidence of bacterial PNA on this film.  She has no hypoxia.  She appears comfortable on exam.  I reviewed her outpatient sputum cultures (see media tab) significant for Staph Aureus and Pseudomonas.   I do believe that she would benefit from treatment for tracheitis, but will be reasonable to manage her as an outpatient given how clinically well-appearing she has.  She has a follow-up with ENT this week.  However, there are some limitations to her antibiotic options given her extensive allergies.  Doxycycline would be a good option for the staff aureus.  For the Pseudomonas, the only viable oral antibiotic appears to be levofloxacin.  She has had an adverse reaction to ciprofloxacin in the past with hives, no anaphylaxis.  However she does feel that she has had Levaquin specifically without adverse reaction.  I think would be reasonable to treat her here with p.o. Levaquin as well as Benadryl.  We will need to monitor her for period of time after this.  If she has no significant reaction, she can be discharged of his medications at home and follow-up with ENT.  I discussed with the patient and her partner at the bedside the risks and benefits of this plan.  The alternative would be hospital admission for IV antibiotics and PICC line placement.   They would like to attempt the oral antibiotic option.  We discussed the risks of anaphylaxis, and what treatment would entail.  They verbalized understanding of this.  The patient is pending IV team consult for peripheral access prior to giving Levaquin and benadryl.  She was signed out to Dr Blinda Leatherwood EDP at 0100 pending peripheral IV and monitoring following PO antibiotics.  She will need [facility] transport home if medically stable afterwards.    Clinical Course as of 05/04/21 0103  Tue May 03, 2021  2252 IMPRESSION: No active disease. [MT]  Wed May 04, 2021  0045 Pt is awaiting IV team for IV placement. [MT]    Clinical Course User Index [MT] Kaytie Ratcliffe, Kermit Balo, MD    Final Clinical Impression(s) / ED Diagnoses Final diagnoses:  Tracheitis    Rx / DC Orders ED Discharge Orders     None        Timm Bonenberger, Kermit Balo, MD 05/04/21 [phone]

## 2021-05-03 NOTE — ED Triage Notes (Signed)
Patient arrives via EMS, paralyzed from neck down since 35 years old, trach in place. Pt complains of  pain and  irritation around neck x 3 weeks. She reports a positive result for staph infection, and slightly decreased air movement. A&O x4.   EMS vitals: BP 98 palpated HR 80  T 97.8  RR 20

## 2021-05-03 NOTE — ED Notes (Addendum)
Patient reports she is a hard stick and IV team is often consulted for ultrasound IV to be placed, pt reports having difficulty with IV placement even when IV team is consulted. This RN attempted IV but was unsuccessful. Order placed for IV team to attempt ultrasound IV.

## 2021-05-04 DIAGNOSIS — J0410 Acute tracheitis without obstruction: Secondary | ICD-10-CM | POA: Diagnosis not present

## 2021-05-04 LAB — BASIC METABOLIC PANEL
Anion gap: 6 (ref 5–15)
BUN: 11 mg/dL (ref 6–20)
CO2: 28 mmol/L (ref 22–32)
Calcium: 8.8 mg/dL — ABNORMAL LOW (ref 8.9–10.3)
Chloride: 103 mmol/L (ref 98–111)
Creatinine, Ser: 0.3 mg/dL — ABNORMAL LOW (ref 0.44–1.00)
GFR, Estimated: >60 mL/min (ref >60)
Glucose, Bld: 92 mg/dL (ref 70–99)
Potassium: 3.5 mmol/L (ref 3.5–5.1)
Sodium: 137 mmol/L (ref 135–145)

## 2021-05-04 LAB — CBC WITH DIFFERENTIAL/PLATELET
Abs Immature Granulocytes: 0.01 10*3/uL (ref 0.00–0.07)
Basophils Absolute: 0 10*3/uL (ref 0.0–0.1)
Basophils Relative: 0 %
Eosinophils Absolute: 0 10*3/uL (ref 0.0–0.5)
Eosinophils Relative: 0 %
HCT: 44.2 % (ref 36.0–46.0)
Hemoglobin: 14.5 g/dL (ref 12.0–15.0)
Immature Granulocytes: 0 %
Lymphocytes Relative: 39 %
Lymphs Abs: 2.4 10*3/uL (ref 0.7–4.0)
MCH: 29.6 pg (ref 26.0–34.0)
MCHC: 32.8 g/dL (ref 30.0–36.0)
MCV: 90.2 fL (ref 80.0–100.0)
Monocytes Absolute: 0.4 10*3/uL (ref 0.1–1.0)
Monocytes Relative: 7 %
Neutro Abs: 3.3 10*3/uL (ref 1.7–7.7)
Neutrophils Relative %: 54 %
Platelets: 162 10*3/uL (ref 150–400)
RBC: 4.9 MIL/uL (ref 3.87–5.11)
RDW: 13.8 % (ref 11.5–15.5)
WBC: 6 10*3/uL (ref 4.0–10.5)
nRBC: 0 % (ref 0.0–0.2)

## 2021-05-04 MED ORDER — DOXYCYCLINE HYCLATE 100 MG PO CAPS: 100.0000 mg | ORAL_CAPSULE | Freq: Two times a day (BID) | ORAL | 0 refills | Status: AC

## 2021-05-04 MED ORDER — DOXYCYCLINE HYCLATE 100 MG PO TABS
100.0000 mg | ORAL_TABLET | Freq: Once | ORAL | Status: AC
  Administered 2021-05-04: 100 mg via ORAL
  Filled 2021-05-04: qty 1

## 2021-05-04 MED ORDER — DIPHENHYDRAMINE HCL 25 MG PO TABS: 25.0000 mg | ORAL_TABLET | Freq: Four times a day (QID) | ORAL | 0 refills | Status: AC | PRN

## 2021-05-04 MED ORDER — ONDANSETRON HCL 4 MG/2ML IJ SOLN
4.0000 mg | Freq: Once | INTRAMUSCULAR | Status: AC
  Administered 2021-05-04: 4 mg via INTRAVENOUS
  Filled 2021-05-04: qty 2

## 2021-05-04 MED ORDER — LEVOFLOXACIN 750 MG PO TABS: 750.0000 mg | ORAL_TABLET | Freq: Every day | ORAL | 0 refills | Status: AC

## 2021-05-04 MED ORDER — OXYCODONE-ACETAMINOPHEN 5-325 MG PO TABS
1.0000 | ORAL_TABLET | Freq: Once | ORAL | Status: AC
  Administered 2021-05-04: 1 via ORAL
  Filled 2021-05-04: qty 1

## 2021-05-04 NOTE — ED Notes (Signed)
Pt requesting pain meds, provider made aware.  

## 2021-05-04 NOTE — ED Notes (Signed)
[facility] contacted, transport arranged for patient.  

## 2021-05-04 NOTE — ED Notes (Signed)
[facility] here to take pt home. 

## 2021-06-03 ENCOUNTER — Ambulatory Visit: Payer: Medicare Other | Admitting: Gastroenterology

## 2021-06-09 IMAGING — MR MR LUMBAR SPINE W/O CM
4 of 5 series · 28 of 48 positions shown · non-contrast
Comparison: CT abdomen pelvis dated March 13, 2020.

CLINICAL DATA: Low back pain. History of prior lumbar fusion status
post hardware removal for infection. Quadriplegic.

EXAM:
MRI LUMBAR SPINE WITHOUT CONTRAST
TECHNIQUE: Multiplanar, multisequence MR imaging of the lumbar spine was
performed. No intravenous contrast was administered.

[Series 3: T2 · sagittal · 4.0mm · 1.09mm/px · 7 of 17 slices shown (1 of 2)]
[im 1/17]
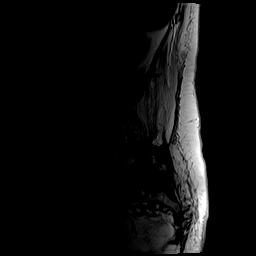
[im 3/17]
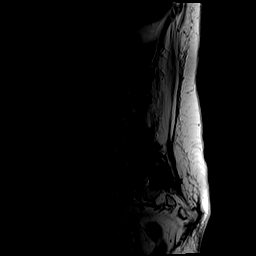
[im 6/17]
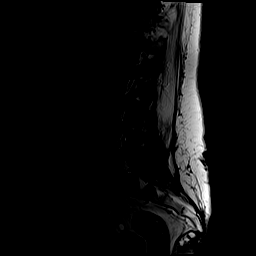
[im 9/17]
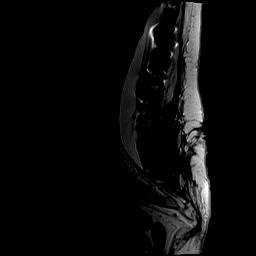
[im 11/17]
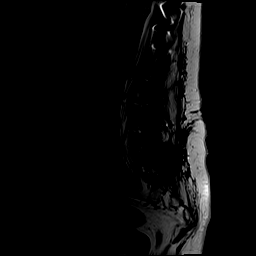
[im 14/17]
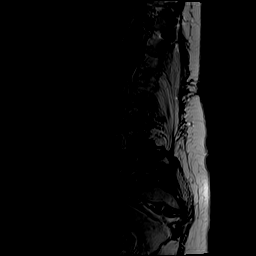
[im 17/17]
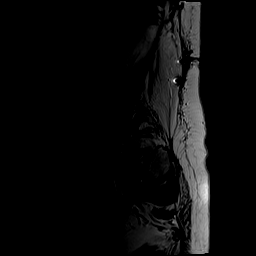

[Series 5: T1 · sagittal · 4.0mm · 1.09mm/px · 6 of 17 slices shown (1 of 2)]
[im 1/17]
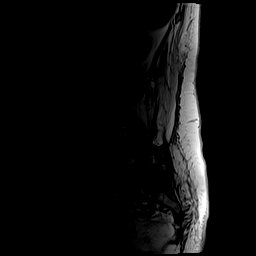
[im 4/17]
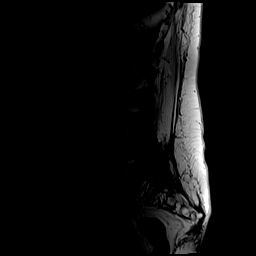
[im 7/17]
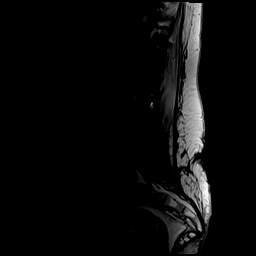
[im 10/17]
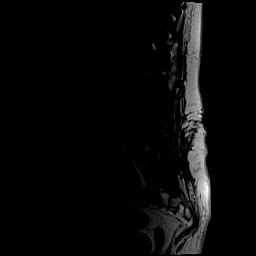
[im 13/17]
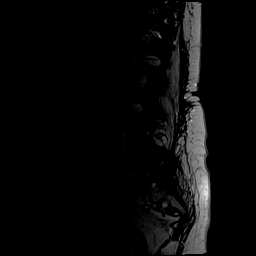
[im 17/17]
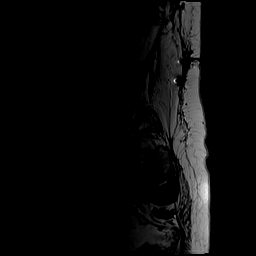

[Series 6: T2 · axial · 4.0mm · 0.39mm/px · z∈[-6,+198]mm · 8 of 38 slices shown (2 of 2)]
[im 1/38]
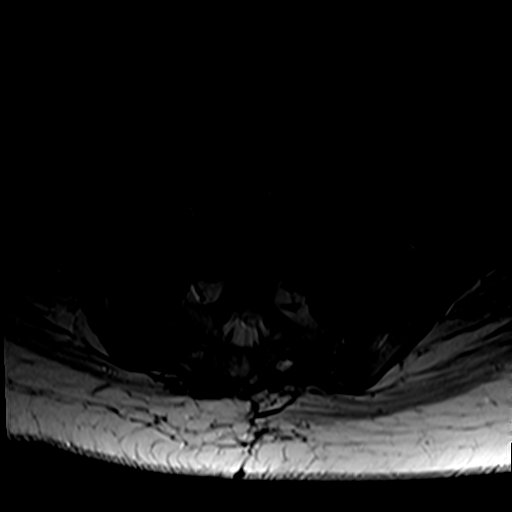
[im 6/38]
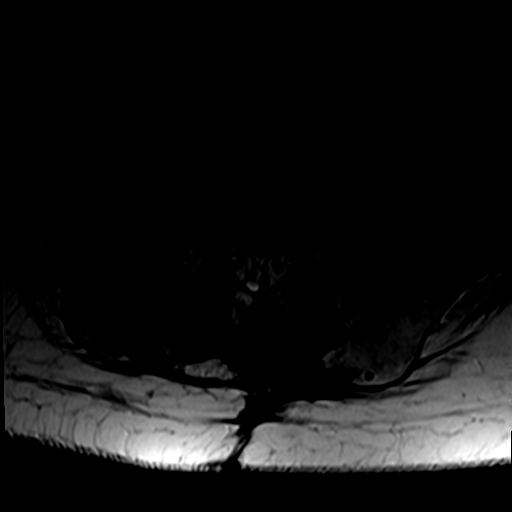
[im 12/38]
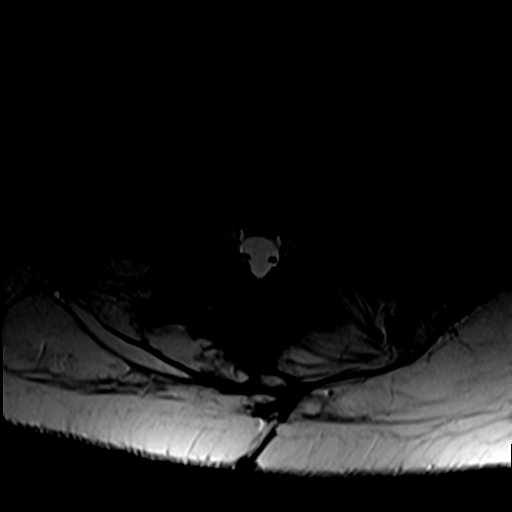
[im 18/38]
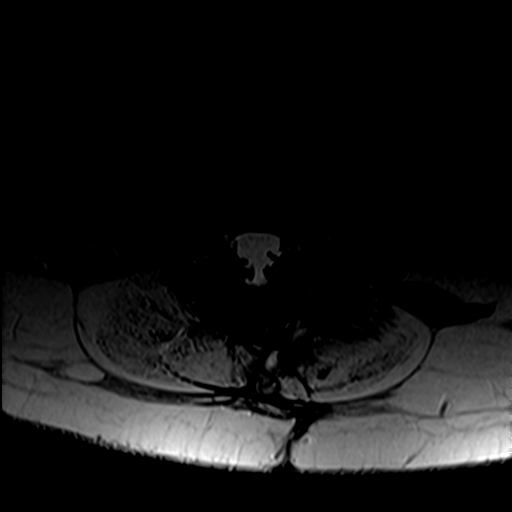
[im 20/38]
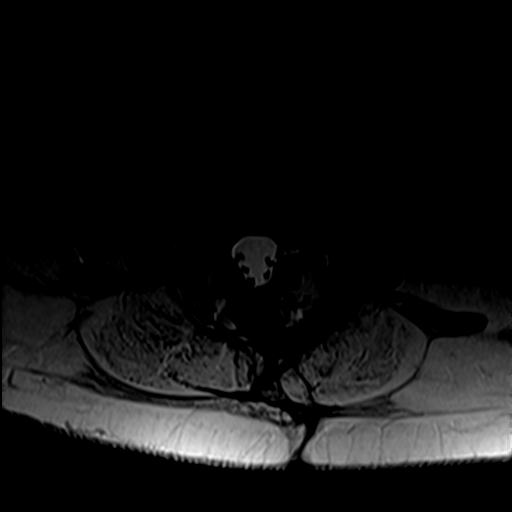
[im 26/38]
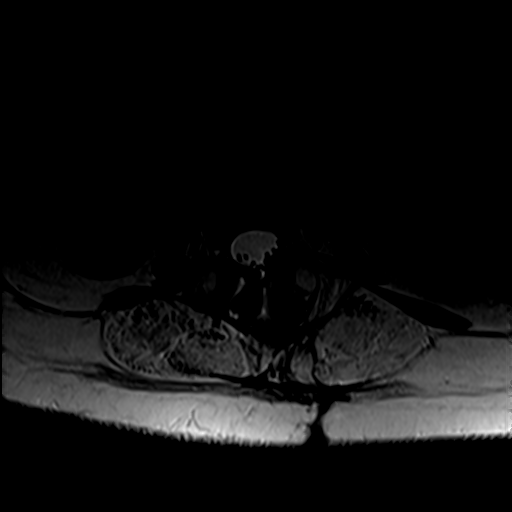
[im 32/38]
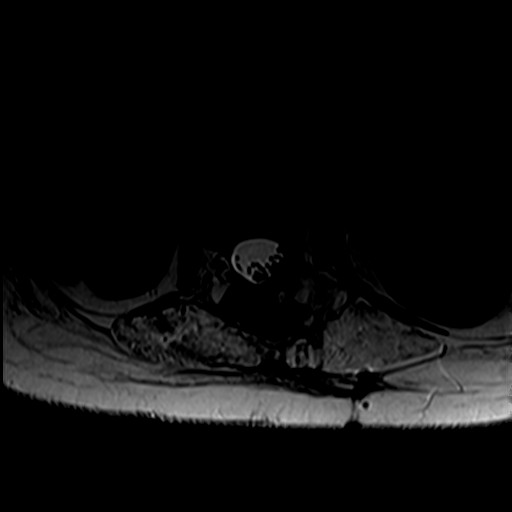
[im 38/38]
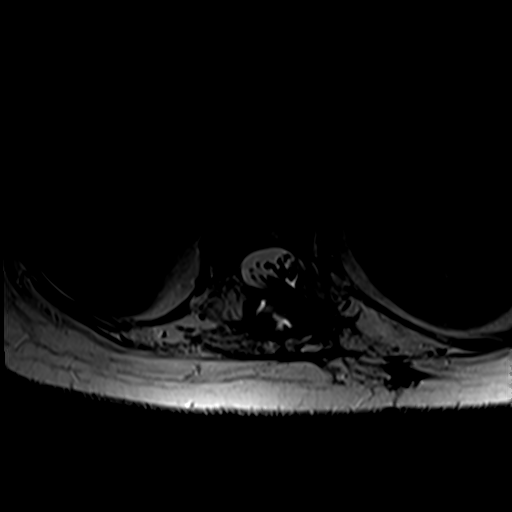

[Series 7: T1 · axial · 4.0mm · 0.39mm/px · z∈[-6,+170]mm · 7 of 38 slices shown (2 of 2)]
[im 1/38]
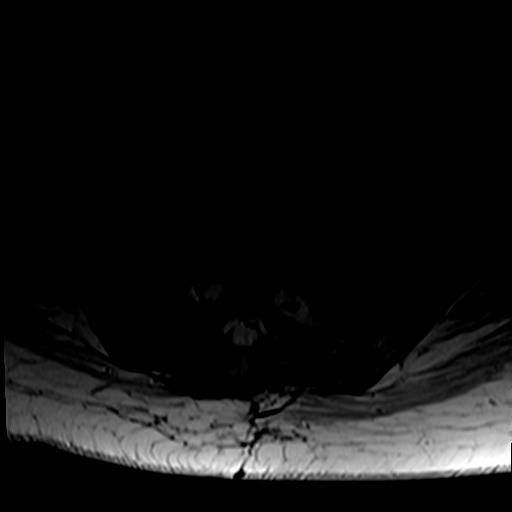
[im 6/38]
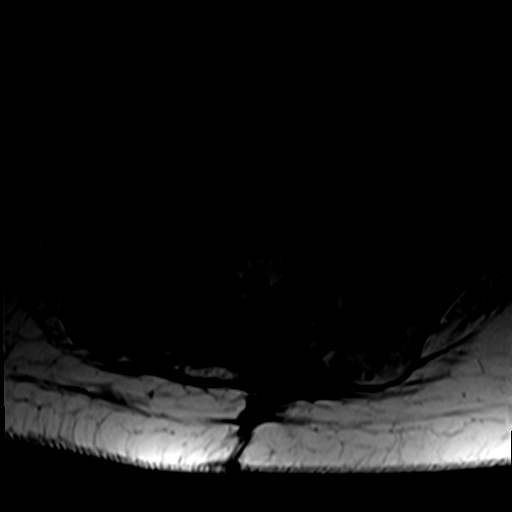
[im 12/38]
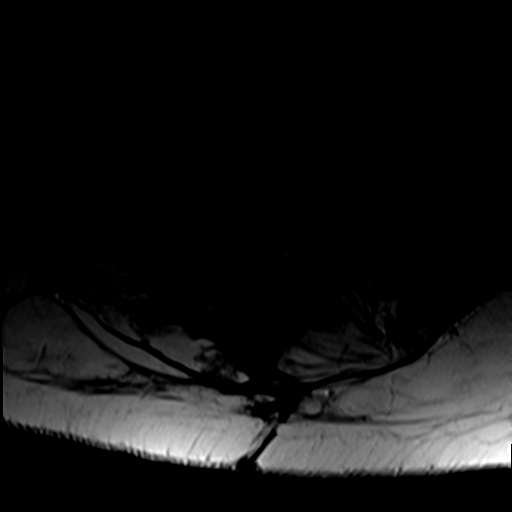
[im 18/38]
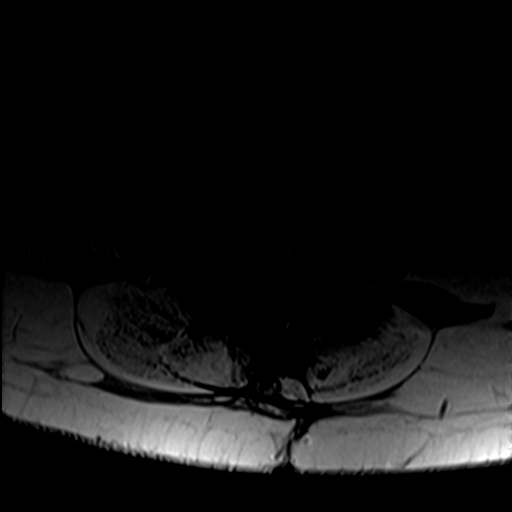
[im 20/38]
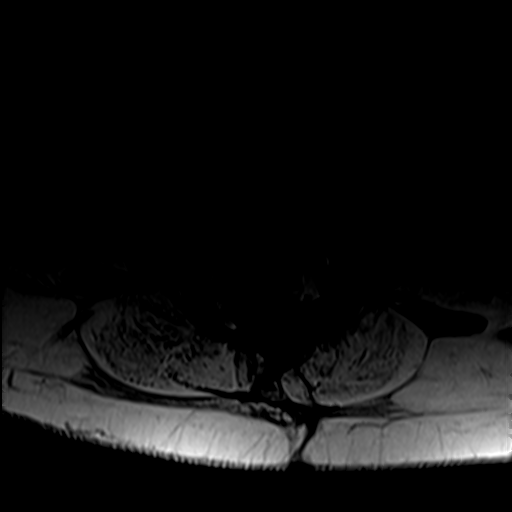
[im 26/38]
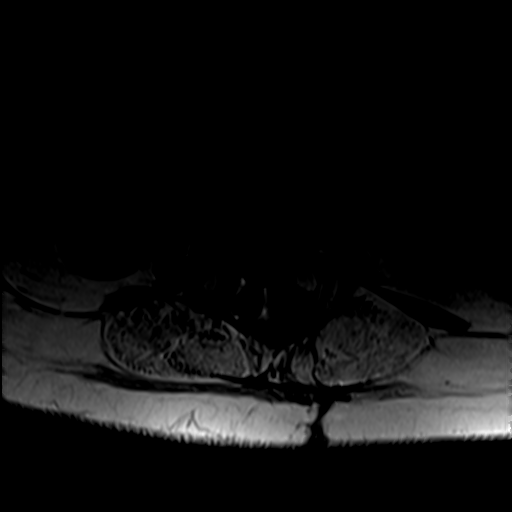
[im 32/38]
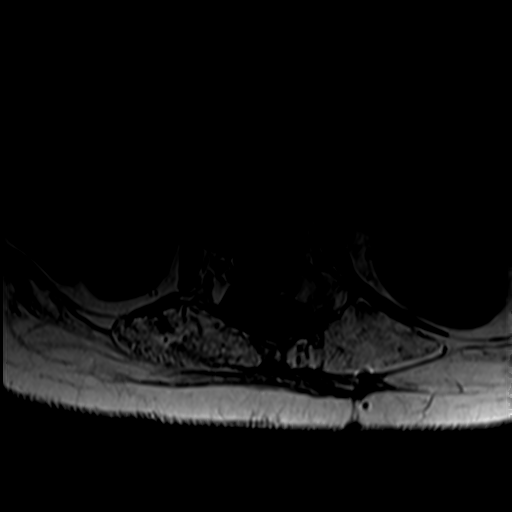

[28 of 48 positions shown; findings below may reference images not displayed]

FINDINGS: Segmentation:  Sacralization of L5.

Alignment: Unchanged mild dextroscoliosis of the upper lumbar spine.
No listhesis.

Vertebrae: No fracture, evidence of discitis, or bone lesion. Solid
posterior fusion of the visualized thoracolumbar spine. Interbody
fusion at T11-T12 and T12-L1.

Conus medullaris and cauda equina: Conus extends to the T12-L1
level. Conus and cauda equina appear normal.

Paraspinal and other soft tissues: Paraspinous postsurgical changes
and muscle atrophy.

Disc levels:

T12-L1:  Interbody and posterior fusion.  No stenosis.

L1-L2:  Posterior fusion.  Negative disc.  No stenosis.

L2-L3: Posterior fusion. 9 x 7 x 12 mm fluid collection along the
posterior epidural space, likely a seroma. Negative disc. No
stenosis.

L3-L4:  Posterior fusion.  Negative disc.  No stenosis.

L4-L5:  Posterior fusion.  Negative disc.  No stenosis.

L5-S1: Transitional level. Posterior fusion. Negative disc. No
stenosis.
IMPRESSION: 1. Posterior fusion of the visualized thoracolumbar spine. No acute
abnormality or significant degenerative changes.

## 2021-06-24 ENCOUNTER — Telehealth: Payer: Self-pay | Admitting: Pulmonary Disease

## 2021-06-24 ENCOUNTER — Ambulatory Visit: Payer: Medicare Other | Admitting: Cardiology

## 2021-06-24 NOTE — Telephone Encounter (Signed)
Spoke with the pt  She is c/o scratchy throat She states not having any other symptoms, no fever or increased SOB She is scheduled with Dr Craige Cotta for his next available and will call her PCP or urgent care sooner if needed

## 2021-06-24 NOTE — Telephone Encounter (Signed)
Pt also has itchy throat, has been taking mucinex around the clock for this.

## 2021-06-24 NOTE — Telephone Encounter (Signed)
Pt calling because it has been awhile(09/2018) since she has been seen. Pt states she has been having more difficulty breathing and having to suction more often. Pt does have a trach. Did not have these issues while she had covid, was on abx for trach infection when she had covid,felt like abx was working. Now symptoms have returned. PCP doesn't want to do another sputum culture and ENT wants pt to get a pulmonary exam.Please advise  [phone]

## 2021-06-28 ENCOUNTER — Ambulatory Visit: Payer: Medicare Other | Admitting: Cardiology

## 2021-07-04 ENCOUNTER — Ambulatory Visit: Payer: Medicare Other | Admitting: Gastroenterology

## 2021-07-05 ENCOUNTER — Encounter: Payer: Self-pay | Admitting: Cardiology

## 2021-07-05 ENCOUNTER — Ambulatory Visit (INDEPENDENT_AMBULATORY_CARE_PROVIDER_SITE_OTHER): Payer: Medicare Other | Admitting: Cardiology

## 2021-07-05 ENCOUNTER — Other Ambulatory Visit: Payer: Self-pay

## 2021-07-05 VITALS — BP 116/64 | HR 103 | Ht 62.0 in

## 2021-07-05 DIAGNOSIS — R000 Tachycardia, unspecified: Secondary | ICD-10-CM | POA: Diagnosis not present

## 2021-07-05 DIAGNOSIS — R002 Palpitations: Secondary | ICD-10-CM | POA: Diagnosis not present

## 2021-07-05 MED ORDER — METOPROLOL SUCCINATE ER 25 MG PO TB24: 25.0000 mg | ORAL_TABLET | Freq: Two times a day (BID) | ORAL | 3 refills | Status: DC

## 2021-07-05 NOTE — Patient Instructions (Signed)
Medication Instructions:  Increase Metoprolol succinate to 25 mg two times a day Your physician recommends that you continue on your current medications as directed. Please refer to the Current Medication list given to you today.  Labwork: None ordered.  Testing/Procedures: None ordered.  Follow-Up: Your physician wants you to follow-up in: 12 months with Steffanie Dunn, MD     You will receive a reminder letter in the mail two months in advance. If you don't receive a letter, please call our office to schedule the follow-up appointment.   Any Other Special Instructions Will Be Listed Below (If Applicable).  If you need a refill on your cardiac medications before your next appointment, please call your pharmacy.

## 2021-07-05 NOTE — Progress Notes (Signed)
Electrophysiology Office Follow up Visit Note:    Date:  07/05/2021   ID:  Veronica Huff, DOB 11/13/1986, MRN [id number]  PCP:  Annita Brod, MD  [facility] Cardiologist:  None  [facility] Electrophysiologist:  Lanier Prude, MD    Interval History:    Veronica Huff is a 35 y.o. female who presents for a follow up visit. They were last seen in a vitual appointment on 01/04/2021. She was recently started on metoprolol to help with her tachycardia. She tells me that this medication has helped her symptoms but she still notices increased rates at times. No clear triggers.     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   Depression    Zoloft has helped-not having any current depression   Edema extremities    Feet and legs-mostly in feet   Headache(784.0)    Prior to imipramine-none currently   Pneumonia    Quadriplegia (HCC)    Secondary to stroke at age 73-has trach-lives with boyfriend-motorized w/c-joystick to chin to drive w/c--I & O cath thru suprapubic stoma - has nursing care 16 hrs daily and boyfriend able to help pt the other hours of day.   Recurrent UTI (urinary tract infection)    Due to suprapubic stoma, repetitive catheterizations   Sleep apnea    On ventilator at night -has oxygen but rarely needs unless she has a cold and sats drop   Stroke [facility])     Past Surgical History:  Procedure Laterality Date   BLADDER SURGERY  2009,2012   BRONCHOSCOPY  2006   CHOLECYSTECTOMY  11/18/2012   Procedure: LAPAROSCOPIC CHOLECYSTECTOMY;  Surgeon: Axel Filler, MD;  Location: [facility] ORS;  Service: General;  Laterality: N/A;   ESOPHAGOGASTRODUODENOSCOPY     GASTROSTOMY TUBE PLACEMENT  2000   ILEOSTOMY  2006   MICROLARYNGOSCOPY WITH LASER AND BALLOON DILATION N/A 09/27/2016   Procedure: MICROLARYNGOSCOPY WITH LASER AND BALLOON  DILATION;  Surgeon: Christia Reading, MD;  Location: [facility] OR;  Service: ENT;  Laterality: N/A;  micro direct laryngoscopy with co2 laser balloon dilation of trachea   REMOVAL OF GASTROSTOMY TUBE     SPINE SURGERY  2004,2011   TENDON REPAIR  2012   Left wrist   THROAT SURGERY  2004   TRACHEOSTOMY  2000   wirst surgery Left    WISDOM TOOTH EXTRACTION  2008    Current Medications: Current Meds  Medication Sig   baclofen (LIORESAL) 20 MG tablet TAKE TWO TABLETS BY MOUTH FOUR TIMES A DAY   busPIRone (BUSPAR) 5 MG tablet Take 5 mg by mouth 3 (three) times daily.   clotrimazole-betamethasone (LOTRISONE) cream Apply 1 application topically daily as needed (for irritation). On labium   dantrolene (DANTRIUM) 100 MG capsule TAKE 1 CAPSULE BY MOUTH 3 TIMES DAILY.   diphenhydrAMINE (BENADRYL) 25 MG tablet Take 1 tablet (25 mg total) by mouth every 6 (six) hours as needed for itching or allergies. Take with Levofloxacin to prevent allergic reaction   Docusate Sodium (DSS) 100 MG CAPS Take 100 mg by mouth in the morning, at noon, and at bedtime.   EPIPEN 2-PAK 0.3 MG/0.3ML SOAJ injection Inject 0.3 mg into the muscle daily as needed for anaphylaxis.   fluticasone (FLONASE) 50 MCG/ACT nasal spray INSERT 2 SPRAYS IN EACH NOSTRIL EVERY DAY FOR STUFFY NOSE OR DRAINAGE  gabapentin (NEURONTIN) 300 MG capsule Take 300 mg by mouth 3 (three) times daily.   hydrocortisone cream 0.5 % Apply 1 application topically 2 (two) times daily. To hairline and brows   ibuprofen (ADVIL) 200 MG tablet Take 400 mg by mouth every 6 (six) hours as needed for fever, headache, mild pain, moderate pain or cramping.   imipramine (TOFRANIL) 50 MG tablet TAKE 2 TABLETS (100 MG TOTAL) BY MOUTH AT BEDTIME.   ketorolac (ACULAR) 0.5 % ophthalmic solution Place 1 drop into both eyes daily.    lactulose (CHRONULAC) 10 GM/15ML solution Take 10 g by mouth daily as needed for mild constipation.   levocetirizine (XYZAL) 5 MG tablet TAKE 1 TABLET BY  MOUTH EVERY DAY AS NEEDED   medroxyPROGESTERone (DEPO-PROVERA) 150 MG/ML injection Inject 150 mg into the muscle every 3 (three) months.    metoprolol succinate (TOPROL XL) 25 MG 24 hr tablet Take 1 tablet (25 mg total) by mouth in the morning and at bedtime.   Multiple Vitamin (MULTIVITAMIN WITH MINERALS) TABS tablet Take 1 tablet by mouth daily.   mupirocin ointment (BACTROBAN) 2 % 1 application by Tracheal Tube route daily. Applies to trach-stoma site   nitrofurantoin (MACRODANTIN) 50 MG capsule Take 50 mg by mouth at bedtime.   nystatin (MYCOSTATIN) powder Apply 1 application topically 2 (two) times daily as needed (skin irritation). Small amount . Rash   polyethylene glycol powder (MIRALAX) 17 GM/SCOOP powder Start taking 1 capful 3 times a day. Slowly cut back as needed until you have normal bowel movements.   senna (SENOKOT) 8.6 MG tablet Take 1 tablet by mouth every other day.   sertraline (ZOLOFT) 100 MG tablet Take 100 mg by mouth at bedtime.   solifenacin (VESICARE) 10 MG tablet Take 10 mg by mouth daily.   sucralfate (CARAFATE) 1 GM/10ML suspension Take 1 g by mouth 3 (three) times daily.    topiramate (TOPAMAX) 50 MG tablet Take 1 tablet (50 mg total) by mouth at bedtime.   [DISCONTINUED] metoprolol succinate (TOPROL XL) 25 MG 24 hr tablet Take 0.5 tablets (12.5 mg total) by mouth every 12 (twelve) hours. Take at 12:00 am and 12:00 pm.     Allergies:   Bee venom, Amoxicillin, Ciprofloxacin, Tobramycin, Erythromycin, Trazodone, Trazodone and nefazodone, Morphine and related, and Vantin [cefpodoxime]   Social History   Socioeconomic History   Marital status: Single    Spouse name: Not on file   Number of children: Not on file   Years of education: college    Highest education level: Not on file  Occupational History   Occupation: disabled  Tobacco Use   Smoking status: Never   Smokeless tobacco: Never  Vaping Use   Vaping Use: Never used  Substance and Sexual Activity    Alcohol use: No    Alcohol/week: 0.0 standard drinks   Drug use: No    Types: Marijuana   Sexual activity: Yes    Birth control/protection: None  Other Topics Concern   Not on file  Social History Narrative   08/29/17 Lives at home with boyfriend, Has a Environmental manager that helps to care for her    Drinks no caffeine    Social Determinants of Corporate investment banker Strain: Not on file  Food Insecurity: Not on file  Transportation Needs: Not on file  Physical Activity: Not on file  Stress: Not on file  Social Connections: Not on file     Family History: The patient's family history includes  Cancer in her maternal aunt; Diabetes in her maternal grandfather and mother; Heart disease in her maternal grandfather and paternal grandfather; Hypertension in her mother; Other in her sister; Varicose Veins in her mother.  ROS:   Please see the history of present illness.    All other systems reviewed and are negative.  EKGs/Labs/Other Studies Reviewed:    The following studies were reviewed today:   EKG:  The ekg ordered today demonstrates sinus tachycardia with a rate of 103 bpm.  Recent Labs: 11/01/2020: ALT 16 05/04/2021: BUN 11; Creatinine, Ser 0.30; Hemoglobin 14.5; Platelets 162; Potassium 3.5; Sodium 137  Recent Lipid Panel No results found for: CHOL, TRIG, HDL, CHOLHDL, VLDL, LDLCALC, LDLDIRECT  Physical Exam:    VS:  BP 116/64   Pulse (!) 103   Ht 5\' 2"  (1.575 m)   SpO2 95%   BMI 21.95 kg/m     Wt Readings from Last 3 Encounters:  01/04/21 120 lb (54.4 kg)  11/10/20 120 lb (54.4 kg)  11/01/20 120 lb (54.4 kg)     GEN: Female in NAD in wheelchairHEENT: Normal NECK: No JVD; No carotid bruits LYMPHATICS: No lymphadenopathy CARDIAC: tachycardic, regular rhythm, no murmurs, rubs, gallops RESPIRATORY:  Clear to auscultation without rales, wheezing or rhonchi  ABDOMEN: Soft, non-tender, non-distended MUSCULOSKELETAL:  No edema; No deformity  SKIN: Warm and  dry NEUROLOGIC:  Alert and oriented x 3 PSYCHIATRIC:  Normal affect   ASSESSMENT:    1. Palpitations   2. Sinus tachycardia    PLAN:    In order of problems listed above:   1. Palpitations 2. Sinus tachycardia Improved but not completely resolved on metoprolol 12.5mg  PO BID. I have recommended she increase the dose to 25mg  PO BID. If she has some improvement but not fully resolved, can increase further to 50mg  PO BID after 1 dose of the 25mg  BID dose.    I will plan to see her back in 12 months.    Total time spent with patient today 20 minutes. This includes reviewing records, evaluating the patient and coordinating care.   Medication Adjustments/Labs and Tests Ordered: Current medicines are reviewed at length with the patient today.  Concerns regarding medicines are outlined above.  Orders Placed This Encounter  Procedures   EKG 12-Lead   Meds ordered this encounter  Medications   metoprolol succinate (TOPROL XL) 25 MG 24 hr tablet    Sig: Take 1 tablet (25 mg total) by mouth in the morning and at bedtime.    Dispense:  180 tablet    Refill:  3     Signed, 11/03/20, MD, [facility], [facility] 07/05/2021 10:36 PM    Electrophysiology [facility]

## 2021-07-13 ENCOUNTER — Ambulatory Visit: Payer: Medicare Other

## 2021-07-21 ENCOUNTER — Telehealth: Payer: Self-pay | Admitting: Pulmonary Disease

## 2021-07-21 NOTE — Telephone Encounter (Signed)
Called and spoke with Dr. Jenne Pane. He stated that he had been seeing the patient for the past few months. She has a trach in place that has been there since at least the patient's early childhood due to a possible stroke. For the past few weeks, the patient's PCP has been ordered sputum cultures on her since she has been producing a yellowish tint phlegm from her trach. The last culture results that he received showed it was resistant to Cipro and Levaquin. The only antibiotics it is not resistant to are IV antibiotics.   He is aware that the patient has an appt to re-establish with Dr. Craige Cotta later on this month. But he wanted to know if Dr. Craige Cotta had already recommendations about possible antibiotics/picc line. He stated that the patient does not have any complaints at all, she feels fine.   I advised him that Dr. Craige Cotta was not available today and I could send the message to the APP of the day. He stated that he was ok for waiting until Dr. Craige Cotta was available tomorrow.   Dr. Craige Cotta, can you please advise? The number at the top of the chart is Dr. Jenne Pane' direct number.  Thanks!

## 2021-07-22 ENCOUNTER — Other Ambulatory Visit: Payer: Self-pay | Admitting: Neurology

## 2021-07-27 NOTE — Telephone Encounter (Signed)
Patient checking on the call with Dr. Jenne Pane.Patient phone number is [phone].

## 2021-07-27 NOTE — Telephone Encounter (Signed)
ATC, left VM. Looks like we have not heard back from Dr. Craige Cotta regarding recs. Will send message to him to follow up.  Dr. Craige Cotta, please advise on recs regarding Dr. Jenne Pane. Thanks!

## 2021-08-02 NOTE — Telephone Encounter (Signed)
Will address at pt visit on 08/10/21.

## 2021-08-03 NOTE — Progress Notes (Signed)
Chart reviewed, agree above plan ?

## 2021-08-10 ENCOUNTER — Ambulatory Visit: Payer: Medicare Other | Admitting: Primary Care

## 2021-08-10 ENCOUNTER — Ambulatory Visit: Payer: Medicare Other | Admitting: Pulmonary Disease

## 2021-08-11 ENCOUNTER — Encounter: Payer: Self-pay | Admitting: Internal Medicine

## 2021-08-12 ENCOUNTER — Ambulatory Visit: Payer: Medicare Other | Admitting: Gastroenterology

## 2021-09-09 ENCOUNTER — Encounter: Payer: Self-pay | Admitting: Pulmonary Disease

## 2021-09-09 ENCOUNTER — Ambulatory Visit: Payer: Medicare Other | Admitting: Pulmonary Disease

## 2021-09-09 ENCOUNTER — Other Ambulatory Visit: Payer: Self-pay

## 2021-09-09 ENCOUNTER — Ambulatory Visit (INDEPENDENT_AMBULATORY_CARE_PROVIDER_SITE_OTHER): Payer: Medicare Other | Admitting: Pulmonary Disease

## 2021-09-09 ENCOUNTER — Ambulatory Visit (INDEPENDENT_AMBULATORY_CARE_PROVIDER_SITE_OTHER): Payer: Medicare Other

## 2021-09-09 VITALS — BP 80/60 | HR 88 | Temp 98.5°F

## 2021-09-09 DIAGNOSIS — I639 Cerebral infarction, unspecified: Secondary | ICD-10-CM | POA: Diagnosis not present

## 2021-09-09 DIAGNOSIS — G4730 Sleep apnea, unspecified: Secondary | ICD-10-CM

## 2021-09-09 DIAGNOSIS — J9611 Chronic respiratory failure with hypoxia: Secondary | ICD-10-CM | POA: Diagnosis not present

## 2021-09-09 DIAGNOSIS — J40 Bronchitis, not specified as acute or chronic: Secondary | ICD-10-CM | POA: Diagnosis not present

## 2021-09-09 NOTE — Patient Instructions (Signed)
Chest xray today  Will arrange for sputum culture  Will get a copy of your home ventilator report  Follow up in 4 weeks with video visit

## 2021-09-09 NOTE — Telephone Encounter (Signed)
VS pt sent this for her appt today.

## 2021-09-09 NOTE — Progress Notes (Signed)
[facility]  Chief Complaint  Patient presents with   Follow-up    Lung infection    Past Surgical History:  She  has a past surgical history that includes Ileostomy (2006); Esophagogastroduodenoscopy; Spine surgery (9735,3299); Tracheostomy (2000); Gastrostomy tube placement (2000); Bronchoscopy (2006); Throat surgery (2004); Wisdom tooth extraction (2008); Bladder surgery (2426,8341); Tendon repair (2012); Removal of gastrostomy tube; Cholecystectomy (11/18/2012); wirst surgery (Left); and Microlaryngoscopy with laser and balloon dilation (N/A, 09/27/2016).  Past Medical History:  CVA with brain stem involvement, Quadriplegia, Depression, Neurogenic bladder, Recurrent UTI, GERD, Scoliosis  Constitutional:  BP (!) 80/60 (BP Location: Right Arm, Cuff Size: Normal)   Pulse 88   Temp 98.5 F (36.9 C) (Oral)   SpO2 95%   Brief Summary:  Veronica Huff is a 35 y.o. female withchronic respiratory failure and sleep disordered breathing 2nd to brain stem infarct at age 19 with resulting quadriplegia.  She has chronic tracheostomy with nocturnal ventilator support.      Subjective:   She is here with her caregiver.  Uses Astral home vent >> SIMV rate 7, Vt 350, PS 10, PEEP 5, I time 1, High pressure alarm 45.  Chest xray from 05/03/21 showed tracheostomy in good position with decreased lung volumes.  Sputum culture from 07/12/21 showed Pseudomonas aeruginosa with resistance to cipro, levaquin, and imipenem.  She was treated with keflex for UTI and her respiratory symptoms improved.    She still has chest congestion and bringing up sputum.  No fever.    Physical Exam:   Appearance - sitting in wheelchair  ENMT - trach site clean  Respiratory - equal breath sounds bilaterally, no wheezing or rales  CV - s1s2 regular rate and rhythm, no murmurs  Ext - decreased muscle bulk  Skin - no rashes  Psych - normal mood and affect   Sleep  Tests:  ONO with home vent 10/12/16 >> test time 3 hrs 51 min.  Mean SpO2 92.9%, low SpO2 74%.  Spent 14 min with SpO2 < 88%.  Social History:  She  reports that she has never smoked. She has never used smokeless tobacco. She reports that she does not drink alcohol and does not use drugs.  Family History:  Her family history includes Cancer in her maternal aunt; Diabetes in her maternal grandfather and mother; Heart disease in her maternal grandfather and paternal grandfather; Hypertension in her mother; Other in her sister; Varicose Veins in her mother.     Assessment/Plan:   Tracheobronchitis with previous sputum culture showing Pseudomonas. - will repeat chest xray and sputum culture today - explained that if she is found to have resistant organisms causing infection, then she might need ID referral  Sleep disordered breathing with hx of obstructive sleep apnea. Chronic hypoxic respiratory failure. - will get copy of her Astral home vent report and then determine if adjustments are needed to her set up   Tracheostomy status. - followed by Dr. 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 - followed by Dr. Levert Feinstein with [facility]   Neurogenic bladder. - followed by Urology at [facility]  Time Spent Involved in Patient Care on Day of Examination:  31 minutes  Follow up:   Patient Instructions  Chest xray today  Will arrange for sputum culture  Will get a copy of your home ventilator report  Follow up in 4 weeks with video visit  Medication List:  Allergies as of 09/09/2021       Reactions   Bee Venom Shortness Of Breath, Swelling   SHOCK   Amoxicillin Hives, Itching   Tolerates Keflex, Rocephin Has patient had a PCN reaction causing immediate rash, facial/tongue/throat swelling, SOB or lightheadedness with hypotension: no Has patient had a PCN reaction causing severe rash involving mucus membranes or skin necrosis: no Has  patient had a PCN reaction that required hospitalization: no Has patient had a PCN reaction occurring within the last 10 years: unknown If all of the above answers are "NO", then may proceed with Cephalosporin use.   Ciprofloxacin Hives, Rash   Tobramycin Hives, Rash   Erythromycin Other (See Comments)   UNSPECIFIED REACTION    Trazodone Other (See Comments)   UNSPECIFIED REACTION  Eyes shake   Trazodone And Nefazodone    UNSPECIFIED REACTION    Morphine And Related Other (See Comments)   "GOES CRAZY"   Vantin [cefpodoxime] Palpitations        Medication List        Accurate as of September 09, 2021  4:11 PM. If you have any questions, ask your nurse or doctor.          STOP taking these medications    sertraline 100 MG tablet Commonly known as: ZOLOFT Stopped by: Coralyn Helling, MD       TAKE these medications    baclofen 20 MG tablet Commonly known as: LIORESAL TAKE TWO TABLETS BY MOUTH FOUR TIMES A DAY   busPIRone 5 MG tablet Commonly known as: BUSPAR Take 5 mg by mouth 3 (three) times daily.   clotrimazole-betamethasone cream Commonly known as: LOTRISONE Apply 1 application topically daily as needed (for irritation). On labium   dantrolene 100 MG capsule Commonly known as: DANTRIUM TAKE 1 CAPSULE BY MOUTH 3 TIMES DAILY.   diphenhydrAMINE 25 MG tablet Commonly known as: BENADRYL Take 1 tablet (25 mg total) by mouth every 6 (six) hours as needed for itching or allergies. Take with Levofloxacin to prevent allergic reaction   DSS 100 MG Caps Take 100 mg by mouth in the morning, at noon, and at bedtime.   EpiPen 2-Pak 0.3 mg/0.3 mL Soaj injection Generic drug: EPINEPHrine Inject 0.3 mg into the muscle daily as needed for anaphylaxis.   fluticasone 50 MCG/ACT nasal spray Commonly known as: FLONASE INSERT 2 SPRAYS IN EACH NOSTRIL EVERY DAY FOR STUFFY NOSE OR DRAINAGE   gabapentin 300 MG capsule Commonly known as: NEURONTIN Take 300 mg by mouth 3  (three) times daily.   hydrocortisone cream 0.5 % Apply 1 application topically 2 (two) times daily. To hairline and brows   ibuprofen 200 MG tablet Commonly known as: ADVIL Take 400 mg by mouth every 6 (six) hours as needed for fever, headache, mild pain, moderate pain or cramping.   imipramine 50 MG tablet Commonly known as: TOFRANIL TAKE 2 TABLETS (100 MG TOTAL) BY MOUTH AT BEDTIME.   ketorolac 0.5 % ophthalmic solution Commonly known as: ACULAR Place 1 drop into both eyes daily.   lactulose 10 GM/15ML solution Commonly known as: CHRONULAC Take 10 g by mouth daily as needed for mild constipation.   levocetirizine 5 MG tablet Commonly known as: XYZAL TAKE 1 TABLET BY MOUTH EVERY DAY AS NEEDED   medroxyPROGESTERone 150 MG/ML injection Commonly known as: DEPO-PROVERA Inject 150 mg into the muscle every 3 (three) months.   metoprolol succinate 25 MG 24 hr tablet Commonly known as: Toprol XL Take 1 tablet (25 mg  total) by mouth in the morning and at bedtime.   multivitamin with minerals Tabs tablet Take 1 tablet by mouth daily.   mupirocin ointment 2 % Commonly known as: BACTROBAN 1 application by Tracheal Tube route daily. Applies to trach-stoma site   nitrofurantoin 50 MG capsule Commonly known as: MACRODANTIN Take 50 mg by mouth at bedtime.   nystatin powder Commonly known as: MYCOSTATIN/NYSTOP Apply 1 application topically 2 (two) times daily as needed (skin irritation). Small amount . Rash   polyethylene glycol powder 17 GM/SCOOP powder Commonly known as: MiraLax Start taking 1 capful 3 times a day. Slowly cut back as needed until you have normal bowel movements.   senna 8.6 MG tablet Commonly known as: SENOKOT Take 1 tablet by mouth every other day.   solifenacin 10 MG tablet Commonly known as: VESICARE Take 10 mg by mouth daily.   sucralfate 1 GM/10ML suspension Commonly known as: CARAFATE Take 1 g by mouth 3 (three) times daily.   topiramate 50 MG  tablet Commonly known as: TOPAMAX Take 1 tablet (50 mg total) by mouth at bedtime.        Signature:  Coralyn Helling, MD [facility]monary/Critical Care Pager - [phone] 09/09/2021, 4:11 PM

## 2021-09-09 NOTE — Addendum Note (Signed)
Addended by: Geraldo Docker on: 09/09/2021 04:57 PM   Modules accepted: Orders

## 2021-09-12 ENCOUNTER — Ambulatory Visit: Payer: Medicare Other | Admitting: Obstetrics & Gynecology

## 2021-09-12 DIAGNOSIS — J9611 Chronic respiratory failure with hypoxia: Secondary | ICD-10-CM

## 2021-09-13 NOTE — Telephone Encounter (Signed)
Please send order to [facility] to arrange for cough assist device that can be attached to her tracheostomy.

## 2021-09-13 NOTE — Telephone Encounter (Signed)
Dr Craige Cotta, please advise on email from pt, thanks!  Dear Dr. Craige Cotta, On my appointment on Friday, I forgot (Even though you asked me if everything was okay! Lol), to ask if you could write me a prescription for a cough machine or coughalator and fax it to [facility]. I would really appreciate it as I'm eager to try it. The fax # is [phone]   Veronica Huff

## 2021-09-20 ENCOUNTER — Other Ambulatory Visit: Payer: Self-pay

## 2021-09-20 ENCOUNTER — Other Ambulatory Visit: Payer: Medicare Other

## 2021-09-20 DIAGNOSIS — J40 Bronchitis, not specified as acute or chronic: Secondary | ICD-10-CM

## 2021-09-21 ENCOUNTER — Telehealth: Payer: Self-pay | Admitting: Pulmonary Disease

## 2021-09-21 ENCOUNTER — Other Ambulatory Visit: Payer: Medicare Other

## 2021-09-21 DIAGNOSIS — J40 Bronchitis, not specified as acute or chronic: Secondary | ICD-10-CM

## 2021-09-21 NOTE — Addendum Note (Signed)
Addended by: Elita Boone E on: 09/21/2021 04:29 PM   Modules accepted: Orders

## 2021-09-21 NOTE — Telephone Encounter (Signed)
Call made to patient, confirmed DOB. Made aware one sample is needed however if she can bring another one it can't hurt anything.   Voiced understanding.   Nothing further needed at this time.

## 2021-09-22 ENCOUNTER — Telehealth: Payer: Self-pay | Admitting: Pulmonary Disease

## 2021-09-23 LAB — RESPIRATORY CULTURE OR RESPIRATORY AND SPUTUM CULTURE
MICRO NUMBER:: 12578177
SPECIMEN QUALITY:: ADEQUATE

## 2021-09-23 NOTE — Telephone Encounter (Signed)
AJ can you check to see if VS has received any papers from [facility] on this pt.  thanks

## 2021-09-24 LAB — RESPIRATORY CULTURE OR RESPIRATORY AND SPUTUM CULTURE
MICRO NUMBER:: 12583780
SPECIMEN QUALITY:: ADEQUATE

## 2021-09-26 MED ORDER — LEVOFLOXACIN 500 MG PO TABS: 500.0000 mg | ORAL_TABLET | Freq: Every day | ORAL | 0 refills | Status: DC

## 2021-09-26 NOTE — Telephone Encounter (Signed)
I sent a message on 09/23/21 to have script for levaquin 500 mg daily for 10 days sent.  Please send script for this.

## 2021-09-26 NOTE — Telephone Encounter (Signed)
VS please advise.  thanks 

## 2021-09-27 NOTE — Telephone Encounter (Signed)
VS please advise on the mychart message from the pt.  thanks

## 2021-09-27 NOTE — Telephone Encounter (Signed)
I have attempted to call the pt and have LM on VM for her to call back.

## 2021-09-27 NOTE — Telephone Encounter (Signed)
Patient is returning phone call. Patient phone number is [phone].

## 2021-10-05 NOTE — Telephone Encounter (Signed)
Called the pt  She says that she will follow with ENT  She said that she does not need any orders sent to [facility], just needs [place] to fax ov note I called [facility] at [phone]- confirmed they are just needing records and faxed ov note to them at [phone]  Adanya aware  Nothing further needed

## 2021-10-05 NOTE — Telephone Encounter (Signed)
Patient needs trach and oxygen supplies. Patient uses [facility] for supplies. States they need office notes. Patient phone number is [phone].

## 2021-10-05 NOTE — Telephone Encounter (Signed)
VS please advise. Thanks! 

## 2021-10-05 NOTE — Telephone Encounter (Signed)
Okay to send order for trach and oxygen supplies.  Please clarify with her if she is going to continue follow up with Dr. Christia Reading with ENT for management of her tracheostomy.  If not, then please put referral in to have her meet Anders Simmonds in our tracheostomy clinic.

## 2021-10-06 ENCOUNTER — Telehealth: Payer: Self-pay | Admitting: Pulmonary Disease

## 2021-10-06 ENCOUNTER — Ambulatory Visit: Payer: Medicare Other | Admitting: Neurology

## 2021-10-06 NOTE — Telephone Encounter (Signed)
Called patient but she did not answer. Left message for her to call back. Found a fax number for [facility] in yesterday's telephone note. Will refax the OV notes.   Patient called back. She is aware that I will refax the OV notes.   Nothing further needed at time of call.

## 2021-10-17 ENCOUNTER — Other Ambulatory Visit: Payer: Self-pay

## 2021-10-17 ENCOUNTER — Telehealth (INDEPENDENT_AMBULATORY_CARE_PROVIDER_SITE_OTHER): Payer: Medicare Other | Admitting: Pulmonary Disease

## 2021-10-17 ENCOUNTER — Encounter: Payer: Self-pay | Admitting: Pulmonary Disease

## 2021-10-17 DIAGNOSIS — J9611 Chronic respiratory failure with hypoxia: Secondary | ICD-10-CM | POA: Diagnosis not present

## 2021-10-17 DIAGNOSIS — I639 Cerebral infarction, unspecified: Secondary | ICD-10-CM

## 2021-10-17 DIAGNOSIS — G4730 Sleep apnea, unspecified: Secondary | ICD-10-CM

## 2021-10-17 DIAGNOSIS — J40 Bronchitis, not specified as acute or chronic: Secondary | ICD-10-CM | POA: Diagnosis not present

## 2021-10-17 DIAGNOSIS — A498 Other bacterial infections of unspecified site: Secondary | ICD-10-CM

## 2021-10-17 MED ORDER — PREDNISONE 10 MG PO TABS: ORAL_TABLET | ORAL | 0 refills | Status: AC

## 2021-10-17 NOTE — Progress Notes (Signed)
Pulmonary, Critical Care, and Sleep Medicine  Chief Complaint  Patient presents with   Follow-up    On vent with Cpap settings.     Past Surgical History:  Veronica Huff  has a past surgical history that includes Ileostomy (2006); Esophagogastroduodenoscopy; Spine surgery [id number]); Tracheostomy (2000); Gastrostomy tube placement (2000); Bronchoscopy (2006); Throat surgery (2004); Wisdom tooth extraction (2008); Bladder surgery [id number]); Tendon repair (2012); Removal of gastrostomy tube; Cholecystectomy (11/18/2012); wirst surgery (Left); and Microlaryngoscopy with laser and balloon dilation (N/A, 09/27/2016).  Past Medical History:  CVA with brain stem involvement, Quadriplegia, Depression, Neurogenic bladder, Recurrent UTI, GERD, Scoliosis  Constitutional:  There were no vitals taken for this visit.  Deferred.  Brief Summary:  Veronica Huff is a 35 y.o. female withchronic respiratory failure and sleep disordered breathing 2nd to brain stem infarct at age 89 with resulting quadriplegia.  Veronica Huff has chronic tracheostomy with nocturnal ventilator support.      Subjective:   Virtual Visit via Video Note  I connected with Veronica Huff on 10/17/21 at  3:30 PM EST by a video enabled telemedicine application and verified that I am speaking with the correct person using two identifiers.  Location: Patient: home Provider: medical office   I discussed the limitations of evaluation and management by telemedicine and the availability of in person appointments. The patient expressed understanding and agreed to proceed.  History of Present Illness:  Sputum from 09/21/21 showed Pseudomonas aeruginosa that was pan-sensitive.  Veronica Huff was treated with levofloxacin 500 mg for 10 days.    Chest congestion is better.   Sputum now clear.  Veronica Huff is having some wheeze now and getting phlegm drainage around her trach.  Veronica Huff feels this is coming from her chest.  Veronica Huff is not having fever or  hemoptysis.   Physical Exam:   Appears well.  Sitting in her wheelchair.  Speech is clear.   Sleep Tests:  ONO with home vent 10/12/16 >> test time 3 hrs 51 min.  Mean SpO2 92.9%, low SpO2 74%.  Spent 14 min with SpO2 < 88%.  Social History:  Veronica Huff  reports that Veronica Huff has never smoked. Veronica Huff has never used smokeless tobacco. Veronica Huff reports that Veronica Huff does not drink alcohol and does not use drugs.  Family History:  Her family history includes Cancer in her maternal aunt; Diabetes in her maternal grandfather and mother; Heart disease in her maternal grandfather and paternal grandfather; Hypertension in her mother; Other in her sister; Varicose Veins in her mother.     Assessment/Plan:   Tracheobronchitis with previous sputum culture showing Pseudomonas. - completed antibiotics in October - will give her tapering course of prednisone to see if this improves persistent chest congestion and wheezing - will need repeat chest xray at her next visit  Sleep disordered breathing with hx of obstructive sleep apnea. Chronic hypoxic respiratory failure. - continue Astral home vent SIMV rate 7, Vt 350, PS 10, PEEP 5, I time 1, High pressure alarm 45. - gets supplies from [facility]   Tracheostomy status. - followed by Dr. 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 - followed by Dr. Marcial Pacas with [facility]   Neurogenic bladder. - followed by Urology at [facility]  Time Spent Involved in Patient Care on Day of Examination:   I discussed the assessment and treatment plan with the patient. The patient was provided an opportunity  to ask questions and all were answered. The patient agreed with the plan and demonstrated an understanding of the instructions.   The patient was advised to call back or seek an in-person evaluation if the symptoms worsen or if the condition fails to improve as  anticipated.  I provided 32 minutes of non-face-to-face time during this encounter.  Follow up:   Patient Instructions  Prednisone 10 mg pill >> 3 pills daily for 2 days, 2 pills daily for 2 days, 1 pill daily for 2 days  Follow up in 4 months  Medication List:   Allergies as of 10/17/2021       Reactions   Bee Venom Shortness Of Breath, Swelling   SHOCK   Amoxicillin Hives, Itching   Tolerates Keflex, Rocephin Has patient had a PCN reaction causing immediate rash, facial/tongue/throat swelling, SOB or lightheadedness with hypotension: no Has patient had a PCN reaction causing severe rash involving mucus membranes or skin necrosis: no Has patient had a PCN reaction that required hospitalization: no Has patient had a PCN reaction occurring within the last 10 years: unknown If all of the above answers are "NO", then may proceed with Cephalosporin use.   Ciprofloxacin Hives, Rash   Tobramycin Hives, Rash   Erythromycin Other (See Comments)   UNSPECIFIED REACTION    Trazodone Other (See Comments)   UNSPECIFIED REACTION  Eyes shake   Trazodone And Nefazodone    UNSPECIFIED REACTION    Morphine And Related Other (See Comments)   "GOES CRAZY"   Vantin [cefpodoxime] Palpitations        Medication List        Accurate as of October 17, 2021  3:39 PM. If you have any questions, ask your nurse or doctor.          baclofen 20 MG tablet Commonly known as: LIORESAL TAKE TWO TABLETS BY MOUTH FOUR TIMES A DAY   busPIRone 5 MG tablet Commonly known as: BUSPAR Take 5 mg by mouth 3 (three) times daily.   clotrimazole-betamethasone cream Commonly known as: LOTRISONE Apply 1 application topically daily as needed (for irritation). On labium   dantrolene 100 MG capsule Commonly known as: DANTRIUM TAKE 1 CAPSULE BY MOUTH 3 TIMES DAILY.   diphenhydrAMINE 25 MG tablet Commonly known as: BENADRYL Take 1 tablet (25 mg total) by mouth every 6 (six) hours as needed for itching  or allergies. Take with Levofloxacin to prevent allergic reaction   DSS 100 MG Caps Take 100 mg by mouth in the morning, at noon, and at bedtime.   EpiPen 2-Pak 0.3 mg/0.3 mL Soaj injection Generic drug: EPINEPHrine Inject 0.3 mg into the muscle daily as needed for anaphylaxis.   fluticasone 50 MCG/ACT nasal spray Commonly known as: FLONASE INSERT 2 SPRAYS IN EACH NOSTRIL EVERY DAY FOR STUFFY NOSE OR DRAINAGE   gabapentin 300 MG capsule Commonly known as: NEURONTIN Take 300 mg by mouth 3 (three) times daily.   hydrocortisone cream 0.5 % Apply 1 application topically 2 (two) times daily. To hairline and brows   ibuprofen 200 MG tablet Commonly known as: ADVIL Take 400 mg by mouth every 6 (six) hours as needed for fever, headache, mild pain, moderate pain or cramping.   imipramine 50 MG tablet Commonly known as: TOFRANIL TAKE 2 TABLETS (100 MG TOTAL) BY MOUTH AT BEDTIME.   ketorolac 0.5 % ophthalmic solution Commonly known as: ACULAR Place 1 drop into both eyes daily.   lactulose 10 GM/15ML solution Commonly known as: CHRONULAC Take  10 g by mouth daily as needed for mild constipation.   levocetirizine 5 MG tablet Commonly known as: XYZAL TAKE 1 TABLET BY MOUTH EVERY DAY AS NEEDED   levofloxacin 500 MG tablet Commonly known as: LEVAQUIN Take 1 tablet (500 mg total) by mouth daily.   medroxyPROGESTERone 150 MG/ML injection Commonly known as: DEPO-PROVERA Inject 150 mg into the muscle every 3 (three) months.   metoprolol succinate 25 MG 24 hr tablet Commonly known as: Toprol XL Take 1 tablet (25 mg total) by mouth in the morning and at bedtime.   multivitamin with minerals Tabs tablet Take 1 tablet by mouth daily.   mupirocin ointment 2 % Commonly known as: BACTROBAN 1 application by Tracheal Tube route daily. Applies to trach-stoma site   nitrofurantoin 50 MG capsule Commonly known as: MACRODANTIN Take 50 mg by mouth at bedtime.   nystatin powder Commonly  known as: MYCOSTATIN/NYSTOP Apply 1 application topically 2 (two) times daily as needed (skin irritation). Small amount . Rash   polyethylene glycol powder 17 GM/SCOOP powder Commonly known as: MiraLax Start taking 1 capful 3 times a day. Slowly cut back as needed until you have normal bowel movements.   predniSONE 10 MG tablet Commonly known as: DELTASONE Take 3 tablets (30 mg total) by mouth daily with breakfast for 2 days, THEN 2 tablets (20 mg total) daily with breakfast for 2 days, THEN 1 tablet (10 mg total) daily with breakfast for 2 days. Start taking on: October 17, 2021 Started by: Coralyn Helling, MD   senna 8.6 MG tablet Commonly known as: SENOKOT Take 1 tablet by mouth every other day.   solifenacin 10 MG tablet Commonly known as: VESICARE Take 10 mg by mouth daily.   sucralfate 1 GM/10ML suspension Commonly known as: CARAFATE Take 1 g by mouth 3 (three) times daily.   topiramate 50 MG tablet Commonly known as: TOPAMAX Take 1 tablet (50 mg total) by mouth at bedtime.        Signature:  Coralyn Helling, MD [facility] Pulmonary/Critical Care Pager - [phone] 10/17/2021, 3:39 PM

## 2021-10-17 NOTE — Patient Instructions (Signed)
Prednisone 10 mg pill >> 3 pills daily for 2 days, 2 pills daily for 2 days, 1 pill daily for 2 days  Follow up in 4 months

## 2021-10-19 ENCOUNTER — Other Ambulatory Visit (HOSPITAL_COMMUNITY)
Admission: RE | Admit: 2021-10-19 | Discharge: 2021-10-19 | Disposition: A | Discharge status: Home / Self Care | Payer: Medicare Other | Source: Ambulatory Visit

## 2021-10-19 ENCOUNTER — Ambulatory Visit (INDEPENDENT_AMBULATORY_CARE_PROVIDER_SITE_OTHER): Payer: Medicare Other | Admitting: Family Medicine

## 2021-10-19 ENCOUNTER — Encounter: Payer: Self-pay | Admitting: Family Medicine

## 2021-10-19 ENCOUNTER — Other Ambulatory Visit: Payer: Self-pay

## 2021-10-19 VITALS — BP 108/83 | HR 86 | Wt 147.0 lb

## 2021-10-19 DIAGNOSIS — Z01419 Encounter for gynecological examination (general) (routine) without abnormal findings: Secondary | ICD-10-CM | POA: Insufficient documentation

## 2021-10-19 DIAGNOSIS — Z1151 Encounter for screening for human papillomavirus (HPV): Secondary | ICD-10-CM | POA: Diagnosis not present

## 2021-10-19 NOTE — Progress Notes (Signed)
GYNECOLOGY ANNUAL PREVENTATIVE CARE ENCOUNTER NOTE  Subjective:   Veronica Huff is a 35 y.o. G0P0000 female here for a routine annual gynecologic exam.  Current complaints: here for pap- patient is wheelchair bound- discussed that she can wait until 2023 but patient desires pap today. Reports she is on depo and likes this for pregnancy prevention.   She and partner are in an open relationship and she would like STI screening off pap smear. Partner and home care RN are in the office today.  Denies abnormal vaginal bleeding, discharge, pelvic pain, problems with intercourse or other gynecologic concerns.    Gynecologic History No LMP recorded (lmp unknown). Patient has had an injection. Contraception: Depo-Provera injections Last Pap: 2020. Results were: ASCUS HPV NEG Last mammogram: @51 .   Health Maintenance Due  Topic Date Due   Hepatitis C Screening  Never done   COVID-19 Vaccine (5 - Booster for Moderna series) 10/24/2021    The following portions of the patient's history were reviewed and updated as appropriate: allergies, current medications, past family history, past medical history, past social history, past surgical history and problem list.  Review of Systems Pertinent items are noted in HPI.   Objective:  BP 108/83   Pulse 86   Wt 147 lb (66.7 kg)   LMP  (LMP Unknown)   BMI 26.89 kg/m  CONSTITUTIONAL: Well-developed, well-nourished female in no acute distress.  HENT:  Normocephalic, atraumatic, External right and left ear normal. Oropharynx is clear and moist EYES:  No scleral icterus.  NECK: Normal range of motion, supple, no masses.  Normal thyroid.  SKIN: Skin is warm and dry. No rash noted. Not diaphoretic. No erythema. No pallor. NEUROLOGIC: Alert and oriented to person, place, and time. Normal reflexes, muscle tone coordination. No cranial nerve deficit noted. PSYCHIATRIC: Normal mood and affect. Normal behavior. Normal judgment and thought  content. CARDIOVASCULAR: Normal heart rate noted, regular rhythm. 2+ distal pulses. RESPIRATORY: Effort and breath sounds normal, no problems with respiration noted. BREASTS: Symmetric in size. No masses, skin changes, nipple drainage, or lymphadenopathy. ABDOMEN: Soft,  no distention noted.  No tenderness, rebound or guarding.  PELVIC: Normal appearing external genitalia; normal appearing vaginal mucosa and cervix.  No abnormal discharge noted.  Pap smear obtained.  Normal uterine size, no other palpable masses, no uterine or adnexal tenderness. MUSCULOSKELETAL: Normal range of motion.    Assessment and Plan:  1) Annual gynecologic examination with pap smear:  Will follow up results of pap smear and manage accordingly. STI screen also ordered today.  Routine preventative health maintenance measures emphasized.  2) Contraception counseling: Reviewed all forms of birth control options available including abstinence; over the counter/barrier methods; hormonal contraceptive medication including pill, patch, ring, injection,contraceptive implant; hormonal and nonhormonal IUDs; permanent sterilization options including vasectomy and the various tubal sterilization modalities. Risks and benefits reviewed.  Questions were answered.  Written information was also given to the patient to review.    Patient desires DEPO- declines need for Rx at this time. If the patient calls and need refills please provide 1 year of depo. She will have her next injection in  3 months provided by home health.    She was told to call with any further questions, or with any concerns about this method of contraception.  Emphasized use of condoms 100% of the time for STI prevention.  1. Well woman exam with routine gynecological exam - Cytology - PAP( [place])   Please refer to After Visit  Summary for other counseling recommendations.   Return in about 1 year (around 10/19/2022) for Yearly wellness exam.  Federico Flake, MD, MPH, ABFM Attending [facility]ician [facility] for [facility]

## 2021-10-26 ENCOUNTER — Ambulatory Visit: Payer: Medicare Other | Admitting: Gastroenterology

## 2021-10-26 ENCOUNTER — Telehealth: Payer: Self-pay

## 2021-10-26 ENCOUNTER — Other Ambulatory Visit: Payer: Self-pay

## 2021-10-26 DIAGNOSIS — Z3042 Encounter for surveillance of injectable contraceptive: Secondary | ICD-10-CM

## 2021-10-26 LAB — CYTOLOGY - PAP
Chlamydia: NEGATIVE
Comment: NEGATIVE
Comment: NEGATIVE
Comment: NEGATIVE
Comment: NORMAL
High risk HPV: NEGATIVE
Neisseria Gonorrhea: NEGATIVE
Trichomonas: NEGATIVE

## 2021-10-26 MED ORDER — MEDROXYPROGESTERONE ACETATE 150 MG/ML IM SUSP: 150.0000 mg | INTRAMUSCULAR | 3 refills | Status: DC

## 2021-10-26 NOTE — Telephone Encounter (Signed)
Patient left VM on nurse line requesting Depo Provera refill. Pt also requests PAP results. Per Alvester Morin, MD on 10/19/21 if patient needs Depo refill this should be sent for her. Called pt to notify her I have sent Depo Provera. Explained that PAP results are not back yet.

## 2021-10-27 ENCOUNTER — Telehealth: Payer: Self-pay

## 2021-10-27 NOTE — Telephone Encounter (Addendum)
Duplicate encounter opened in error.

## 2021-10-27 NOTE — Telephone Encounter (Addendum)
-----   Message from Federico Flake, MD sent at 10/27/2021  9:02 AM EST ----- LSIL, HPV negative. Next pap in 1year  Called pt; VM left stating results are available in MyChart. PAP recall placed in Epic.

## 2021-11-03 ENCOUNTER — Encounter: Payer: Self-pay | Admitting: Cardiology

## 2021-11-07 ENCOUNTER — Encounter: Payer: Self-pay | Admitting: Family Medicine

## 2021-11-22 ENCOUNTER — Telehealth (INDEPENDENT_AMBULATORY_CARE_PROVIDER_SITE_OTHER): Payer: Medicare Other | Admitting: Neurology

## 2021-11-22 ENCOUNTER — Encounter: Payer: Self-pay | Admitting: Neurology

## 2021-11-22 DIAGNOSIS — G8250 Quadriplegia, unspecified: Secondary | ICD-10-CM

## 2021-11-22 DIAGNOSIS — R519 Headache, unspecified: Secondary | ICD-10-CM | POA: Diagnosis not present

## 2021-11-22 MED ORDER — DANTROLENE SODIUM 100 MG PO CAPS: ORAL_CAPSULE | ORAL | 5 refills | Status: DC

## 2021-11-22 MED ORDER — IMIPRAMINE HCL 50 MG PO TABS: ORAL_TABLET | ORAL | 11 refills | Status: DC

## 2021-11-22 MED ORDER — TOPIRAMATE 50 MG PO TABS: 50.0000 mg | ORAL_TABLET | Freq: Every day | ORAL | 3 refills | Status: AC

## 2021-11-22 NOTE — Progress Notes (Signed)
Virtual Visit via Video Note  I connected with Veronica Huff on 11/22/21 at  3:45 PM EST by a video enabled telemedicine application and verified that I am speaking with the correct person using two identifiers.  Location: Patient: At her home Provider: In the office    I discussed the limitations of evaluation and management by telemedicine and the availability of in person appointments. The patient expressed understanding and agreed to proceed.  History of Present Illness: Veronica Huff is a 36 year old female, suffered a brainstem stroke at age 63 years old, with residual spastic quadriplegia, neurogenic bladder.  She is accompanied by her boyfriend, referred by my colleague Dr. Leta Baptist for EMG guided botulinum toxin injection for spastic quadriplegia.   She suffered catastrophic brainstem stroke, cryptogenic at age 56, required intubation, ventilation support, ICU stay, extensive rehabilitation, she regained marked improvement, but become wheelchair-bound with significant quadriplegia,   She also has tracheostomy due to weak cough, she will use ventilator at nighttime, require   suctioning through her tracheostomy.  She has bowel suppositories every other day, was regular bowel regimen, she had a suprapubic catheter.   She lives at home, has caregiver 90 hours each week, her boyfriend moved in with her since 2018, she need total assistance, feeding, dressing.  But she can operate her wheelchair using a mouthpiece, enjoying her computer work and reading.   Previously she got Botox injection through rehabilitation Dr. Naaman Plummer, most recent injection was in April 2018, for spastic bilateral lower extremity, use the Botox a.   She complains of bilateral lower extremity with sudden positional change, jackknife contraction of bilateral thigh muscle, uncontrollable muscle jumpy movement.  She does use her upper extremity to gesture when she talks.   I was able to review urology record in October  2018 from [facility] neurogenic bladder, history of augmentation septoplasty, and catheterizable stoma,   MRI of the brain in May 2017, abnormal configuration of the medulla that is related to her previous history of stroke, there is prominence of the CSF space adjacent to the frontal lobe likely represent mild atrophy, no acute abnormality.   UPDATE Apr 11 2018: She is accompanied by her boyfriend at today's clinical visit, she did not respond to previous Botox injection on January 11, 2018, when BOTOX was delivered to bilateral adductor and hip flexor.   I reviewed multiple previous injection note by Dr. Naaman Plummer, injection was placed at bilateral hamstring muscles, patient reported she responded better to that injection pattern,   UPDATE Sept 12 2019: She is accompanied by her home nurse at today's clinical visit, her lower extremity spasticity are mainly forceful bilateral lower extremity extension, intermixed with bilateral hip flexion, lower extremity muscle clonus, bilateral ankle plantarflexion   Virtual Visit via Video on Jan 5th 2021    History of Present Illness: Last injection was in September 2019, she responded well, she does not feel comfortable to continue injection with COVID-19 pandemics,   Update January 03, 2021 SS: Via virtual visit, is homebound, is not going out due to the pandemic.,  Does not feel comfortable continuing Botox injections for spasticity with Dr. Krista Blue.  Not even going out for doctor's appointments.   Hospitalized in December 2021 for dysphagia, barium swallow was unremarkable, ENT signed off.    She lives with her boyfriend, he is her caregiver, have 16 hours nursing via [place]. On Topamax, Tofranil for headaches, doing well as long as on medications, usually stress induced.    Medications as follows:  Dantrum 100 mg 3 times daily Tofranil 50 mg, 2 tablets at bedtime Topamax 50 mg at bedtime Baclofen 20 mg 2 tablets 4 times daily  Update  November 22, 2021 SS: Here today for VV, sacral ulcer is improving.  Wants to get started back on Botox for spasticity, back of thighs and calves. Would feel comfortable coming out in May, when flu season is over. Worsening spasms in legs, then travel to her back. On Topamax, Tofranil for migraines, doing well. 2-3 times a month if that for migraines. Lives with boyfriend, has home health nursing.    Observations/Objective: In wheelchair with voice controlled commands Is alert and oriented, good historian Speech is logical, voice is hoarse  Assessment and Plan: 1.  Spastic quadriplegia, wheelchair dependent since brainstem stroke at age 83 2.  Chronic migraine headache  -Continue current medications, refills were sent in (dantrolene, imipramine, Topamax, baclofen-none needed) -Is ready to get back into see Dr. Krista Blue for EMG guided Xeomin injection for spasticity, would feel comfortable coming after May, has been very cautious about leaving the house due to the pandemic  Follow Up Instructions: With Dr. Krista Blue in 6 months for Zeomin Injections   I discussed the assessment and treatment plan with the patient. The patient was provided an opportunity to ask questions and all were answered. The patient agreed with the plan and demonstrated an understanding of the instructions.   The patient was advised to call back or seek an in-person evaluation if the symptoms worsen or if the condition fails to improve as anticipated.  Evangeline Dakin, DNP  [facility] [address], [place], [place] [address] [phone]

## 2021-11-24 ENCOUNTER — Other Ambulatory Visit: Payer: Self-pay

## 2021-11-24 ENCOUNTER — Encounter (HOSPITAL_BASED_OUTPATIENT_CLINIC_OR_DEPARTMENT_OTHER): Payer: Medicare Other | Attending: Internal Medicine | Admitting: Internal Medicine

## 2021-11-24 ENCOUNTER — Telehealth: Payer: Self-pay | Admitting: Neurology

## 2021-11-24 DIAGNOSIS — G8250 Quadriplegia, unspecified: Secondary | ICD-10-CM | POA: Insufficient documentation

## 2021-11-24 DIAGNOSIS — L89159 Pressure ulcer of sacral region, unspecified stage: Secondary | ICD-10-CM | POA: Diagnosis present

## 2021-11-24 NOTE — Progress Notes (Signed)
DELMAR DONDERO ([id number]) , Visit Report for 11/24/2021 Abuse/Suicide Risk Screen Details Patient Name: Date of Service: Veronica Huff, Veronica NA NDA R. 11/24/2021 1:15 PM Medical Record Number: [id number] Patient Account Number: [id number] Date of Birth/Sex: Treating RN: 30-Jul-1986 (36 y.o. Female) Zenaida Deed Primary Care Ronie Fleeger: Annita Brod Other Clinician: Referring Charlotte Fidalgo: Treating Mercadez Heitman/Extender: Manuella Ghazi in Treatment: 0 Abuse/Suicide Risk Screen Items Answer ABUSE RISK SCREEN: Has anyone close to you tried to hurt or harm you recentlyo No Do you feel uncomfortable with anyone in your familyo No Has anyone forced you do things that you didnt want to doo No Electronic Signature(s) Signed: 11/24/2021 5:42:44 PM By: Zenaida Deed RN, BSN Entered By: Zenaida Deed on 11/24/2021 13:31:35 -------------------------------------------------------------------------------- Activities of Daily Living Details Patient Name: Date of Service: Veronica Huff, Veronica NA NDA R. 11/24/2021 1:15 PM Medical Record Number: [id number] Patient Account Number: [id number] Date of Birth/Sex: Treating RN: 1985/12/11 (36 y.o. Female) Zenaida Deed Primary Care Larin Weissberg: Annita Brod Other Clinician: Referring Keaundra Stehle: Treating Amire Leazer/Extender: Manuella Ghazi in Treatment: 0 Activities of Daily Living Items Answer Activities of Daily Living (Please select one for each item) Drive Automobile Not Able T Medications ake Need Assistance Use T elephone Completely Able Care for Appearance Need Assistance Use T oilet Need Assistance Bath / Shower Need Assistance Dress Self Need Assistance Feed Self Need Assistance Walk Not Able Get In / Out Bed Not Able Housework Need Assistance Prepare Meals Need Assistance Handle Money Completely Able Shop for Self Need Assistance Electronic Signature(s) Signed: 11/24/2021 5:42:44 PM By: Zenaida Deed RN,  BSN Entered By: Zenaida Deed on 11/24/2021 13:32:12 -------------------------------------------------------------------------------- Education Screening Details Patient Name: Date of Service: Veronica Huff NA NDA R. 11/24/2021 1:15 PM Medical Record Number: [id number] Patient Account Number: [id number] Date of Birth/Sex: Treating RN: Sep 21, 1986 (35 y.o. Female) Zenaida Deed Primary Care Maribell Demeo: Annita Brod Other Clinician: Referring Kalleigh Harbor: Treating Annora Guderian/Extender: Manuella Ghazi in Treatment: 0 Primary Learner Assessed: Patient Learning Preferences/Education Level/Primary Language Learning Preference: Explanation, Demonstration, Printed Material Highest Education Level: College or Above Preferred Language: English Cognitive Barrier Language Barrier: No Translator Needed: No Memory Deficit: No Emotional Barrier: No Cultural/Religious Beliefs Affecting Medical Care: No Physical Barrier Impaired Vision: Yes Glasses Impaired Hearing: No Decreased Hand dexterity: Yes Limitations: complete quad Knowledge/Comprehension Knowledge Level: High Comprehension Level: High Ability to understand written instructions: High Ability to understand verbal instructions: High Motivation Anxiety Level: Calm Cooperation: Cooperative Education Importance: Acknowledges Need Interest in Health Problems: Asks Questions Perception: Coherent Willingness to Engage in Self-Management High Activities: Readiness to Engage in Self-Management High Activities: Electronic Signature(s) Signed: 11/24/2021 5:42:44 PM By: Zenaida Deed RN, BSN Entered By: Zenaida Deed on 11/24/2021 13:33:17 -------------------------------------------------------------------------------- Fall Risk Assessment Details Patient Name: Date of Service: Veronica Huff, Veronica NA NDA R. 11/24/2021 1:15 PM Medical Record Number: [id number] Patient Account Number: [id number] Date of Birth/Sex: Treating  RN: [birth year] (35 y.o. Female) Zenaida Deed Primary Care Sallie Staron: Annita Brod Other Clinician: Referring Mairyn Lenahan: Treating Kobie Whidby/Extender: Manuella Ghazi in Treatment: 0 Fall Risk Assessment Items Have you had 2 or more falls in the last 12 monthso 0 No Have you had any fall that resulted in injury in the last 12 monthso 0 No FALLS RISK SCREEN History of falling - immediate or within 3 months 0 No Secondary diagnosis (Do you have 2 or more medical diagnoseso) 15 Yes Ambulatory aid None/bed rest/wheelchair/nurse 0 Yes Crutches/cane/walker 0 No Furniture 0 No Intravenous therapy Access/Saline/Heparin Lock 0 No Gait/Transferring  Normal/ bed rest/ wheelchair 0 Yes Weak (short steps with or without shuffle, stooped but able to lift head while walking, may seek 0 No support from furniture) Impaired (short steps with shuffle, may have difficulty arising from chair, head down, impaired 0 No balance) Mental Status Oriented to own ability 0 Yes Electronic Signature(s) Signed: 11/24/2021 5:42:44 PM By: Zenaida Deed RN, BSN Entered By: Zenaida Deed on 11/24/2021 13:33:44 -------------------------------------------------------------------------------- Foot Assessment Details Patient Name: Date of Service: Veronica Huff NA NDA R. 11/24/2021 1:15 PM Medical Record Number: [id number] Patient Account Number: [id number] Date of Birth/Sex: Treating RN: 10/19/1986 (35 y.o. Female) Zenaida Deed Primary Care Ivon Oelkers: Annita Brod Other Clinician: Referring Yumiko Alkins: Treating Lylah Lantis/Extender: Manuella Ghazi in Treatment: 0 Foot Assessment Items Site Locations + = Sensation present, - = Sensation absent, C = Callus, U = Ulcer R = Redness, W = Warmth, M = Maceration, PU = Pre-ulcerative lesion F = Fissure, S = Swelling, D = Dryness Assessment Right: Left: Other Deformity: No No Prior Foot Ulcer: No No Prior Amputation: No  No Charcot Joint: No No Ambulatory Status: Non-ambulatory Assistance Device: Wheelchair Gait: Electronic Signature(s) Signed: 11/24/2021 5:42:44 PM By: Zenaida Deed RN, BSN Entered By: Zenaida Deed on 11/24/2021 13:35:05 -------------------------------------------------------------------------------- Nutrition Risk Screening Details Patient Name: Date of Service: Veronica Huff, Veronica NA NDA R. 11/24/2021 1:15 PM Medical Record Number: [id number] Patient Account Number: [id number] Date of Birth/Sex: Treating RN: 1986/10/21 (35 y.o. Female) Zenaida Deed Primary Care Rhina Kramme: Annita Brod Other Clinician: Referring Raju Coppolino: Treating Josette Shimabukuro/Extender: Manuella Ghazi in Treatment: 0 Height (in): Weight (lbs): Body Mass Index (BMI): Nutrition Risk Screening Items Score Screening NUTRITION RISK SCREEN: I have an illness or condition that made me change the kind and/or amount of food I eat 2 Yes I eat fewer than two meals per day 0 No I eat few fruits and vegetables, or milk products 0 No I have three or more drinks of beer, liquor or wine almost every day 0 No I have tooth or mouth problems that make it hard for me to eat 2 Yes I don't always have enough money to buy the food I need 0 No I eat alone most of the time 0 No I take three or more different prescribed or over-the-counter drugs Veronica day 1 Yes Without wanting to, I have lost or gained 10 pounds in the last six months 0 No I am not always physically able to shop, cook and/or feed myself 0 No Nutrition Protocols Good Risk Protocol Moderate Risk Protocol 0 Provide education on nutrition High Risk Proctocol Risk Level: Moderate Risk Score: 5 Electronic Signature(s) Signed: 11/24/2021 5:42:44 PM By: Zenaida Deed RN, BSN Entered By: Zenaida Deed on 11/24/2021 13:34:43

## 2021-11-24 NOTE — Telephone Encounter (Signed)
Patient had a video visit with Maralyn Sago, NP on 11/22/21 and expressed interest in restarting Xeomin injections with Dr. Terrace Arabia. Patient currently has Medicare as primary, no auth is required for injections. Sarah's note states that patient would prefer to schedule around May. I called the patient and LVM requesting CB to schedule.

## 2021-11-24 NOTE — Progress Notes (Signed)
Veronica NormaBENNETT Vear R. ([id number]) , Visit Report for 11/24/2021 Chief Complaint Document Details Patient Name: Date of Service: Veronica Huff, A NA NDA R. 11/24/2021 1:15 PM Medical Record Number: [id number] Patient Account Number: [id number] Date of Birth/Sex: Treating RN: 1986/01/02 (36 y.o. Female) [name], [name] Primary Care Provider: Annita BrodAsenso, Philip Other Clinician: Referring Provider: Treating Provider/Extender: Manuella Ghaziobson, Sundi Slevin Asenso, Philip Weeks in Treatment: 0 Information Obtained from: Patient Chief Complaint 11/24/2021 patient comes in today out of concern for an area on her lower sacrum/coccyx Electronic Signature(s) Signed: 11/24/2021 4:38:12 PM By: Baltazar Najjarobson, Jaymes Hang MD Entered By: Baltazar Najjarobson, Jackline Castilla on 11/24/2021 14:11:00 -------------------------------------------------------------------------------- HPI Details Patient Name: Date of Service: Veronica Huff, A NA NDA R. 11/24/2021 1:15 PM Medical Record Number: [id number] Patient Account Number: [id number] Date of Birth/Sex: Treating RN: 1986/01/02 (35 y.o. 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 In spite of this marked disability currently she lives on her own with her boyfriend she has around-the-clock nurses coming into her home and she is meticulous about the care of her skin. Over the last year on and off she has had an area in the lower sacrum/coccyx that will open and then close but the cycle seems to repeat. She uses DuoDERM as an offloading foam sometimes Betadine. She comes in today and the area is really closed although there are some skin tags in this area may be too close together. The patient is wheelchair-bound of course she has  a Roho cushion and tilts her self rigorously to offload various areas of her skin. 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 11/24/2021 1:15 PM Medical Record Number: [id number] Patient Account Number: [id number] Date of Birth/Sex: Treating RN: 1986/01/02 (35 y.o. Female) [name], [name] Primary Care Provider: Annita BrodAsenso, Philip Other Clinician: Referring Provider: Treating Provider/Extender: Manuella Ghaziobson, Jernard Reiber Asenso, Philip Weeks in Treatment: 0 Constitutional Patient is hypotensive.However appears well. Pulse regular and within target range for patient.Marland Kitchen. Respirations regular, non-labored and within target range.. Temperature is normal and within the target range for the patient.Marland Kitchen. Appears in no distress. Integumentary (Hair, Skin) Skin and subcutaneous tissue without rashes, lesions. Psychiatric appears at normal baseline. Notes wound exam; there is no open wound in the lower sacrum/coccyx. There are a series of what look like to be small skin tags obviously trunk trauma of these areas by friction or pressure may result in recurrent wounds. There is nothing open that I can see Electronic Signature(s) Signed: 11/24/2021 4:38:12 PM By: Baltazar Najjarobson, Kaeleb Emond MD Entered By: Baltazar Najjarobson, Stormie Ventola on 11/24/2021 14:17:08 -------------------------------------------------------------------------------- Physician Orders Details Patient Name: Date of Service: Veronica Huff, A NA NDA R. 11/24/2021 1:15 PM Medical Record Number: [id number] Patient Account Number: [id number] Date  of Birth/Sex: Treating RN: 1986/01/02 (35 y.o. Female) Zenaida DeedBoehlein, Linda Primary Care Provider: Annita BrodAsenso, Philip Other Clinician: Referring Provider: Treating Provider/Extender: Manuella Ghaziobson, Dennisse Swader Asenso, Philip Weeks in Treatment: 0 Verbal / Phone Orders: No Diagnosis [facility] Off-Loading Turn and reposition every 2 hours Non Wound Condition  Protect area with: - sacral area with hydrocolloid, change every 3 days or as needed Electronic Signature(s) Signed: 11/24/2021 4:38:12 PM By: Baltazar Najjar MD Signed: 11/24/2021 5:42:44 PM By: Zenaida Deed RN, BSN Entered By: Zenaida Deed on 11/24/2021 13:57:07 -------------------------------------------------------------------------------- Problem List Details Patient Name: Date of Service: Veronica Rogue NA NDA R. 11/24/2021 1:15 PM Medical Record Number: [id number] Patient Account Number: [id number] Date of Birth/Sex: Treating RN: 09/08/1986 (35 y.o. Female) Zandra Abts Primary Care Provider: Annita Brod Other Clinician: Referring Provider: Treating Provider/Extender: Manuella Ghazi in Treatment: 0 Active Problems ICD-10 Encounter Code Description Active Date MDM Diagnosis L89.159 Pressure ulcer of sacral region, unspecified stage 11/24/2021 No Yes Inactive Problems Resolved Problems Electronic Signature(s) Signed: 11/24/2021 4:38:12 PM By: Baltazar Najjar MD Entered By: Baltazar Najjar on 11/24/2021 14:10:25 -------------------------------------------------------------------------------- Progress Note Details Patient Name: Date of Service: Veronica Rogue NA NDA R. 11/24/2021 1:15 PM Medical Record Number: [id number] Patient Account Number: [id number] Date of Birth/Sex: Treating RN: 1986-02-24 (35 y.o. 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 In spite of this marked disability currently she lives on her own with her boyfriend she has around-the-clock nurses coming into her home and she is meticulous about the care of her skin. Over the last year on and off she has had an area in the lower sacrum/coccyx that will open and then close but the cycle seems to repeat. She uses DuoDERM as an offloading foam sometimes Betadine. She comes in today and the area is really closed although there are some skin tags in this area may be too close together. The patient is wheelchair-bound of course she has a Roho cushion and tilts her self rigorously to offload various areas of her skin. 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 Allergies amoxicillin (Severity: Moderate, Reaction: hives), ciprofloxacin (Reaction: hives), tobramycin (Reaction: hives), trazodone (Reaction: eyes shake), nefazodone, morphine (Reaction: go crazy), Vantin (Reaction: palpitations) Family History Diabetes - Mother,Paternal Grandparents, Heart Disease - Maternal Grandparents,Paternal Grandparents, Hypertension - Mother, Lung Disease - Mother, Stroke - Mother, No family history of Cancer, Hereditary Spherocytosis, Kidney Disease, Seizures, Thyroid Problems, Tuberculosis. Social History Never smoker, Marital Status - Single, Alcohol Use - Never, Drug Use - No History, Caffeine Use - Moderate. Medical  History Eyes Denies history of Cataracts, Glaucoma, Optic Neuritis Ear/Nose/Mouth/Throat Patient has history of Chronic sinus problems/congestion - seasonal allergies Denies history of Middle ear problems Endocrine Denies history of Type I Diabetes, Type II Diabetes Genitourinary Denies history of End Stage Renal Disease Integumentary (Skin) Denies history of History of Burn Neurologic Patient has history of Quadriplegia Psychiatric Denies history of Anorexia/bulimia, Confinement Anxiety Hospitalization/Surgery History - spine surgery. - tracheostomy. - g-tube placement. - wisdom tooth extraction. - throat reconstruction. - cholecystectomy. - wrist surgery. Medical A Surgical History Notes nd Ear/Nose/Mouth/Throat trach Respiratory trach Gastrointestinal bowel program Genitourinary neurogenic bladder Neurologic brain stem CVA age 67 Review of Systems (ROS) Constitutional Symptoms (General Health) Denies complaints or symptoms of  Fatigue, Fever, Chills, Marked Weight Change. Eyes Complains or has symptoms of Glasses / Contacts. Denies complaints or symptoms of Dry Eyes, Vision Changes. Ear/Nose/Mouth/Throat Denies complaints or symptoms of Chronic sinus problems or rhinitis. Cardiovascular Denies complaints or symptoms of Chest pain. Gastrointestinal Denies complaints or symptoms of Frequent diarrhea, Nausea, Vomiting. Endocrine Denies complaints or symptoms of Heat/cold intolerance. Genitourinary Denies complaints or symptoms of Frequent urination. Integumentary (Skin) Denies complaints or symptoms of Wounds. Psychiatric Denies complaints or symptoms of Claustrophobia, Suicidal. Objective Constitutional Patient is hypotensive.However appears well. Pulse regular and within target range for patient.Marland Kitchen Respirations regular, non-labored and within target range.. Temperature is normal and within the target range for the patient.Marland Kitchen Appears in no distress. Vitals Time  Taken: 1:06 PM, Temperature: 98.3 F, Pulse: 88 bpm, Respiratory Rate: 20 breaths/min, Blood Pressure: 98/64 mmHg. Psychiatric appears at normal baseline. General Notes: wound exam; there is no open wound in the lower sacrum/coccyx. There are a series of what look like to be small skin tags obviously trunk trauma of these areas by friction or pressure may result in recurrent wounds. There is nothing open that I can see Integumentary (Hair, Skin) Skin and subcutaneous tissue without rashes, lesions. Assessment Active Problems ICD-10 Pressure ulcer of sacral region, unspecified stage Plan Discharge From [facility] Services: Discharge from Wound Care Center Off-Loading: Turn and reposition every 2 hours Non Wound Condition: Protect area with: - sacral area with hydrocolloid, change every 3 days or as needed The patient does not have an open wound oo Hence does not need to be followed in our clinic oo The skin tags themselves are not open however recurrent trauma to these would certainly or conceivably result in recurrent wounds. Covering him with DuoDERM would seem to be reasonable on an ongoing basis I do not think that these should be removed as we would create more of a problem than there is oo My review of her is really noteworthy for the fact that her skin looks absolutely amazing considering her disability. She tells me she has had previous wounds on her left hip and one of her heels but certainly on her buttocks there is no pressure areas visible at all I spent 35 minutes in review that this patient's past medical history, face-to-face evaluation and preparation of this record Electronic Signature(s) Signed: 11/24/2021 4:38:12 PM By: Baltazar Najjar MD Entered By: Baltazar Najjar on 11/24/2021 14:19:49 -------------------------------------------------------------------------------- HxROS Details Patient Name: Date of Service: Veronica Rogue NA NDA R. 11/24/2021 1:15 PM Medical Record  Number: [id number] Patient Account Number: [id number] Date of Birth/Sex: Treating RN: 01/17/1986 (35 y.o. Female) Zenaida Deed Primary Care Provider: Annita Brod Other Clinician: Referring Provider: Treating Provider/Extender: Manuella Ghazi in Treatment: 0 Information Obtained From Patient Caregiver Chart Constitutional Symptoms (General Health) Complaints and Symptoms: Negative for: Fatigue; Fever; Chills; Marked Weight Change Eyes Complaints and Symptoms: Positive for: Glasses / Contacts Negative for: Dry Eyes; Vision Changes Medical History: Negative for: Cataracts; Glaucoma; Optic Neuritis Ear/Nose/Mouth/Throat Complaints and Symptoms: Negative for: Chronic sinus problems or rhinitis Medical History: Positive for: Chronic sinus problems/congestion - seasonal allergies Negative for: Middle ear problems Past Medical History Notes: trach Cardiovascular Complaints and Symptoms: Negative for: Chest pain Gastrointestinal Complaints and Symptoms: Negative for: Frequent diarrhea; Nausea; Vomiting Medical History: Past Medical History Notes: bowel program Endocrine Complaints and Symptoms: Negative for: Heat/cold intolerance Medical History: Negative for: Type I Diabetes; Type II Diabetes Genitourinary Complaints and Symptoms: Negative for: Frequent urination Medical History: Negative for: End Stage Renal  Disease Past Medical History Notes: neurogenic bladder Integumentary (Skin) Complaints and Symptoms: Negative for: Wounds Medical History: Negative for: History of Burn Psychiatric Complaints and Symptoms: Negative for: Claustrophobia; Suicidal Medical History: Negative for: Anorexia/bulimia; Confinement Anxiety Hematologic/Lymphatic Respiratory Medical History: Past Medical History Notes: trach Immunological Musculoskeletal Neurologic Medical History: Positive for: Quadriplegia Past Medical History Notes: brain stem CVA  age 36 Oncologic HBO Extended History Items Ear/Nose/Mouth/Throat: Chronic sinus problems/congestion Immunizations Pneumococcal Vaccine: Received Pneumococcal Vaccination: No Implantable Devices No devices added Hospitalization / Surgery History Type of Hospitalization/Surgery spine surgery tracheostomy g-tube placement wisdom tooth extraction throat reconstruction cholecystectomy wrist surgery Family and Social History Cancer: No; Diabetes: Yes - Mother,Paternal Grandparents; Heart Disease: Yes - Maternal Grandparents,Paternal Grandparents; Hereditary Spherocytosis: No; Hypertension: Yes - Mother; Kidney Disease: No; Lung Disease: Yes - Mother; Seizures: No; Stroke: Yes - Mother; Thyroid Problems: No; Tuberculosis: No; Never smoker; Marital Status - Single; Alcohol Use: Never; Drug Use: No History; Caffeine Use: Moderate; Financial Concerns: No; Food, Clothing or Shelter Needs: No; Support System Lacking: No; Transportation Concerns: Yes - uses [employer] Electronic Signature(s) Signed: 11/24/2021 4:38:12 PM By: Baltazar Najjarobson, Adyn Serna MD Signed: 11/24/2021 5:42:44 PM By: Zenaida DeedBoehlein, Linda RN, BSN Entered By: Zenaida DeedBoehlein, Linda on 11/24/2021 14:05:23 -------------------------------------------------------------------------------- SuperBill Details Patient Name: Date of Service: Veronica Huff, A NA NDA R. 11/24/2021 Medical Record Number: [id number] Patient Account Number: [id number] Date of Birth/Sex: Treating RN: 1986-03-16 (35 y.o. Female) Zenaida DeedBoehlein, Linda Primary Care Provider: Annita BrodAsenso, Philip Other Clinician: Referring Provider: Treating Provider/Extender: Manuella Ghaziobson, Samiel Peel Asenso, Philip Weeks in Treatment: 0 Diagnosis Coding ICD-10 Codes Code Description L89.159 Pressure ulcer of sacral region, unspecified stage Facility Procedures CPT4 Code: [id number] Description: 99213 - WOUND CARE VISIT-LEV 3 EST PT Modifier: Quantity: 1 Physician Procedures : CPT4 Code Description Modifier [id number] WC PHYS  LEVEL 3 NEW PT ICD-10 Diagnosis Description L89.159 Pressure ulcer of sacral region, unspecified stage Quantity: 1 Electronic Signature(s) Signed: 11/24/2021 4:38:12 PM By: Baltazar Najjarobson, Ezra Marquess MD Entered By: Baltazar Najjarobson, Apple Dearmas on 11/24/2021 14:20:14

## 2021-11-24 NOTE — Progress Notes (Signed)
Veronica NormaBENNETT Elner R. ([id number]) , Visit Report for 11/24/2021 Allergy List Details Patient Name: Date of Service: Veronica Huff, Veronica NA NDA R. 11/24/2021 1:15 PM Medical Record Number: [id number] Patient Account Number: [id number] Date of Birth/Sex: Treating RN: [birth year] (36 y.o. Female) Zenaida DeedBoehlein, Linda Primary Care Kennieth Plotts: Annita BrodAsenso, Philip Other Clinician: Referring Arlander Gillen: Treating Jessice Madill/Extender: Manuella Ghaziobson, Michael Asenso, Philip Weeks in Treatment: 0 Allergies Active Allergies amoxicillin Reaction: hives Severity: Moderate ciprofloxacin Reaction: hives tobramycin Reaction: hives trazodone Reaction: eyes shake nefazodone morphine Reaction: go crazy Vantin Reaction: palpitations Allergy Notes Electronic Signature(s) Signed: 11/24/2021 5:42:44 PM By: Zenaida DeedBoehlein, Linda RN, BSN Entered By: Zenaida DeedBoehlein, Linda on 11/24/2021 13:23:21 -------------------------------------------------------------------------------- Arrival Information Details Patient Name: Date of Service: Veronica Huff, Veronica NA NDA R. 11/24/2021 1:15 PM Medical Record Number: [id number] Patient Account Number: [id number] Date of Birth/Sex: Treating RN: [birth year] (35 y.o. Female) Zenaida DeedBoehlein, Linda Primary Care Rossetta Kama: Annita BrodAsenso, Philip Other Clinician: Referring Gwenneth Whiteman: Treating Ragina Fenter/Extender: Manuella Ghaziobson, Michael Asenso, Philip Weeks in Treatment: 0 Visit Information Patient Arrived: Wheel Chair Arrival Time: 13:00 Accompanied By: caregiver Transfer Assistance: Michiel SitesHoyer Lift Patient Identification Verified: Yes Secondary Verification Process Completed: Yes Patient Requires Transmission-Based Precautions: No Patient Has Alerts: No Electronic Signature(s) Signed: 11/24/2021 5:42:44 PM By: Zenaida DeedBoehlein, Linda RN, BSN Entered By: Zenaida DeedBoehlein, Linda on 11/24/2021 13:06:36 -------------------------------------------------------------------------------- Clinic Level of Care Assessment Details Patient Name: Date of Service: Veronica Huff, Veronica NA NDA R.  11/24/2021 1:15 PM Medical Record Number: [id number] Patient Account Number: [id number] Date of Birth/Sex: Treating RN: [birth year] (35 y.o. Female) Zenaida DeedBoehlein, Linda Primary Care Ollie Esty: Annita BrodAsenso, Philip Other Clinician: Referring Jshon Ibe: Treating Tristyn Pharris/Extender: Manuella Ghaziobson, Michael Asenso, Philip Weeks in Treatment: 0 Clinic Level of Care Assessment Items TOOL 2 Quantity Score []  - 0 Use when only an EandM is performed on the INITIAL visit ASSESSMENTS - Nursing Assessment / Reassessment X- 1 20 General Physical Exam (combine w/ comprehensive assessment (listed just below) when performed on new pt. evals) X- 1 25 Comprehensive Assessment (HX, ROS, Risk Assessments, Wounds Hx, etc.) ASSESSMENTS - Wound and Skin Veronica ssessment / Reassessment X - Simple Wound Assessment / Reassessment - one wound 1 5 []  - 0 Complex Wound Assessment / Reassessment - multiple wounds []  - 0 Dermatologic / Skin Assessment (not related to wound area) ASSESSMENTS - Ostomy and/or Continence Assessment and Care []  - 0 Incontinence Assessment and Management []  - 0 Ostomy Care Assessment and Management (repouching, etc.) PROCESS - Coordination of Care X - Simple Patient / Family Education for ongoing care 1 15 []  - 0 Complex (extensive) Patient / Family Education for ongoing care X- 1 10 Staff obtains ChiropractorConsents, Records, T Results / Process Orders est []  - 0 Staff telephones HHA, Nursing Homes / Clarify orders / etc []  - 0 Routine Transfer to another Facility (non-emergent condition) []  - 0 Routine Hospital Admission (non-emergent condition) []  - 0 New Admissions / Manufacturing engineernsurance Authorizations / Ordering NPWT Apligraf, etc. , []  - 0 Emergency Hospital Admission (emergent condition) X- 1 10 Simple Discharge Coordination []  - 0 Complex (extensive) Discharge Coordination PROCESS - Special Needs []  - 0 Pediatric / Minor Patient Management []  - 0 Isolation Patient Management []  - 0 Hearing / Language /  Visual special needs []  - 0 Assessment of Community assistance (transportation, D/C planning, etc.) []  - 0 Additional assistance / Altered mentation []  - 0 Support Surface(s) Assessment (bed, cushion, seat, etc.) INTERVENTIONS - Wound Cleansing / Measurement []  - 0 Wound Imaging (photographs - any number of wounds) []  - 0 Wound Tracing (instead of photographs) []  - 0 Simple  Wound Measurement - one wound []  - 0 Complex Wound Measurement - multiple wounds []  - 0 Simple Wound Cleansing - one wound []  - 0 Complex Wound Cleansing - multiple wounds INTERVENTIONS - Wound Dressings []  - 0 Small Wound Dressing one or multiple wounds []  - 0 Medium Wound Dressing one or multiple wounds []  - 0 Large Wound Dressing one or multiple wounds []  - 0 Application of Medications - injection INTERVENTIONS - Miscellaneous []  - 0 External ear exam []  - 0 Specimen Collection (cultures, biopsies, blood, body fluids, etc.) []  - 0 Specimen(s) / Culture(s) sent or taken to Lab for analysis X- 1 10 Patient Transfer (multiple staff / Lift / Similar devices) []  - 0 Simple Staple / Suture removal (25 or less) []  - 0 Complex Staple / Suture removal (26 or more) []  - 0 Hypo / Hyperglycemic Management (close monitor of Blood Glucose) []  - 0 Ankle / Brachial Index (ABI) - do not check if billed separately Has the patient been seen at the hospital within the last three years: Yes Total Score: 95 Level Of Care: New/Established - Level 3 Electronic Signature(s) Signed: 11/24/2021 5:42:44 PM By: RN, BSN Entered By: on 11/24/2021 13:52:41 -------------------------------------------------------------------------------- Encounter Discharge Information Details Patient Name: Date of Service: NA NDA R. 11/24/2021 1:15 PM Medical Record Number: Patient Account Number: Date of Birth/Sex: Treating RN: 09-04-1986 (35 y.o. Female) Michiel Sites Primary Care Edilberto Roosevelt: Other Clinician: Referring Delmon Andrada: Treating Machael Raine/Extender: in Treatment: 0 Encounter Discharge Information Items Discharge Condition: Stable Ambulatory Status: Wheelchair Discharge Destination: Home Transportation: Other Accompanied By: caregiver Schedule Follow-up Appointment: Yes Clinical Summary of Care: Patient Declined Notes SCAT Electronic Signature(s) Signed: 11/24/2021 5:42:44 PM By: RN, BSN Signed: 11/24/2021 5:42:44 PM By: Zenaida Deed RN, BSN Entered By: Zenaida Deed on 11/24/2021 14:06:43 -------------------------------------------------------------------------------- Lower Extremity Assessment Details Patient Name: Date of Service: Veronica Huff NA NDA R. 11/24/2021 1:15 PM Medical Record Number: [id number] Patient Account Number: [id number] Date of Birth/Sex: Treating RN: [birth year] (35 y.o. Female) Zenaida Deed Primary Care Dylanie Quesenberry: Annita Brod Other Clinician: Referring Candie Gintz: Treating Aubryana Vittorio/Extender: Manuella Ghazi in Treatment: 0 Electronic Signature(s) Signed: 11/24/2021 5:42:44 PM By: Zenaida Deed RN, BSN Entered By: 01/22/2022 on 11/24/2021 13:35:13 -------------------------------------------------------------------------------- Pain Assessment Details Patient Name: Date of Service: Zenaida Deed NA NDA R. 11/24/2021 1:15 PM Medical Record Number: Veronica Huff Patient Account Number: 01/22/2022 Date of Birth/Sex: Treating RN: [birth year] (35 y.o. Female) [id number] Primary Care Artavius Stearns: 07/01/1986 Other Clinician: Referring Carleta Woodrow: Treating Marin Milley/Extender: Zenaida Deed in Treatment: 0 Active Problems Location of Pain Severity and Description of Pain Patient Has Paino No Site Locations Rate the pain. Current Pain Level: 0 Pain Management and Medication Current Pain  Management: Electronic Signature(s) Signed: 11/24/2021 5:42:44 PM By: Manuella Ghazi RN, BSN Entered By: 01/22/2022 on 11/24/2021 13:40:21 -------------------------------------------------------------------------------- Patient/Caregiver Education Details Patient Name: Date of Service: Zenaida Deed NA NDA R. 1/5/2023andnbsp1:15 PM Medical Record Number: Veronica Huff Patient Account Number: 01/22/2022 Date of Birth/Gender: Treating RN: 1986-06-27 (36 y.o. Female) 07/01/1986 Primary Care Physician: Zenaida Deed Other Clinician: Referring Physician: Treating Physician/Extender: Annita Brod in Treatment: 0 Education Assessment Education Provided To: Patient Education Topics Provided Pressure: Methods: Explain/Verbal Responses: Reinforcements needed, State content correctly Wound/Skin Impairment: Methods: Explain/Verbal Responses: Reinforcements needed, State content correctly Electronic Signature(s) Signed: 11/24/2021 5:42:44 PM By: 01/22/2022 RN, BSN Entered By: Zenaida Deed  on 11/24/2021 13:51:03 -------------------------------------------------------------------------------- Vitals Details Patient Name: Date of Service: Veronica Huff, Veronica Huff NA NDA R. 11/24/2021 1:15 PM Medical Record Number: [id number] Patient Account Number: [id number] Date of Birth/Sex: Treating RN: Sep 10, 1986 (36 y.o. Female) Zenaida Deed Primary Care Riker Collier: Annita Brod Other Clinician: Referring Kycen Spalla: Treating Delissa Silba/Extender: Manuella Ghazi in Treatment: 0 Vital Signs Time Taken: 13:06 Temperature (F): 98.3 Pulse (bpm): 88 Respiratory Rate (breaths/min): 20 Blood Pressure (mmHg): 98/64 Reference Range: 80 - 120 mg / dl Electronic Signature(s) Signed: 11/24/2021 5:42:44 PM By: Zenaida Deed RN, BSN Entered By: Zenaida Deed on 11/24/2021 13:07:08

## 2022-01-02 ENCOUNTER — Ambulatory Visit: Payer: Medicare Other | Admitting: Physical Therapy

## 2022-01-09 ENCOUNTER — Encounter: Payer: Self-pay | Admitting: Internal Medicine

## 2022-01-09 ENCOUNTER — Other Ambulatory Visit (INDEPENDENT_AMBULATORY_CARE_PROVIDER_SITE_OTHER): Payer: Medicare Other

## 2022-01-09 ENCOUNTER — Ambulatory Visit (INDEPENDENT_AMBULATORY_CARE_PROVIDER_SITE_OTHER): Payer: Medicare Other | Admitting: Internal Medicine

## 2022-01-09 VITALS — BP 100/60 | HR 95 | Ht 62.0 in

## 2022-01-09 DIAGNOSIS — R109 Unspecified abdominal pain: Secondary | ICD-10-CM

## 2022-01-09 DIAGNOSIS — K5900 Constipation, unspecified: Secondary | ICD-10-CM

## 2022-01-09 LAB — TSH: TSH: 3.46 u[IU]/mL (ref 0.35–5.50)

## 2022-01-09 LAB — T4, FREE: Free T4: 0.87 ng/dL (ref 0.60–1.60)

## 2022-01-09 NOTE — Patient Instructions (Signed)
Your provider has requested that you go to the basement level for lab work before leaving today. Press "B" on the elevator. The lab is located at the first door on the left as you exit the elevator.   Please purchase the following medications over the counter and take as directed: Fiber supplement such as Benefiber- use as directed daily  The [facility] providers would like to encourage you to use [employer] to communicate with providers for non-urgent requests or questions.  Due to long hold times on the telephone, sending your provider a message by [facility] may be a faster and more efficient way to get a response.  Please allow 48 business hours for a response.  Please remember that this is for non-urgent requests.   Due to recent changes in healthcare laws, you may see the results of your imaging and laboratory studies on MyChart before your provider has had a chance to review them.  We understand that in some cases there may be results that are confusing or concerning to you. Not all laboratory results come back in the same time frame and the provider may be waiting for multiple results in order to interpret others.  Please give [place] 48 hours in order for your provider to thoroughly review all the results before contacting the office for clarification of your results.

## 2022-01-09 NOTE — Progress Notes (Signed)
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 She usually goes once a week, which usually results in an extra large BM. She does a suppository every other night. She has a [employer] fast streamer that seems to help her have BM by squeezing her abdomen. When she does get constipated, it tends to hurt in the same spot in the top left upper quadrant. Her pain gets better after having a BM. Endorses bloating. She takes Miralax QOD because if she takes it more frequently, it will cause her to have too much diarrhea.  She does also take Colace and senna.  She does try to roll every 2-4 hours when she is laying down. She used to use an abdominal binder, which sometimes causes her to have a BM, but stopped the binder after she started developing skin breakdown. Denies blood in stools or weight loss. She has issues with cold intolerance and fatigue recently. She is on a ventilator at night time. he has chronic nausea for years for which she takes Carafate, which seems to help. She had an EGD with Dr. Amedeo Plenty in the past; she did not have any ulcer at that time. She does have some occasional issues with dysphagia. Denies prior colonoscopy. Denies fam hx of GI cancer. Mother has IBS.    Past Medical History:  Diagnosis Date   Anxiety    CVA (cerebral infarction)    Ischemic brainstem stroke in 2011 at age 32 due to unknown reasons--quadriplegic after stroke.   Depression    Zoloft has helped-not having any current depression   Edema extremities    Feet and legs-mostly in feet   Headache(784.0)    Prior to imipramine-none currently   Pneumonia    Quadriplegia ([place])    Secondary to stroke at age 51-has trach-lives with boyfriend-motorized w/c-joystick to chin to drive w/c--I & O cath thru  suprapubic stoma - has nursing care 16 hrs daily and boyfriend able to help pt the other hours of day.   Recurrent UTI (urinary tract infection)    Due to suprapubic stoma, repetitive catheterizations   Sleep apnea    On ventilator at night -has oxygen but rarely needs unless she has a cold and sats drop   Stroke [facility])      Past Surgical History:  Procedure Laterality Date   BLADDER SURGERY  2009,2012   BRONCHOSCOPY  2006   CHOLECYSTECTOMY  11/18/2012   Procedure: LAPAROSCOPIC CHOLECYSTECTOMY;  Surgeon: Ralene Ok, MD;  Location: [facility];  Service: General;  Laterality: N/A;   ESOPHAGOGASTRODUODENOSCOPY     GASTROSTOMY TUBE PLACEMENT  2000   ILEOSTOMY  2006   MICROLARYNGOSCOPY WITH LASER AND BALLOON DILATION N/A 09/27/2016   Procedure: MICROLARYNGOSCOPY WITH LASER AND BALLOON DILATION;  Surgeon: Melida Quitter, MD;  Location: [place];  Service: ENT;  Laterality: N/A;  micro direct laryngoscopy with co2 laser balloon dilation of trachea   REMOVAL OF GASTROSTOMY TUBE     SPINE SURGERY  2004,2011   TENDON REPAIR  2012   Left wrist   THROAT SURGERY  2004   TRACHEOSTOMY  2000   wirst surgery Left    WISDOM TOOTH EXTRACTION  2008   Family History  Problem Relation Age of Onset   Diabetes Mother    Hypertension Mother    Varicose Veins  Mother    Cancer Maternal Aunt        lung   Diabetes Maternal Grandfather    Heart disease Maternal Grandfather    Heart disease Paternal Grandfather    Other Sister        overdose   Social History   Tobacco Use   Smoking status: Never   Smokeless tobacco: Never  Vaping Use   Vaping Use: Never used  Substance Use Topics   Alcohol use: No    Alcohol/week: 0.0 standard drinks   Drug use: No    Types: Marijuana   Current Outpatient Medications  Medication Sig Dispense Refill   baclofen (LIORESAL) 20 MG tablet TAKE TWO TABLETS BY MOUTH FOUR TIMES A DAY 720 tablet 0   busPIRone (BUSPAR) 5 MG tablet Take 5 mg by mouth 3 (three) times  daily.     clotrimazole-betamethasone (LOTRISONE) cream Apply 1 application topically daily as needed (for irritation). On labium     dantrolene (DANTRIUM) 100 MG capsule TAKE 1 CAPSULE BY MOUTH 3 TIMES DAILY. 90 capsule 5   diphenhydrAMINE (BENADRYL) 25 MG tablet Take 1 tablet (25 mg total) by mouth every 6 (six) hours as needed for itching or allergies. Take with Levofloxacin to prevent allergic reaction 30 tablet 0   Docusate Sodium (DSS) 100 MG CAPS Take 100 mg by mouth in the morning, at noon, and at bedtime.     EPIPEN 2-PAK 0.3 MG/0.3ML SOAJ injection Inject 0.3 mg into the muscle daily as needed for anaphylaxis.  1   fluticasone (FLONASE) 50 MCG/ACT nasal spray INSERT 2 SPRAYS IN EACH NOSTRIL EVERY DAY FOR STUFFY NOSE OR DRAINAGE 16 g 5   gabapentin (NEURONTIN) 300 MG capsule Take 300 mg by mouth 3 (three) times daily.     hydrocortisone cream 0.5 % Apply 1 application topically 2 (two) times daily. To hairline and brows     ibuprofen (ADVIL) 200 MG tablet Take 400 mg by mouth every 6 (six) hours as needed for fever, headache, mild pain, moderate pain or cramping.     imipramine (TOFRANIL) 50 MG tablet TAKE 2 TABLETS (100 MG TOTAL) BY MOUTH AT BEDTIME. 60 tablet 11   ketorolac (ACULAR) 0.5 % ophthalmic solution Place 1 drop into both eyes daily.      lactulose (CHRONULAC) 10 GM/15ML solution Take 10 g by mouth daily as needed for mild constipation.     levocetirizine (XYZAL) 5 MG tablet TAKE 1 TABLET BY MOUTH EVERY DAY AS NEEDED 30 tablet 0   medroxyPROGESTERone (DEPO-PROVERA) 150 MG/ML injection Inject 1 mL (150 mg total) into the muscle every 3 (three) months. 1 mL 3   metoprolol succinate (TOPROL XL) 25 MG 24 hr tablet Take 1 tablet (25 mg total) by mouth in the morning and at bedtime. 180 tablet 3   Multiple Vitamin (MULTIVITAMIN WITH MINERALS) TABS tablet Take 1 tablet by mouth daily.     mupirocin ointment (BACTROBAN) 2 % 1 application by Tracheal Tube route daily. Applies to  trach-stoma site     nystatin (MYCOSTATIN) powder Apply 1 application topically 2 (two) times daily as needed (skin irritation). Small amount . Rash     polyethylene glycol powder (MIRALAX) 17 GM/SCOOP powder Start taking 1 capful 3 times a day. Slowly cut back as needed until you have normal bowel movements. 255 g 0   senna (SENOKOT) 8.6 MG tablet Take 1 tablet by mouth every other day.     solifenacin (VESICARE) 10 MG tablet Take 10  mg by mouth daily.     sucralfate (CARAFATE) 1 GM/10ML suspension Take 1 g by mouth 3 (three) times daily.      topiramate (TOPAMAX) 50 MG tablet Take 1 tablet (50 mg total) by mouth at bedtime. 90 tablet 3   No current facility-administered medications for this visit.   Allergies  Allergen Reactions   Bee Venom Shortness Of Breath and Swelling    SHOCK   Amoxicillin Hives and Itching    Tolerates Keflex, Rocephin Has patient had a PCN reaction causing immediate rash, facial/tongue/throat swelling, SOB or lightheadedness with hypotension: no Has patient had a PCN reaction causing severe rash involving mucus membranes or skin necrosis: no Has patient had a PCN reaction that required hospitalization: no Has patient had a PCN reaction occurring within the last 10 years: unknown If all of the above answers are "NO", then may proceed with Cephalosporin use.    Ciprofloxacin Hives and Rash   Tobramycin Hives and Rash   Erythromycin Other (See Comments)    UNSPECIFIED REACTION     Trazodone Other (See Comments)    UNSPECIFIED REACTION  Eyes shake   Trazodone And Nefazodone     UNSPECIFIED REACTION    Morphine And Related Other (See Comments)    "GOES CRAZY"    Vantin [Cefpodoxime] Palpitations     Review of Systems: All systems reviewed and negative except where noted in HPI.   Physical Exam: BP 100/60    Pulse 95    SpO2 100%  Constitutional: Pleasant,well-developed, female in a wheelchair HEENT: Normocephalic and atraumatic. Conjunctivae are  normal. No scleral icterus. Trach in place. Cardiovascular: Normal rate, regular rhythm.  Pulmonary/chest: Effort normal and breath sounds normal. Abdominal: Soft, nondistended, tender in the lower abdomen. Quiet bowel sounds Extremities: No edema Neurological: Alert and oriented. Quadriplegic. Skin: Skin is warm and dry. No rashes noted. Psychiatric: Normal mood and affect. Behavior is normal.  Labs 04/2021: CBC nml. BMP unremarkable.  CT A/P w/contrast 03/13/20: IMPRESSION: 1. Mildly prominent fluid in stool filled colon to the level of the rectum without definite evidence of bowel obstruction. This could be due to constipation. 2. Layering bladder calculi with diffuse mild bladder wall thickening which could be due to chronic cystitis.  Barium swallow 11/02/20: IMPRESSION: Negative esophagram.  ASSESSMENT AND PLAN: Constipation Abdominal pain Patient presents with longstanding constipation issues that have worsened over time.  She has also had a combination of left upper quadrant abdominal pain as well as lower abdominal pain that seems to improve after having a BM.  Her prior abdominal imaging has revealed prominent stool-filled colon. I suspect that her ab pain is primarily due to constipation and/or IBS. However due to a worsening of her constipation over time, discussed the risks and benefits of undergoing colonoscopy with the patient. Patient wishes to proceed with colonoscopy since she is worried that she may have a colon cancer that is causing her symptoms - Start fiber supplement - Check TSH, FT4. If normal, then will plan for colonoscopy [facility].  Christia Reading, MD  I spent 61 minutes of time, including in depth chart review, independent review of results as outlined above, communicating results with the patient directly, face-to-face time with the patient, coordinating care, ordering studies and medications as appropriate, and documentation.

## 2022-01-10 ENCOUNTER — Encounter: Payer: Self-pay | Admitting: Neurology

## 2022-01-10 ENCOUNTER — Ambulatory Visit: Payer: Medicare Other | Admitting: Physical Therapy

## 2022-01-11 ENCOUNTER — Telehealth: Payer: Self-pay

## 2022-01-11 NOTE — Telephone Encounter (Signed)
Pa for Dantrolene 100 mg capsules.   (Key: [id number])  [facility] has not yet replied to your PA request. You may close this dialog, return to your dashboard, and perform other tasks.  To check for an update later, open this request again from your dashboard.  If [facility] has not replied to your request within 24 hours for urgent requests or 72 hours for standard requests, please reach out to the plan using the phone number located on the back on the [employer] card.

## 2022-01-12 ENCOUNTER — Encounter: Payer: Self-pay | Admitting: *Deleted

## 2022-01-12 NOTE — Telephone Encounter (Signed)
PA approved until further notice. May call [phone] for any questions. The patient has been notified.

## 2022-01-18 ENCOUNTER — Telehealth: Payer: Self-pay

## 2022-01-18 NOTE — Telephone Encounter (Signed)
Called pt to schedule colon at [facility]. LVM requesting returned call. ?

## 2022-01-19 ENCOUNTER — Other Ambulatory Visit: Payer: Self-pay

## 2022-01-19 DIAGNOSIS — R109 Unspecified abdominal pain: Secondary | ICD-10-CM

## 2022-01-19 DIAGNOSIS — K5900 Constipation, unspecified: Secondary | ICD-10-CM

## 2022-01-19 NOTE — Telephone Encounter (Signed)
Returned pt call. Pt scheduled @ [facility] on 04/13/22 @ 1015am, arrival time 845am. Amb referral placed for auth purposes. Prep instructions sent via My Chart per pt request. ?

## 2022-01-19 NOTE — Telephone Encounter (Signed)
Patient returned your phone call.  Please call back.  Thank you. 

## 2022-01-19 NOTE — Telephone Encounter (Signed)
SECOND ATTEMPT: ? ?Called pt to schedule colonoscopy. LVM requesting returned call. ?

## 2022-01-26 ENCOUNTER — Encounter: Payer: Self-pay | Admitting: Family Medicine

## 2022-02-01 ENCOUNTER — Other Ambulatory Visit: Payer: Medicare Other | Admitting: General Practice

## 2022-02-01 ENCOUNTER — Encounter: Payer: Self-pay | Admitting: General Practice

## 2022-02-01 ENCOUNTER — Other Ambulatory Visit: Payer: Self-pay

## 2022-02-01 ENCOUNTER — Other Ambulatory Visit (HOSPITAL_COMMUNITY)
Admission: RE | Admit: 2022-02-01 | Discharge: 2022-02-01 | Disposition: A | Discharge status: Home / Self Care | Payer: Medicare Other | Source: Ambulatory Visit | Attending: Family Medicine | Admitting: Family Medicine

## 2022-02-01 VITALS — BP 101/56 | HR 75 | Ht 62.0 in | Wt 131.0 lb

## 2022-02-01 DIAGNOSIS — N898 Other specified noninflammatory disorders of vagina: Secondary | ICD-10-CM | POA: Diagnosis present

## 2022-02-01 MED ORDER — ACIDOPHILUS PROBIOTIC 10 MG PO CAPS: 1.0000 | ORAL_CAPSULE | Freq: Every day | ORAL | 3 refills | Status: DC

## 2022-02-01 NOTE — Progress Notes (Signed)
Patient presents to office today for ongoing vaginal discharge for over a week now. She reports her caregivers have noticed increased white vaginal discharge which at times is runny & clotted. She denies odor but has noticed labial soreness and caregivers have reported seeing some irritation/redness at times. She reported this to her PCP which gave her Diflucan x5 that she took daily. Despite this, her discharge continues. Instructed patient's partner in self swab and assisted holding patient's legs. Specimen collected by patient's partner. Advised results would be back tomorrow or Friday and sent to mychart. Discussed we will notify her of any abnormal results and medications needed would be sent to pharmacy. Patient verbalized understanding and reports being on antibiotics almost daily to prevent UTIs which causes frequent yeast infections. She is requesting a Rx for probiotics in hopes that insurance will cover it. Told patient we can try to send that to the pharmacy to see if they'll pay for it. Patient verbalized understanding. ? ?Koren Bound RN BSN ?02/01/22 ? ?

## 2022-02-02 LAB — CERVICOVAGINAL ANCILLARY ONLY
Bacterial Vaginitis (gardnerella): NEGATIVE
Candida Glabrata: NEGATIVE
Candida Vaginitis: NEGATIVE
Chlamydia: NEGATIVE
Comment: NEGATIVE
Comment: NEGATIVE
Comment: NEGATIVE
Comment: NEGATIVE
Comment: NEGATIVE
Comment: NORMAL
Neisseria Gonorrhea: NEGATIVE
Trichomonas: NEGATIVE

## 2022-02-03 ENCOUNTER — Other Ambulatory Visit: Payer: Self-pay

## 2022-02-03 ENCOUNTER — Ambulatory Visit: Payer: Medicare Other | Attending: Internal Medicine

## 2022-02-03 DIAGNOSIS — M6281 Muscle weakness (generalized): Secondary | ICD-10-CM | POA: Insufficient documentation

## 2022-02-03 NOTE — Therapy (Signed)
[facility] ?[address] ?[place], [place], [address] ?Phone: [phone]   Fax:  [phone] ? ?Physical Therapy Evaluation ? ?Patient Details  ?Name: Veronica Huff ?MRN: [id number] ?Date of Birth: 28-Aug-1986 ?Referring Provider (PT): Annita Brod ? ? ?Encounter Date: 02/03/2022 ? ? PT End of Session - 02/03/22 1232   ? ? Visit Number 1   ? Number of Visits 1   ? Date for PT Re-Evaluation 02/03/22   ? Authorization Type med A/medicaid   ? PT Start Time 1230   ? PT Stop Time 1340   ? PT Time Calculation (min) 70 min   ? Activity Tolerance Patient tolerated treatment well   ? Behavior During Therapy [facility] for tasks assessed/performed   ? ?  ?  ? ?  ? ? ?Past Medical History:  ?Diagnosis Date  ? Anxiety   ? CVA (cerebral infarction)   ? 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  ? Depression   ? Zoloft has helped-not having any current depression  ? Edema extremities   ? Feet and legs-mostly in feet  ? Headache(784.0)   ? Prior to imipramine-none currently  ? Pneumonia   ? Quadriplegia (HCC)   ? Secondary to stroke at age 22-has trach-lives with boyfriend-motorized w/c-joystick to chin to drive w/c--I & O cath thru suprapubic stoma - has nursing care 16 hrs daily and boyfriend able to help pt the other hours of day.  ? Recurrent UTI (urinary tract infection)   ? Due to suprapubic stoma, repetitive catheterizations  ? Sleep apnea   ? On ventilator at night -has oxygen but rarely needs unless she has a cold and sats drop  ? Stroke [facility])   ? ? ?Past Surgical History:  ?Procedure Laterality Date  ? BLADDER SURGERY  2009,2012  ? BRONCHOSCOPY  2006  ? CHOLECYSTECTOMY  11/18/2012  ? Procedure: LAPAROSCOPIC CHOLECYSTECTOMY;  Surgeon: Axel Filler, MD;  Location: [facility] ORS;  Service: General;  Laterality: N/A;  ? ESOPHAGOGASTRODUODENOSCOPY    ? GASTROSTOMY TUBE PLACEMENT  2000  ? ILEOSTOMY  2006  ?  MICROLARYNGOSCOPY WITH LASER AND BALLOON DILATION N/A 09/27/2016  ? Procedure: MICROLARYNGOSCOPY WITH LASER AND BALLOON DILATION;  Surgeon: Christia Reading, MD;  Location: [facility] OR;  Service: ENT;  Laterality: N/A;  micro direct laryngoscopy with co2 laser balloon dilation of trachea  ? REMOVAL OF GASTROSTOMY TUBE    ? SPINE SURGERY  2004,2011  ? TENDON REPAIR  2012  ? Left wrist  ? THROAT SURGERY  2004  ? TRACHEOSTOMY  2000  ? wirst surgery Left   ? WISDOM TOOTH EXTRACTION  2008  ? ? ?There were no vitals filed for this visit. ? ? ? Subjective Assessment - 02/03/22 1513   ? ? Subjective Pt presents for power wheelchair evaluation to get a replacement chair as her current one is > 25 years old. Pt is dependent with all transfers and relies on power mobility to access her home.   ? Patient is accompained by: Family member   boyfriend, Ines Bloomer, and caregiver Misha  ? Pertinent History  Bladder stones    Congenital quadriplegia (HCC)    Gastroesophageal reflux disease    Neurogenic bladder, NOS    Other calculus in bladder    Paralysis, unspecified    Scoliosis associated with other condition   s/p spinal fusion 2000    Sleep apnea    Stroke [facility])   PMH brain  stem stroke in 2000    Tracheostomy status (HCC)   ? Patient Stated Goals To obtain new power wheelchair to be able to access her home.   ? Currently in Pain? No/denies   ? ?  ?  ? ?  ? ? ? ? ? [facility] PT Assessment - 02/03/22 1232   ? ?  ? Assessment  ? Medical Diagnosis quadriplegia   ? Referring Provider (PT) Annita BrodPhilip Asenso   ? Onset Date/Surgical Date 10/05/21   ?  ? Prior Function  ? Level of Independence Needs assistance with ADLs   ? Level of Independence - Bath Total   ? Toileting Total   ? Dressing Total   ? Grooming Total   ? ?  ?  ? ?  ? ? ? ? ? ? ? ? ? ? ? ? ? ?Objective measurements completed on examination: See above findings.  ? ? ? ? ? ?Wheelchair evaluation was completed at today's evaluation.  Paperwork completed and provided to wheelchair vendor from [employer].   Face to face evaluation with MD to follow for further sign off for power mobility.  Copy of PT letter of medical necessity available in EPIC. ?  ? ? ? ? ? ? ? ? PT Education - 02/03/22 1516   ? ? Education Details Will request order for chair for shower and process with that.   ? Person(s) Educated Secretary/administratoratient;Caregiver(s)   ? Methods Explanation   ? Comprehension Verbalized understanding   ? ?  ?  ? ?  ? ? ? ? ? ? ? ? ? ? ? ? ? ? Plan - 02/03/22 1518   ? ? Clinical Impression Statement Pt presents for power wheelchair evaluation. Her current chair is >35 years old. Pt has had quadriplegia for past 23 years since 2000 from brainstem stroke. Pt has 2-/5 elbow flexion and slight shoulder shrug but no other active movement present below neck. She is impaired light touch sensation in legs feeling inconsistently. Pt has trach and uses a ventilator only at night. Does in and out caths with caregivers performing through her suprapubic stoma. She has not ability to sit up without total assist. Pt is unable to propel a manual w/c due to quadriplegia, unsafe to use a scooter as has not trunk control. Is a Hoyer lift to transfer. Pt's current set up in her powerchair is supporting her well so wish to request the same lateral trunk and thigh supports. Will need tilt and recline to be able to perform pressure relief and for in and out cath management. Pt would also benefit from elevating leg rests to help with swelling in legs. Elevating seat would also be helpful to allow her to see in to cabinets to aide with meal prep, adjust for table heights, assist with grooming, help with transfers. Pt will need a chin joystick to propel as unable to utilize hands. PT will also request order for shower chair w/c to allow her access to shower.   ? Personal Factors and Comorbidities Comorbidity 3+;Time since onset of injury/illness/exacerbation   ? Comorbidities Bladder stones    Congenital quadriplegia (HCC)    Gastroesophageal reflux disease     Neurogenic bladder, NOS    Other calculus in bladder    Paralysis, unspecified    Scoliosis associated with other condition   s/p spinal fusion 2000    Sleep apnea    Stroke [facility](HCC)   PMH brain stem stroke in 2000    Tracheostomy status (  HCC)   ? Examination-Activity Limitations Bathing;Locomotion Level   ? Stability/Clinical Decision Making Evolving/Moderate complexity   ? Clinical Decision Making High   ? Rehab Potential Good   ? PT Frequency One time visit   ? PT Next Visit Plan w/c eval   ? Consulted and Agree with Plan of Care Patient;Family member/caregiver;Other (Comment)   ? Family Member Consulted boyfriend, Shawn and nurse, [place]   ? ?  ?  ? ?  ? ? ?Patient will benefit from skilled therapeutic intervention in order to improve the following deficits and impairments:  Decreased mobility ? ?Visit Diagnosis: ?Muscle weakness (generalized) ? ? ? ? ?Problem List ?Patient Active Problem List  ? Diagnosis Date Noted  ? Palpitations 11/10/2020  ? Dysphagia 11/01/2020  ? Dysphagia following other cerebrovascular disease 11/01/2020  ? Spastic tetraplegia with rigidity syndrome (HCC) 12/11/2017  ? Allergic rhinitis 09/12/2016  ? Allergic conjunctivitis 09/12/2016  ? Chronic respiratory failure (HCC) 01/17/2016  ? Spastic tetraplegia (HCC) 09/29/2015  ? Chronic daily headache 09/29/2015  ? Brain stem infarction (HCC) 07/30/2015  ? Anger reaction 07/30/2015  ? Generalized anxiety disorder 07/30/2015  ? Venous (peripheral) insufficiency 12/24/2014  ? Quadriplegia (HCC)   ? CVA (cerebral vascular accident) [facility])   ? ? ?Ronn Melena, PT, DPT, NCS ?02/03/2022, 3:34 PM ? ?[place] [facility] ?[address] ?[place], [place], [address] ?Phone: [phone]   Fax:  [phone] ? ?Name: JOSPHINE LAFFEY ?MRN: [id number] ?Date of Birth: Dec 09, 1985 ? ? ?

## 2022-02-27 ENCOUNTER — Encounter: Payer: Self-pay | Admitting: Family Medicine

## 2022-03-15 ENCOUNTER — Encounter: Payer: Self-pay | Admitting: Internal Medicine

## 2022-03-15 ENCOUNTER — Ambulatory Visit: Payer: Medicare Other | Admitting: Neurology

## 2022-04-06 ENCOUNTER — Encounter: Payer: Self-pay | Admitting: Family Medicine

## 2022-04-06 ENCOUNTER — Ambulatory Visit (INDEPENDENT_AMBULATORY_CARE_PROVIDER_SITE_OTHER): Payer: Medicare Other | Admitting: Family Medicine

## 2022-04-06 ENCOUNTER — Encounter (HOSPITAL_COMMUNITY): Payer: Self-pay | Admitting: Internal Medicine

## 2022-04-06 ENCOUNTER — Other Ambulatory Visit (HOSPITAL_COMMUNITY)
Admission: RE | Admit: 2022-04-06 | Discharge: 2022-04-06 | Disposition: A | Discharge status: Home / Self Care | Payer: Medicare Other | Source: Ambulatory Visit | Attending: Family Medicine | Admitting: Family Medicine

## 2022-04-06 VITALS — BP 120/68 | HR 99

## 2022-04-06 DIAGNOSIS — N898 Other specified noninflammatory disorders of vagina: Secondary | ICD-10-CM

## 2022-04-06 NOTE — Progress Notes (Signed)
   GYNECOLOGY PROBLEM  VISIT ENCOUNTER NOTE  Subjective:   Veronica Huff is a 36 y.o. G0P0000 female here for a problem GYN visit.  Current complaints: vaginal discharge, irritation.  Patient is full assist and wheel chair bound with Quadriplegia.  Arrives with female partner. She is sexually active with partner. Has care takers/home health.  Denies abnormal vaginal bleeding, discharge, pelvic pain, problems with intercourse or other gynecologic concerns.    Gynecologic History No LMP recorded (lmp unknown). Patient has had an injection.  Contraception: Depo-Provera injections  Health Maintenance Due  Topic Date Due   Hepatitis C Screening  Never done   COVID-19 Vaccine (5 - Booster for Moderna series) 10/24/2021    The following portions of the patient's history were reviewed and updated as appropriate: allergies, current medications, past family history, past medical history, past social history, past surgical history and problem list.  Review of Systems Pertinent items are noted in HPI.   Objective:  BP 120/68   Pulse 99   LMP  (LMP Unknown)  Gen: well appearing, NAD HEENT: no scleral icterus CV: RR Lung: Normal WOB Ext: warm well perfused  PELVIC: RN and partner helped with positioning.  Normal appearing external genitalia; normal appearing vaginal mucosa and cervix.  No abnormal discharge noted, no odor, White but not chunky/cottage cheese like d/c.  Normal uterine size, no other palpable masses, no uterine or adnexal tenderness.   Assessment and Plan:  1. Vaginal discharge Suspect dysbiosis with yeast and BV potentially. High risk for anearobic bacteria overgrowth/vaginal irritation/fecal contamination due to immobility and wheelchair bound status despite excellent care and hygiene.  - Discussed treating with metrogel to help control sx - Cervicovaginal ancillary only( [place]) - metroNIDAZOLE (METROGEL) 0.75 % vaginal gel; Place 1 Applicatorful vaginally at  bedtime. Apply one applicatorful to vagina at bedtime for 10 days, then twice a week for 6 months.  Dispense: 70 g; Refill: 5 - fluconazole (DIFLUCAN) 150 MG tablet; Take 1 tablet (150 mg total) by mouth once for 1 dose. Can take additional dose three days later if symptoms persist  Dispense: 1 tablet; Refill: 0    Please refer to After Visit Summary for other counseling recommendations.   No follow-ups on file.  Caren Macadam, MD, MPH, ABFM Attending [place] for [facility]

## 2022-04-07 LAB — CERVICOVAGINAL ANCILLARY ONLY
Bacterial Vaginitis (gardnerella): NEGATIVE
Candida Glabrata: NEGATIVE
Candida Vaginitis: POSITIVE — AB
Chlamydia: NEGATIVE
Comment: NEGATIVE
Comment: NEGATIVE
Comment: NEGATIVE
Comment: NEGATIVE
Comment: NEGATIVE
Comment: NORMAL
Neisseria Gonorrhea: NEGATIVE
Trichomonas: NEGATIVE

## 2022-04-07 MED ORDER — FLUCONAZOLE 150 MG PO TABS: 150.0000 mg | ORAL_TABLET | Freq: Once | ORAL | 0 refills | Status: AC

## 2022-04-07 MED ORDER — METRONIDAZOLE 0.75 % VA GEL: 1.0000 | Freq: Every day | VAGINAL | 5 refills | Status: DC

## 2022-04-12 ENCOUNTER — Ambulatory Visit: Payer: Medicare Other | Admitting: Family Medicine

## 2022-04-13 ENCOUNTER — Ambulatory Visit (HOSPITAL_COMMUNITY): Admission: RE | Admit: 2022-04-13 | Payer: Medicare Other | Source: Home / Self Care | Admitting: Internal Medicine

## 2022-04-13 SURGERY — COLONOSCOPY WITH PROPOFOL
Anesthesia: Monitor Anesthesia Care

## 2022-04-14 ENCOUNTER — Encounter: Payer: Self-pay | Admitting: Family Medicine

## 2022-04-18 ENCOUNTER — Encounter: Payer: Self-pay | Admitting: Pulmonary Disease

## 2022-04-18 NOTE — Telephone Encounter (Signed)
Dr Craige Cotta, pt had prevnar 13 09-20-18. She is asking when next pneumovax due. Please advise, thanks I advised her to schedule appt with you as she is overdue for rov. Thanks!

## 2022-05-08 ENCOUNTER — Telehealth: Payer: Self-pay | Admitting: Pulmonary Disease

## 2022-05-10 NOTE — Telephone Encounter (Signed)
Spoke with Veronica Huff.  Discussed indications for when to treat with antibiotics for respiratory infection.  Explained Veronica Huff likely is chronically colonized with Pseudomonas, and a positive sputum culture result by itself does not constitute the need for antibiotic therapy in the absence of other objective findings to indicate an active respiratory infection.

## 2022-05-10 NOTE — Telephone Encounter (Signed)
Rozanna Box states she is patients  new PCP starting on 4/28. They have had 3 visits with patient She is form a home based PCP office. She states Beverly from [facility] (patients primary home health nurse) states that patient frequently asks for sputum cultures to be done and antibiotics to be given. She states that the patient always comes back positive for pseudomonas and she is wanting to know from Dr. Craige Cotta  how often she should treat or prescribe antibiotics for the patient in these situations if she is always positive for pseudomonas. She states she doesn't want to over prescribe for her and doesn't want to interfere with pulmonary intervention since Dr. Craige Cotta has treated this in the past for patient. Would like input from Dr. Craige Cotta.

## 2022-05-12 ENCOUNTER — Ambulatory Visit: Payer: Medicare Other | Admitting: Pulmonary Disease

## 2022-05-19 ENCOUNTER — Other Ambulatory Visit: Payer: Self-pay

## 2022-05-19 DIAGNOSIS — R609 Edema, unspecified: Secondary | ICD-10-CM

## 2022-06-03 ENCOUNTER — Encounter: Payer: Self-pay | Admitting: Family Medicine

## 2022-06-07 ENCOUNTER — Encounter: Payer: Self-pay | Admitting: Pulmonary Disease

## 2022-06-07 ENCOUNTER — Ambulatory Visit (INDEPENDENT_AMBULATORY_CARE_PROVIDER_SITE_OTHER): Payer: Medicare Other | Admitting: Pulmonary Disease

## 2022-06-07 VITALS — BP 118/70 | HR 88 | Ht 62.0 in | Wt 130.0 lb

## 2022-06-07 DIAGNOSIS — J22 Unspecified acute lower respiratory infection: Secondary | ICD-10-CM | POA: Diagnosis not present

## 2022-06-07 DIAGNOSIS — J9611 Chronic respiratory failure with hypoxia: Secondary | ICD-10-CM

## 2022-06-07 DIAGNOSIS — Z930 Tracheostomy status: Secondary | ICD-10-CM

## 2022-06-07 MED ORDER — ALBUTEROL SULFATE (2.5 MG/3ML) 0.083% IN NEBU: 2.5000 mg | INHALATION_SOLUTION | Freq: Four times a day (QID) | RESPIRATORY_TRACT | 5 refills | Status: DC | PRN

## 2022-06-07 MED ORDER — SULFAMETHOXAZOLE-TRIMETHOPRIM 800-160 MG PO TABS
1.0000 | ORAL_TABLET | Freq: Two times a day (BID) | ORAL | 0 refills | Status: DC
Start: 2022-06-07 — End: 2022-07-20

## 2022-06-07 NOTE — Patient Instructions (Signed)
Bactrim twice per day for 10 days  Will arrange for referral to infectious disease specialist  Follow up in 3 months

## 2022-06-07 NOTE — Progress Notes (Signed)
[facility]  Chief Complaint  Patient presents with   Follow-up    Pt wants to know if she might be able to be diagnosed with long-covid. States when the sputum results, she is having to be suctioned out more frequently and also states she is using her nebulizer twice a day.     Past Surgical History:  She  has a past surgical history that includes Ileostomy (2006); Esophagogastroduodenoscopy; Spine surgery (6387,5643); Tracheostomy (2000); Gastrostomy tube placement (2000); Bronchoscopy (2006); Throat surgery (2004); Wisdom tooth extraction (2008); Bladder surgery (3295,1884); Tendon repair (2012); Removal of gastrostomy tube; Cholecystectomy (11/18/2012); wirst surgery (Left); and Microlaryngoscopy with laser and balloon dilation (N/A, 09/27/2016).  Past Medical History:  CVA with brain stem involvement, Quadriplegia, Depression, Neurogenic bladder, Recurrent UTI, GERD, Scoliosis  Constitutional:  BP 118/70 (BP Location: Right Arm, Patient Position: Sitting, Cuff Size: Normal)   Pulse 88   Ht 5\' 2"  (1.575 m)   Wt 130 lb (59 kg)   SpO2 97% Comment: RA  BMI 23.78 kg/m   Brief Summary:  Veronica Huff is a 36 y.o. female withchronic respiratory failure and sleep disordered breathing 2nd to brain stem infarct at age 48 with resulting quadriplegia.  She has chronic tracheostomy with nocturnal ventilator support.      Subjective:   She is here with her aide.  She has more cough and chest congestion again.  Bringing up more sputum, and now has a yellow color.  No drainage around trach site.  Feeling more short of breath and feverish at times.  More recent sputum cultures have grown Pseudomonas and Stenotrophomonas.   Physical Exam:   Appearance - sitting in motorized wheelchair  ENMT - no sinus tenderness, no oral exudate, no LAN, Mallampati 2 airway, no stridor, trach site clean  Respiratory - b/l crackles  CV - s1s2 regular rate  and rhythm, no murmurs  Ext - no clubbing, no edema  Skin - no rashes  Psych - normal mood and affect    Sleep Tests:  ONO with home vent 10/12/16 >> test time 3 hrs 51 min.  Mean SpO2 92.9%, low SpO2 74%.  Spent 14 min with SpO2 < 88%.  Social History:  She  reports that she has never smoked. She has never used smokeless tobacco. She reports that she does not drink alcohol and does not use drugs.  Family History:  Her family history includes Cancer in her maternal aunt; Diabetes in her maternal grandfather and mother; Heart disease in her maternal grandfather and paternal grandfather; Hypertension in her mother; Other in her sister; Varicose Veins in her mother.     Assessment/Plan:   Recurrent lower respiratory infections. - most recent sputum culture growing Pseudomonas and Stenotrophomonas - she has multiple antibiotic allergies - will give her course of bactrim - will arrange for referral to Infectious Disease - defer additional vaccinations until her current respiratory infection has improved  Sleep disordered breathing with hx of obstructive sleep apnea. Chronic hypoxic respiratory failure. - continue Astral home vent SIMV rate 7, Vt 350, PS 10, PEEP 5, I time 1, High pressure alarm 45 - gets supplies from [facility] - she might need more home nursing care if her respiratory infections starting impacting her respiratory status more - prn albuterol   Tracheostomy status. - followed by Dr. 10/14/16 with ENT - she has asked that pulmonary consolidate getting vent and trach supplies   Cryptogenic stroke at at 62  with spastic quadriplegia. - followed by Dr. Levert Feinstein with [facility]   Neurogenic bladder. - followed by Urology at [facility]  Time Spent Involved in Patient Care on Day of Examination:  36 minutes  Follow up:   Patient Instructions  Bactrim twice per day for 10 days  Will arrange for referral to infectious disease specialist  Follow up in  3 months  Medication List:   Allergies as of 06/07/2022       Reactions   Bee Venom Shortness Of Breath, Swelling, Anaphylaxis   SHOCK   Amoxicillin Hives, Itching   Tolerates Keflex, Rocephin Has patient had a PCN reaction causing immediate rash, facial/tongue/throat swelling, SOB or lightheadedness with hypotension: no Has patient had a PCN reaction causing severe rash involving mucus membranes or skin necrosis: no Has patient had a PCN reaction that required hospitalization: no Has patient had a PCN reaction occurring within the last 10 years: unknown If all of the above answers are "NO", then may proceed with Cephalosporin use.   Ciprofloxacin Hives, Rash   Tobramycin Hives, Rash   Erythromycin Other (See Comments)   UNSPECIFIED REACTION    Trazodone Other (See Comments)   UNSPECIFIED REACTION  Eyes shake   Trazodone And Nefazodone    UNSPECIFIED REACTION    Morphine And Related Other (See Comments)   "GOES CRAZY"   Vantin [cefpodoxime] Palpitations        Medication List        Accurate as of June 07, 2022  4:45 PM. If you have any questions, ask your nurse or doctor.          STOP taking these medications    sulfamethoxazole-trimethoprim 400-80 MG tablet Commonly known as: BACTRIM Replaced by: sulfamethoxazole-trimethoprim 800-160 MG tablet Stopped by: Coralyn Helling, MD       TAKE these medications    Acidophilus Probiotic 10 MG Caps Take 1 tablet by mouth daily.   albuterol 108 (90 Base) MCG/ACT inhaler Commonly known as: VENTOLIN HFA Inhale into the lungs. What changed: Another medication with the same name was changed. Make sure you understand how and when to take each. Changed by: Coralyn Helling, MD   albuterol (2.5 MG/3ML) 0.083% nebulizer solution Commonly known as: PROVENTIL Take 3 mLs (2.5 mg total) by nebulization every 6 (six) hours as needed for wheezing or shortness of breath. What changed: See the new instructions. Changed by: Coralyn Helling, MD   baclofen 20 MG tablet Commonly known as: LIORESAL TAKE TWO TABLETS BY MOUTH FOUR TIMES A DAY   Benefiber Chew Benefiber Sugar Free (dextrin) 1 gram chewable tablet  Take 1 tablet by oral route for 90 days.   busPIRone 5 MG tablet Commonly known as: BUSPAR Take 5 mg by mouth 3 (three) times daily.   cephALEXin 250 MG capsule Commonly known as: KEFLEX Take 250 mg by mouth daily.   clotrimazole-betamethasone cream Commonly known as: LOTRISONE Apply 1 application topically daily as needed (for irritation). On labium   dantrolene 100 MG capsule Commonly known as: DANTRIUM TAKE 1 CAPSULE BY MOUTH 3 TIMES DAILY.   diphenhydrAMINE 25 MG tablet Commonly known as: BENADRYL Take 1 tablet (25 mg total) by mouth every 6 (six) hours as needed for itching or allergies. Take with Levofloxacin to prevent allergic reaction   DSS 100 MG Caps Take 100 mg by mouth in the morning, at noon, and at bedtime.   EpiPen 2-Pak 0.3 mg/0.3 mL Soaj injection Generic drug: EPINEPHrine Inject 0.3 mg into the  muscle daily as needed for anaphylaxis.   fluticasone 50 MCG/ACT nasal spray Commonly known as: FLONASE INSERT 2 SPRAYS IN EACH NOSTRIL EVERY DAY FOR STUFFY NOSE OR DRAINAGE   gabapentin 300 MG capsule Commonly known as: NEURONTIN Take 300 mg by mouth 3 (three) times daily.   hydrocortisone cream 0.5 % Apply 1 application topically 2 (two) times daily. To hairline and brows   ibuprofen 200 MG tablet Commonly known as: ADVIL Take 400 mg by mouth every 6 (six) hours as needed for fever, headache, mild pain, moderate pain or cramping.   imipramine 50 MG tablet Commonly known as: TOFRANIL TAKE 2 TABLETS (100 MG TOTAL) BY MOUTH AT BEDTIME.   ketorolac 0.5 % ophthalmic solution Commonly known as: ACULAR Place 1 drop into both eyes daily.   lactulose 10 GM/15ML solution Commonly known as: CHRONULAC Take 10 g by mouth daily as needed for mild constipation.   levocetirizine 5 MG  tablet Commonly known as: XYZAL TAKE 1 TABLET BY MOUTH EVERY DAY AS NEEDED   medroxyPROGESTERone 150 MG/ML injection Commonly known as: DEPO-PROVERA Inject 1 mL (150 mg total) into the muscle every 3 (three) months.   metoprolol succinate 25 MG 24 hr tablet Commonly known as: Toprol XL Take 1 tablet (25 mg total) by mouth in the morning and at bedtime.   metroNIDAZOLE 0.75 % vaginal gel Commonly known as: METROGEL Place 1 Applicatorful vaginally at bedtime. Apply one applicatorful to vagina at bedtime for 10 days, then twice a week for 6 months.   multivitamin with minerals Tabs tablet Take 1 tablet by mouth daily.   mupirocin ointment 2 % Commonly known as: BACTROBAN 1 application by Tracheal Tube route daily. Applies to trach-stoma site   nystatin powder Commonly known as: MYCOSTATIN/NYSTOP Apply 1 application topically 2 (two) times daily as needed (skin irritation). Small amount . Rash   polyethylene glycol powder 17 GM/SCOOP powder Commonly known as: MiraLax Start taking 1 capful 3 times a day. Slowly cut back as needed until you have normal bowel movements.   senna 8.6 MG tablet Commonly known as: SENOKOT Take 1 tablet by mouth every other day.   sertraline 100 MG tablet Commonly known as: ZOLOFT Take 100 mg by mouth daily.   solifenacin 10 MG tablet Commonly known as: VESICARE Take 10 mg by mouth daily.   sucralfate 1 GM/10ML suspension Commonly known as: CARAFATE Take 1 g by mouth 3 (three) times daily.   sulfamethoxazole-trimethoprim 800-160 MG tablet Commonly known as: BACTRIM DS Take 1 tablet by mouth 2 (two) times daily. Replaces: sulfamethoxazole-trimethoprim 400-80 MG tablet Started by: Coralyn Helling, MD   topiramate 50 MG tablet Commonly known as: TOPAMAX Take 1 tablet (50 mg total) by mouth at bedtime.        Signature:  Coralyn Helling, MD [facility]/Critical Care Pager - [phone] 06/07/2022, 4:45 PM

## 2022-06-07 NOTE — Telephone Encounter (Signed)
Dr. Craige Cotta, please see mychart message as well as attached media and advise.

## 2022-06-09 NOTE — Telephone Encounter (Signed)
Received a message from patient asking if she was to stop the Keflex while on Bactrim. Per her chart, she is on a daily dose of Keflex 250mg . Dr. sent in Bactrim 800-160mg .   Craige Cotta, can you please advise since Dr. Maralyn Sago is not available? Thanks!

## 2022-06-14 ENCOUNTER — Encounter: Payer: Self-pay | Admitting: Family Medicine

## 2022-06-15 ENCOUNTER — Emergency Department (HOSPITAL_COMMUNITY): Payer: Medicare Other

## 2022-06-15 ENCOUNTER — Emergency Department (HOSPITAL_COMMUNITY)
Admission: EM | Admit: 2022-06-15 | Discharge: 2022-06-15 | Disposition: A | Discharge status: Home / Self Care | Payer: Medicare Other | Attending: Emergency Medicine | Admitting: Emergency Medicine

## 2022-06-15 ENCOUNTER — Other Ambulatory Visit: Payer: Self-pay

## 2022-06-15 ENCOUNTER — Encounter (HOSPITAL_COMMUNITY): Payer: Self-pay | Admitting: Emergency Medicine

## 2022-06-15 DIAGNOSIS — K5900 Constipation, unspecified: Secondary | ICD-10-CM | POA: Diagnosis not present

## 2022-06-15 DIAGNOSIS — R1030 Lower abdominal pain, unspecified: Secondary | ICD-10-CM | POA: Insufficient documentation

## 2022-06-15 DIAGNOSIS — N9489 Other specified conditions associated with female genital organs and menstrual cycle: Secondary | ICD-10-CM | POA: Insufficient documentation

## 2022-06-15 DIAGNOSIS — R109 Unspecified abdominal pain: Secondary | ICD-10-CM | POA: Diagnosis present

## 2022-06-15 LAB — CBC WITH DIFFERENTIAL/PLATELET
Abs Immature Granulocytes: 0.01 10*3/uL (ref 0.00–0.07)
Basophils Absolute: 0 10*3/uL (ref 0.0–0.1)
Basophils Relative: 0 %
Eosinophils Absolute: 0 10*3/uL (ref 0.0–0.5)
Eosinophils Relative: 0 %
HCT: 40.6 % (ref 36.0–46.0)
Hemoglobin: 13.5 g/dL (ref 12.0–15.0)
Immature Granulocytes: 0 %
Lymphocytes Relative: 33 %
Lymphs Abs: 1.3 10*3/uL (ref 0.7–4.0)
MCH: 29.9 pg (ref 26.0–34.0)
MCHC: 33.3 g/dL (ref 30.0–36.0)
MCV: 89.8 fL (ref 80.0–100.0)
Monocytes Absolute: 0.4 10*3/uL (ref 0.1–1.0)
Monocytes Relative: 10 %
Neutro Abs: 2.3 10*3/uL (ref 1.7–7.7)
Neutrophils Relative %: 57 %
Platelets: 144 10*3/uL — ABNORMAL LOW (ref 150–400)
RBC: 4.52 MIL/uL (ref 3.87–5.11)
RDW: 14 % (ref 11.5–15.5)
WBC: 4.1 10*3/uL (ref 4.0–10.5)
nRBC: 0 % (ref 0.0–0.2)

## 2022-06-15 LAB — COMPREHENSIVE METABOLIC PANEL
ALT: 14 U/L (ref 0–44)
AST: 16 U/L (ref 15–41)
Albumin: 3.6 g/dL (ref 3.5–5.0)
Alkaline Phosphatase: 55 U/L (ref 38–126)
Anion gap: 6 (ref 5–15)
BUN: 6 mg/dL (ref 6–20)
CO2: 24 mmol/L (ref 22–32)
Calcium: 8.6 mg/dL — ABNORMAL LOW (ref 8.9–10.3)
Chloride: 107 mmol/L (ref 98–111)
Creatinine, Ser: 0.3 mg/dL — ABNORMAL LOW (ref 0.44–1.00)
GFR, Estimated: >60 mL/min (ref >60)
Glucose, Bld: 97 mg/dL (ref 70–99)
Potassium: 3.5 mmol/L (ref 3.5–5.1)
Sodium: 137 mmol/L (ref 135–145)
Total Bilirubin: 0.3 mg/dL (ref 0.3–1.2)
Total Protein: 6.3 g/dL — ABNORMAL LOW (ref 6.5–8.1)

## 2022-06-15 LAB — I-STAT BETA HCG BLOOD, ED (MC, WL, AP ONLY): I-stat hCG, quantitative: <5 m[IU]/mL (ref <5)

## 2022-06-15 LAB — URINALYSIS, ROUTINE W REFLEX MICROSCOPIC
Bilirubin Urine: NEGATIVE
Glucose, UA: NEGATIVE mg/dL
Hgb urine dipstick: NEGATIVE
Ketones, ur: NEGATIVE mg/dL
Nitrite: NEGATIVE
Protein, ur: NEGATIVE mg/dL
Specific Gravity, Urine: 1.005 (ref 1.005–1.030)
pH: 7 (ref 5.0–8.0)

## 2022-06-15 LAB — PREGNANCY, URINE: Preg Test, Ur: NEGATIVE

## 2022-06-15 MED ORDER — SODIUM CHLORIDE 0.9 % IV BOLUS
1000.0000 mL | Freq: Once | INTRAVENOUS | Status: AC
  Administered 2022-06-15: 1000 mL via INTRAVENOUS

## 2022-06-15 NOTE — ED Triage Notes (Signed)
Patient presents due to constipation. She has not had a bowel movement in over a week.   HX: quadriplegic, trach   EMS vitals: 120 palpated BP 95 HR 97% SPO2 on room air 16 RR

## 2022-06-15 NOTE — ED Provider Notes (Signed)
[facility]-EMERGENCY DEPT Provider Note   CSN: [id number] Arrival date & time: 06/15/22  1302     History  Chief Complaint  Patient presents with   Abdominal Pain    Veronica Huff is a 36 y.o. female.  Patient here with abdominal pain.  She thinks that is likely due to constipation.  Has not had a bowel movement in about 10 or 11 days.  She has been tried some laxatives and enemas and some low doses of MiraLAX at home.  Her home health nurse tried to disimpact her but did not get much out.  She has not had any nausea or vomiting but she has pretty significant pain in her lower abdomen.  She feels distended.  She has had some abdominal surgeries in the past.  Nothing makes it worse or better.  She has passed little bit of gas.  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  Patient does suprapubic catheterizations.  Denies any fever or chills.  The history is provided by the patient.       Home Medications Prior to Admission medications   Medication Sig Start Date End Date Taking? Authorizing Provider  albuterol (PROVENTIL) (2.5 MG/3ML) 0.083% nebulizer solution Take 3 mLs (2.5 mg total) by nebulization every 6 (six) hours as needed for wheezing or shortness of breath. 06/07/22   Coralyn Helling, MD  albuterol (VENTOLIN HFA) 108 (90 Base) MCG/ACT inhaler Inhale into the lungs. 12/12/21   [provider]  baclofen (LIORESAL) 20 MG tablet TAKE TWO TABLETS BY MOUTH FOUR TIMES A DAY 07/22/21   Levert Feinstein, MD  busPIRone (BUSPAR) 5 MG tablet Take 5 mg by mouth 3 (three) times daily. 09/30/20   [provider]  cephALEXin (KEFLEX) 250 MG capsule Take 250 mg by mouth daily. 04/05/22   [provider]  clotrimazole-betamethasone (LOTRISONE) cream Apply 1 application topically daily as needed (for irritation). On labium 10/28/20   [provider]  dantrolene (DANTRIUM) 100 MG capsule TAKE 1  CAPSULE BY MOUTH 3 TIMES DAILY. 11/22/21   Glean Salvo, NP  diphenhydrAMINE (BENADRYL) 25 MG tablet Take 1 tablet (25 mg total) by mouth every 6 (six) hours as needed for itching or allergies. Take with Levofloxacin to prevent allergic reaction 05/04/21 07/05/21  Terald Sleeper, MD  Docusate Sodium (DSS) 100 MG CAPS Take 100 mg by mouth in the morning, at noon, and at bedtime. 12/23/20   [provider]  EPIPEN 2-PAK 0.3 MG/0.3ML SOAJ injection Inject 0.3 mg into the muscle daily as needed for anaphylaxis. 05/10/15   [provider]  fluticasone (FLONASE) 50 MCG/ACT nasal spray INSERT 2 SPRAYS IN EACH NOSTRIL EVERY DAY FOR STUFFY NOSE OR DRAINAGE 08/12/18   Bobbitt, Heywood Iles, MD  gabapentin (NEURONTIN) 300 MG capsule Take 300 mg by mouth 3 (three) times daily.    [provider]  hydrocortisone cream 0.5 % Apply 1 application topically 2 (two) times daily. To hairline and brows    [provider]  ibuprofen (ADVIL) 200 MG tablet Take 400 mg by mouth every 6 (six) hours as needed for fever, headache, mild pain, moderate pain or cramping.    [provider]  imipramine (TOFRANIL) 50 MG tablet TAKE 2 TABLETS (100 MG TOTAL) BY MOUTH AT BEDTIME. 11/22/21   Glean Salvo, NP  ketorolac (ACULAR) 0.5 % ophthalmic solution Place 1 drop into both eyes daily.  04/01/19  [provider]  Lactobacillus (ACIDOPHILUS PROBIOTIC) 10 MG CAPS Take 1 tablet by mouth daily. 02/01/22   Bryan Bing, MD  lactulose (CHRONULAC) 10 GM/15ML solution Take 10 g by mouth daily as needed for mild constipation. 08/06/20   [provider]  levocetirizine (XYZAL) 5 MG tablet TAKE 1 TABLET BY MOUTH EVERY DAY AS NEEDED 03/24/19   Bobbitt, Heywood Iles, MD  medroxyPROGESTERone (DEPO-PROVERA) 150 MG/ML injection Inject 1 mL (150 mg total) into the muscle every 3 (three) months. 10/26/21   Federico Flake, MD  metoprolol succinate (TOPROL XL) 25 MG 24 hr tablet Take 1  tablet (25 mg total) by mouth in the morning and at bedtime. 07/05/21   Lanier Prude, MD  metroNIDAZOLE (METROGEL) 0.75 % vaginal gel Place 1 Applicatorful vaginally at bedtime. Apply one applicatorful to vagina at bedtime for 10 days, then twice a week for 6 months. 04/07/22   Federico Flake, MD  Multiple Vitamin (MULTIVITAMIN WITH MINERALS) TABS tablet Take 1 tablet by mouth daily.    [provider]  mupirocin ointment (BACTROBAN) 2 % 1 application by Tracheal Tube route daily. Applies to trach-stoma site    [provider]  nystatin (MYCOSTATIN) powder Apply 1 application topically 2 (two) times daily as needed (skin irritation). Small amount . Rash    [provider]  polyethylene glycol powder (MIRALAX) 17 GM/SCOOP powder Start taking 1 capful 3 times a day. Slowly cut back as needed until you have normal bowel movements. 03/13/20   Nira Conn, MD  senna (SENOKOT) 8.6 MG tablet Take 1 tablet by mouth every other day.    [provider]  sertraline (ZOLOFT) 100 MG tablet Take 100 mg by mouth daily. 01/31/22   [provider]  solifenacin (VESICARE) 10 MG tablet Take 10 mg by mouth daily.    [provider]  sucralfate (CARAFATE) 1 GM/10ML suspension Take 1 g by mouth 3 (three) times daily.     [provider]  sulfamethoxazole-trimethoprim (BACTRIM DS) 800-160 MG tablet Take 1 tablet by mouth 2 (two) times daily. 06/07/22   Coralyn Helling, MD  topiramate (TOPAMAX) 50 MG tablet Take 1 tablet (50 mg total) by mouth at bedtime. 11/22/21   Glean Salvo, NP  Wheat Dextrin (BENEFIBER) CHEW Benefiber Sugar Free (dextrin) 1 gram chewable tablet  Take 1 tablet by oral route for 90 days.    [provider]      Allergies    Bee venom, Amoxicillin, Ciprofloxacin, Tobramycin, Erythromycin, Trazodone, Trazodone and nefazodone, Morphine and related, and Vantin [cefpodoxime]    Review of Systems   Review of  Systems  Physical Exam Updated Vital Signs BP 106/61   Pulse 87   Temp 98.3 F (36.8 C) (Oral)   Resp 14   Ht 5\' 2"  (1.575 m)   Wt 59 kg   SpO2 100%   BMI 23.78 kg/m  Physical Exam Vitals and nursing note reviewed.  Constitutional:      General: She is not in acute distress.    Appearance: She is well-developed. She is not ill-appearing.  HENT:     Head: Normocephalic and atraumatic.  Eyes:     Extraocular Movements: Extraocular movements intact.     Conjunctiva/sclera: Conjunctivae normal.     Pupils: Pupils are equal, round, and reactive to light.  Cardiovascular:     Rate and Rhythm: Normal rate and regular rhythm.     Pulses: Normal pulses.     Heart sounds:  Normal heart sounds. No murmur heard. Pulmonary:     Effort: Pulmonary effort is normal. No respiratory distress.     Breath sounds: Normal breath sounds.  Abdominal:     General: There is distension.     Palpations: Abdomen is soft.     Tenderness: There is abdominal tenderness.  Musculoskeletal:        General: No swelling.     Cervical back: Normal range of motion and neck supple.  Skin:    General: Skin is warm and dry.     Capillary Refill: Capillary refill takes less than 2 seconds.  Neurological:     Mental Status: She is alert.  Psychiatric:        Mood and Affect: Mood normal.     ED Results / Procedures / Treatments   Labs (all labs ordered are listed, but only abnormal results are displayed) Labs Reviewed  CBC WITH DIFFERENTIAL/PLATELET  COMPREHENSIVE METABOLIC PANEL  PREGNANCY, URINE  I-STAT BETA HCG BLOOD, ED ([facility], [facility], [facility] ONLY)    EKG None  Radiology No results found.  Procedures Procedures    Medications Ordered in ED Medications  sodium chloride 0.9 % bolus 1,000 mL (has no administration in time range)    ED Course/ Medical Decision Making/ A&P                           Medical Decision Making Amount and/or Complexity of Data Reviewed Labs: ordered. Radiology:  ordered.   Veronica Huff is here with abdominal pain.  History of stroke, quadriplegia with suprapubic catheterizations.  Has not had a bowel movement in over a week and feels constipated.  Disimpaction at home manually by home health nurse has not seemed to relieve her symptoms.  She has no nausea or vomiting.  But she has increasing abdominal pain and distention.  Has had some abdominal surgeries in the past.  Differential diagnosis is bowel obstruction versus constipation versus less likely diverticulitis.  We will get CBC, CMP, CT scan abdomen and pelvis.  Handed off to oncoming ED staff.  This chart was dictated using voice recognition software.  Despite best efforts to proofread,  errors can occur which can change the documentation meaning.         Final Clinical Impression(s) / ED Diagnoses Final diagnoses:  Abdominal pain, unspecified abdominal location    Rx / DC Orders ED Discharge Orders     None         Virgina Norfolk, DO 06/15/22 1502

## 2022-06-15 NOTE — Discharge Instructions (Addendum)
If you develop worsening, continued, or recurrent abdominal pain, uncontrolled vomiting, fever, chest or back pain, or any other new/concerning symptoms then return to the ER for evaluation.  

## 2022-06-15 NOTE — ED Provider Notes (Signed)
Care transferred to me.  Urinalysis unremarkable.  WBC normal.  Labs otherwise unrevealing.  CT abdomen and pelvis images viewed by myself and there is no obvious obstruction.  Radiology has read as well and there is no emergent condition though there is some constipation which goes along with her history.  Upon discussing, she feels well at this point and has little abdominal discomfort.  She has recently cut back her MiraLAX dosage because it was making her stools too thin so she will increase this now and she was given return precautions.  Stable for discharge.   Pricilla Loveless, MD 06/15/22 1730

## 2022-06-26 ENCOUNTER — Encounter (HOSPITAL_COMMUNITY): Payer: Medicare Other

## 2022-06-28 ENCOUNTER — Ambulatory Visit (HOSPITAL_COMMUNITY): Admission: RE | Admit: 2022-06-28 | Payer: Medicare Other | Source: Ambulatory Visit

## 2022-06-30 ENCOUNTER — Ambulatory Visit (INDEPENDENT_AMBULATORY_CARE_PROVIDER_SITE_OTHER): Payer: Medicare Other | Admitting: Physician Assistant

## 2022-06-30 ENCOUNTER — Encounter: Payer: Self-pay | Admitting: Physician Assistant

## 2022-06-30 VITALS — BP 103/67 | HR 85 | Temp 98.4°F | Resp 18 | Ht 62.0 in | Wt 130.0 lb

## 2022-06-30 DIAGNOSIS — I7389 Other specified peripheral vascular diseases: Secondary | ICD-10-CM

## 2022-06-30 NOTE — Progress Notes (Signed)
[facility] [place]   Reason for referral: blue skin changes B legs  History of Present Illness  Veronica Huff is a 36 y.o. female who presents with chief complaint: Bluish skin changes to B feet/LE.  She suffered a brainstem CVA at age 73 leaving her as an effective quadriplegic.  She altertnates her body position multiple times a day to prevent pressure ulcers.  Her LE are in a dependent position frequently and become bluish discolored with edema.    The patient has had no history of DVT, no history of varicose vein, no history of venous stasis ulcers, no history of  Lymphedema in lower legs.  There is no family history of venous disorders.  The patient has  used compression stockings in the past.  Past Medical History:  Diagnosis Date   Anxiety    CVA (cerebral infarction)    Ischemic brainstem stroke in 2011 at age 83 due to unknown reasons--quadriplegic after stroke.   Depression    Zoloft has helped-not having any current depression   Edema extremities    Feet and legs-mostly in feet   Headache(784.0)    Prior to imipramine-none currently   Pneumonia    Quadriplegia (HCC)    Secondary to stroke at age 39-has trach-lives with boyfriend-motorized w/c-joystick to chin to drive w/c--I & O cath thru suprapubic stoma - has nursing care 16 hrs daily and boyfriend able to help pt the other hours of day.   Recurrent UTI (urinary tract infection)    Due to suprapubic stoma, repetitive catheterizations   Sleep apnea    On ventilator at night -has oxygen but rarely needs unless she has a cold and sats drop   Stroke [facility])     Past Surgical History:  Procedure Laterality Date   BLADDER SURGERY  2009,2012   BRONCHOSCOPY  2006   CHOLECYSTECTOMY  11/18/2012   Procedure: LAPAROSCOPIC CHOLECYSTECTOMY;  Surgeon: Axel Filler, MD;  Location: [facility];  Service: General;  Laterality: N/A;   ESOPHAGOGASTRODUODENOSCOPY     GASTROSTOMY TUBE PLACEMENT  2000   ILEOSTOMY   2006   MICROLARYNGOSCOPY WITH LASER AND BALLOON DILATION N/A 09/27/2016   Procedure: MICROLARYNGOSCOPY WITH LASER AND BALLOON DILATION;  Surgeon: Christia Reading, MD;  Location: [facility] OR;  Service: ENT;  Laterality: N/A;  micro direct laryngoscopy with co2 laser balloon dilation of trachea   REMOVAL OF GASTROSTOMY TUBE     SPINE SURGERY  2004,2011   TENDON REPAIR  2012   Left wrist   THROAT SURGERY  2004   TRACHEOSTOMY  2000   wirst surgery Left    WISDOM TOOTH EXTRACTION  2008    Social History   Socioeconomic History   Marital status: Single    Spouse name: Not on file   Number of children: Not on file   Years of education: college    Highest education level: Not on file  Occupational History   Occupation: disabled  Tobacco Use   Smoking status: Never   Smokeless tobacco: Never  Vaping Use   Vaping Use: Never used  Substance and Sexual Activity   Alcohol use: No    Alcohol/week: 0.0 standard drinks of alcohol   Drug use: No    Types: Marijuana   Sexual activity: Yes    Birth control/protection: None  Other Topics Concern   Not on file  Social History Narrative   08/29/17 Lives at home with boyfriend, Has a RN Sharyl Nimrod that helps to care for her  Drinks no caffeine    Social Determinants of Corporate investment banker Strain: Not on file  Food Insecurity: Not on file  Transportation Needs: Not on file  Physical Activity: Not on file  Stress: Not on file  Social Connections: Not on file  Intimate Partner Violence: Not on file    Family History  Problem Relation Age of Onset   Diabetes Mother    Hypertension Mother    Varicose Veins Mother    Cancer Maternal Aunt        lung   Diabetes Maternal Grandfather    Heart disease Maternal Grandfather    Heart disease Paternal Grandfather    Other Sister        overdose    Current Outpatient Medications on File Prior to Visit  Medication Sig Dispense Refill   albuterol (PROVENTIL) (2.5 MG/3ML) 0.083% nebulizer  solution Take 3 mLs (2.5 mg total) by nebulization every 6 (six) hours as needed for wheezing or shortness of breath. 360 mL 5   albuterol (VENTOLIN HFA) 108 (90 Base) MCG/ACT inhaler Inhale into the lungs.     baclofen (LIORESAL) 20 MG tablet TAKE TWO TABLETS BY MOUTH FOUR TIMES A DAY 720 tablet 0   busPIRone (BUSPAR) 5 MG tablet Take 5 mg by mouth 3 (three) times daily.     cephALEXin (KEFLEX) 250 MG capsule Take 250 mg by mouth daily.     clotrimazole-betamethasone (LOTRISONE) cream Apply 1 application topically daily as needed (for irritation). On labium     dantrolene (DANTRIUM) 100 MG capsule TAKE 1 CAPSULE BY MOUTH 3 TIMES DAILY. 90 capsule 5   Docusate Sodium (DSS) 100 MG CAPS Take 100 mg by mouth in the morning, at noon, and at bedtime.     EPIPEN 2-PAK 0.3 MG/0.3ML SOAJ injection Inject 0.3 mg into the muscle daily as needed for anaphylaxis.  1   fluticasone (FLONASE) 50 MCG/ACT nasal spray INSERT 2 SPRAYS IN EACH NOSTRIL EVERY DAY FOR STUFFY NOSE OR DRAINAGE 16 g 5   gabapentin (NEURONTIN) 300 MG capsule Take 300 mg by mouth 3 (three) times daily.     hydrocortisone cream 0.5 % Apply 1 application topically 2 (two) times daily. To hairline and brows     ibuprofen (ADVIL) 200 MG tablet Take 400 mg by mouth every 6 (six) hours as needed for fever, headache, mild pain, moderate pain or cramping.     imipramine (TOFRANIL) 50 MG tablet TAKE 2 TABLETS (100 MG TOTAL) BY MOUTH AT BEDTIME. 60 tablet 11   ketorolac (ACULAR) 0.5 % ophthalmic solution Place 1 drop into both eyes daily.      Lactobacillus (ACIDOPHILUS PROBIOTIC) 10 MG CAPS Take 1 tablet by mouth daily. 30 capsule 3   lactulose (CHRONULAC) 10 GM/15ML solution Take 10 g by mouth daily as needed for mild constipation.     levocetirizine (XYZAL) 5 MG tablet TAKE 1 TABLET BY MOUTH EVERY DAY AS NEEDED 30 tablet 0   medroxyPROGESTERone (DEPO-PROVERA) 150 MG/ML injection Inject 1 mL (150 mg total) into the muscle every 3 (three) months. 1 mL  3   metoprolol succinate (TOPROL XL) 25 MG 24 hr tablet Take 1 tablet (25 mg total) by mouth in the morning and at bedtime. 180 tablet 3   metroNIDAZOLE (METROGEL) 0.75 % vaginal gel Place 1 Applicatorful vaginally at bedtime. Apply one applicatorful to vagina at bedtime for 10 days, then twice a week for 6 months. 70 g 5   Multiple Vitamin (MULTIVITAMIN WITH  MINERALS) TABS tablet Take 1 tablet by mouth daily.     mupirocin ointment (BACTROBAN) 2 % 1 application by Tracheal Tube route daily. Applies to trach-stoma site     nystatin (MYCOSTATIN) powder Apply 1 application topically 2 (two) times daily as needed (skin irritation). Small amount . Rash     polyethylene glycol powder (MIRALAX) 17 GM/SCOOP powder Start taking 1 capful 3 times a day. Slowly cut back as needed until you have normal bowel movements. 255 g 0   senna (SENOKOT) 8.6 MG tablet Take 1 tablet by mouth every other day.     sertraline (ZOLOFT) 100 MG tablet Take 100 mg by mouth daily.     solifenacin (VESICARE) 10 MG tablet Take 10 mg by mouth daily.     sucralfate (CARAFATE) 1 GM/10ML suspension Take 1 g by mouth 3 (three) times daily.      sulfamethoxazole-trimethoprim (BACTRIM DS) 800-160 MG tablet Take 1 tablet by mouth 2 (two) times daily. 20 tablet 0   topiramate (TOPAMAX) 50 MG tablet Take 1 tablet (50 mg total) by mouth at bedtime. 90 tablet 3   Wheat Dextrin (BENEFIBER) CHEW Benefiber Sugar Free (dextrin) 1 gram chewable tablet  Take 1 tablet by oral route for 90 days.     diphenhydrAMINE (BENADRYL) 25 MG tablet Take 1 tablet (25 mg total) by mouth every 6 (six) hours as needed for itching or allergies. Take with Levofloxacin to prevent allergic reaction 30 tablet 0   No current facility-administered medications on file prior to visit.    Allergies as of 06/30/2022 - Review Complete 06/30/2022  Allergen Reaction Noted   Bee venom Shortness Of Breath, Swelling, and Anaphylaxis 08/09/2006   Amoxicillin Hives and Itching  07/26/2016   Ciprofloxacin Hives and Rash 08/09/2006   Tobramycin Hives and Rash 08/09/2006   Erythromycin Other (See Comments) 04/09/2012   Trazodone Other (See Comments) 08/20/2011   Trazodone and nefazodone  04/09/2012   Morphine and related Other (See Comments) 10/11/2012   Vantin [cefpodoxime] Palpitations 11/01/2020     ROS:   General:  No weight loss, Fever, chills  HEENT: No recent headaches, no nasal bleeding, no visual changes, no sore throat  Neurologic: No dizziness, blackouts, seizures. No recent symptoms of stroke or mini- stroke. No recent episodes of slurred speech, or temporary blindness.  Cardiac: No recent episodes of chest pain/pressure, no shortness of breath at rest.  No shortness of breath with exertion.  Denies history of atrial fibrillation or irregular heartbeat  Vascular: No history of rest pain in feet.  No history of claudication.  No history of non-healing ulcer, No history of DVT   Pulmonary: No home oxygen, no productive cough, no hemoptysis,  No asthma or wheezing  Musculoskeletal:  [ ]  Arthritis, [ ]  Low back pain,  [ ]  Joint pain  Hematologic:No history of hypercoagulable state.  No history of easy bleeding.  No history of anemia  Gastrointestinal: No hematochezia or melena,  No gastroesophageal reflux, no trouble swallowing  Urinary: [ ]  chronic Kidney disease, [ ]  on HD - [ ]  MWF or [ ]  TTHS, [ ]  Burning with urination, [ ]  Frequent urination, [ ]  Difficulty urinating;   Skin: No rashes  Psychological: No history of anxiety,  No history of depression  Physical Examination  Vitals:   06/30/22 1316  BP: 103/67  Pulse: 85  Resp: 18  Temp: 98.4 F (36.9 C)  TempSrc: Temporal  Weight: 130 lb (59 kg)  Height: 5\' 2"  (1.575  m)    Body mass index is 23.78 kg/m.  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  Mild compression was ordered to be worn daily to prevent edema.  Elevation and skin check for ulcers.  She has palpable pedal pulses and no history of ulcers.   She is not at risk of limb loss.  F/U PRN.     Mosetta Pigeon PA-C [facility] Office: [phone]  MD on call Karin Lieu

## 2022-07-08 ENCOUNTER — Encounter: Payer: Self-pay | Admitting: Family Medicine

## 2022-07-10 ENCOUNTER — Other Ambulatory Visit: Payer: Self-pay | Admitting: Cardiology

## 2022-07-19 ENCOUNTER — Encounter: Payer: Self-pay | Admitting: Infectious Diseases

## 2022-07-19 ENCOUNTER — Other Ambulatory Visit: Payer: Self-pay

## 2022-07-19 ENCOUNTER — Ambulatory Visit (INDEPENDENT_AMBULATORY_CARE_PROVIDER_SITE_OTHER): Payer: Medicare Other | Admitting: Infectious Diseases

## 2022-07-19 VITALS — BP 110/69 | HR 96 | Resp 17 | Ht 62.0 in | Wt 130.0 lb

## 2022-07-19 DIAGNOSIS — Z881 Allergy status to other antibiotic agents status: Secondary | ICD-10-CM | POA: Diagnosis not present

## 2022-07-19 DIAGNOSIS — B999 Unspecified infectious disease: Secondary | ICD-10-CM

## 2022-07-19 DIAGNOSIS — Z2239 Carrier of other specified bacterial diseases: Secondary | ICD-10-CM | POA: Diagnosis not present

## 2022-07-19 DIAGNOSIS — Z880 Allergy status to penicillin: Secondary | ICD-10-CM

## 2022-07-19 NOTE — Progress Notes (Incomplete)
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 360 mL 5   albuterol (VENTOLIN HFA) 108 (90 Base) MCG/ACT inhaler Inhale into the lungs.     baclofen (LIORESAL) 20 MG tablet TAKE TWO TABLETS BY MOUTH FOUR TIMES A DAY 720 tablet 0   busPIRone (BUSPAR) 5 MG tablet Take 5 mg by mouth 3 (three) times daily.     cephALEXin (KEFLEX) 250 MG capsule Take 250 mg by mouth daily.     clotrimazole-betamethasone (LOTRISONE) cream Apply 1 application topically daily as needed (for irritation). On labium     dantrolene (DANTRIUM) 100 MG capsule TAKE 1 CAPSULE BY MOUTH 3 TIMES DAILY. 90 capsule 5   Docusate Sodium (DSS) 100 MG CAPS Take 100 mg by mouth 2 (two) times daily with a meal.     EPIPEN 2-PAK 0.3 MG/0.3ML SOAJ injection Inject 0.3 mg into the muscle daily as needed for anaphylaxis.  1   fluticasone (FLONASE) 50 MCG/ACT nasal spray INSERT 2 SPRAYS IN EACH NOSTRIL EVERY DAY FOR STUFFY NOSE OR DRAINAGE 16 g 5   gabapentin (NEURONTIN) 300 MG capsule Take 300 mg by mouth 3 (three) times daily.     hydrocortisone cream 0.5 % Apply 1 application topically 2  (two) times daily. To hairline and brows     ibuprofen (ADVIL) 200 MG tablet Take 400 mg by mouth every 6 (six) hours as needed for fever, headache, mild pain, moderate pain or cramping.     imipramine (TOFRANIL) 50 MG tablet TAKE 2 TABLETS (100 MG TOTAL) BY MOUTH AT BEDTIME. 60 tablet 11   ketorolac (ACULAR) 0.5 % ophthalmic solution Place 1 drop into both eyes daily.      Lactobacillus (ACIDOPHILUS PROBIOTIC) 10 MG CAPS Take 1 tablet by mouth daily. 30 capsule 3   lactulose (CHRONULAC) 10 GM/15ML solution Take 10 g by mouth daily as needed for mild constipation.     levocetirizine (XYZAL) 5 MG tablet TAKE 1 TABLET BY MOUTH EVERY DAY AS NEEDED 30 tablet 0   medroxyPROGESTERone (DEPO-PROVERA) 150 MG/ML injection Inject 1 mL (150 mg total) into the muscle every 3 (three) months. 1 mL 3   METAMUCIL FIBER PO Take by mouth.     metoprolol succinate (TOPROL-XL) 25 MG 24 hr tablet Take 1 tablet (25 mg total) by mouth in the morning and at bedtime. 60 tablet 0   metroNIDAZOLE (METROGEL) 0.75 % vaginal gel Place 1 Applicatorful vaginally at bedtime. Apply one applicatorful to vagina at bedtime for 10 days, then twice a week for 6 months. 70 g 5   Multiple Vitamin (MULTIVITAMIN WITH MINERALS) TABS tablet Take 1 tablet by mouth daily.     mupirocin ointment (BACTROBAN) 2 %  1 application by Tracheal Tube route daily. Applies to trach-stoma site     nystatin (MYCOSTATIN) powder Apply 1 application topically 2 (two) times daily as needed (skin irritation). Small amount . Rash     polyethylene glycol powder (MIRALAX) 17 GM/SCOOP powder Start taking 1 capful 3 times a day. Slowly cut back as needed until you have normal bowel movements. 255 g 0   senna (SENOKOT) 8.6 MG tablet Take 1 tablet by mouth every other day.     sertraline (ZOLOFT) 100 MG tablet Take 100 mg by mouth daily.     solifenacin (VESICARE) 10 MG tablet Take 10 mg by mouth daily.     sucralfate (CARAFATE) 1 GM/10ML suspension Take 1 g by mouth 3  (three) times daily.      topiramate (TOPAMAX) 50 MG tablet Take 1 tablet (50 mg total) by mouth at bedtime. 90 tablet 3   Wheat Dextrin (BENEFIBER) CHEW Benefiber Sugar Free (dextrin) 1 gram chewable tablet  Take 1 tablet by oral route for 90 days.     diphenhydrAMINE (BENADRYL) 25 MG tablet Take 1 tablet (25 mg total) by mouth every 6 (six) hours as needed for itching or allergies. Take with Levofloxacin to prevent allergic reaction 30 tablet 0   No current facility-administered medications on file prior to visit.    Subjective: 36 Y O female with pmh of brain stem CVA w spastic tetraplegia w neurogenic bladder, anxiety/depression, OSA, recurrent UTI who is referred from pulmonary due to concerns for recurrent respiratory infection. She is accompanied by her [place].   Per patient, she had a sputum cx approx a month ago at [facility] which was positive for PsA and was treated with a course of PO abtx. She has completed course of cephalexin and bactrim each this year, but has not needed to be hospitalized for PNA. She mostly has scratchy throat and coughs sometimes with clear phlegm. Phlegm changes to yellw color when she has lung infection.  She denies fever but has chills sometimes. Denies sweats. Denies chest pain and SOB. She is able to talk in full sentences without any difficulty and no cough on my encounter today. Denies nausea. vomiting, abdominal pain and diarrhea. She has a [place] helping her 16 hrs a day. He boyfriend also helps her at home. She has no new symptoms whatsoever today.   Seen by Pulmonary Dr Halford Chessman 7/19 at which time she was given a course of PO bactrim given her most recent sputum cultures had grown PsA and Stenotrophomonas ( I do not see the sputum culture results)  She has an allergy with Penicillin, reported as hives long time ago. She does not know more details. She has stomach upset when she took amoxicillin in the past. She had hives with Ciprofloxacin when she  took it at [age] years. She does not know any face/lip swelling or SOB with penicillin or ciprofloxacin  Review of Systems: all systems reviewed with pertinent positives and negatives as listed above  Past Medical History:  Diagnosis Date   Anxiety    CVA (cerebral infarction)    Ischemic brainstem stroke in 2011 at age 48 due to unknown reasons--quadriplegic after stroke.   Depression    Zoloft has helped-not having any current depression   Edema extremities    Feet and legs-mostly in feet   Headache(784.0)    Prior to imipramine-none currently   Pneumonia    Quadriplegia ([place])    Secondary to stroke at age  14-has trach-lives with boyfriend-motorized w/c-joystick to chin to drive w/c--I & O cath thru suprapubic stoma - has nursing care 16 hrs daily and boyfriend able to help pt the other hours of day.   Recurrent UTI (urinary tract infection)    Due to suprapubic stoma, repetitive catheterizations   Sleep apnea    On ventilator at night -has oxygen but rarely needs unless she has a cold and sats drop   Stroke [facility])    Past Surgical History:  Procedure Laterality Date   BLADDER SURGERY  2009,2012   BRONCHOSCOPY  2006   CHOLECYSTECTOMY  11/18/2012   Procedure: LAPAROSCOPIC CHOLECYSTECTOMY;  Surgeon: Ralene Ok, MD;  Location: [facility];  Service: General;  Laterality: N/A;   ESOPHAGOGASTRODUODENOSCOPY     GASTROSTOMY TUBE PLACEMENT  2000   ILEOSTOMY  2006   MICROLARYNGOSCOPY WITH LASER AND BALLOON DILATION N/A 09/27/2016   Procedure: MICROLARYNGOSCOPY WITH LASER AND BALLOON DILATION;  Surgeon: Melida Quitter, MD;  Location: [facility];  Service: ENT;  Laterality: N/A;  micro direct laryngoscopy with co2 laser balloon dilation of trachea   REMOVAL OF GASTROSTOMY TUBE     SPINE SURGERY  2004,2011   TENDON REPAIR  2012   Left wrist   THROAT SURGERY  2004   TRACHEOSTOMY  2000   wirst surgery Left    WISDOM TOOTH EXTRACTION  2008     Social History   Tobacco Use   Smoking status:  Never   Smokeless tobacco: Never  Vaping Use   Vaping Use: Never used  Substance Use Topics   Alcohol use: No    Alcohol/week: 0.0 standard drinks of alcohol   Drug use: No    Types: Marijuana    Family History  Problem Relation Age of Onset   Diabetes Mother    Hypertension Mother    Varicose Veins Mother    Cancer Maternal Aunt        lung   Diabetes Maternal Grandfather    Heart disease Maternal Grandfather    Heart disease Paternal Grandfather    Other Sister        overdose    Allergies  Allergen Reactions   Bee Venom Shortness Of Breath, Swelling and Anaphylaxis    SHOCK   Amoxicillin Hives and Itching    Tolerates Keflex, Rocephin Has patient had a PCN reaction causing immediate rash, facial/tongue/throat swelling, SOB or lightheadedness with hypotension: no Has patient had a PCN reaction causing severe rash involving mucus membranes or skin necrosis: no Has patient had a PCN reaction that required hospitalization: no Has patient had a PCN reaction occurring within the last 10 years: unknown If all of the above answers are "NO", then may proceed with Cephalosporin use.    Ciprofloxacin Hives and Rash   Tobramycin Hives and Rash   Erythromycin Other (See Comments)    UNSPECIFIED REACTION     Trazodone Other (See Comments)    UNSPECIFIED REACTION  Eyes shake   Trazodone And Nefazodone     UNSPECIFIED REACTION    Morphine And Related Other (See Comments)    "GOES CRAZY"    Vantin [Cefpodoxime] Palpitations    Health Maintenance  Topic Date Due   Hepatitis C Screening  Never done   COVID-19 Vaccine (5 - Moderna risk series) 10/24/2021   INFLUENZA VACCINE  06/20/2022   PAP SMEAR-Modifier  10/19/2022   TETANUS/TDAP  10/05/2029   HIV Screening  Completed   HPV VACCINES  Aged Out    Objective: BP  110/69   Pulse 96   Resp 17   Ht 5\' 2"  (1.575 m)   Wt 130 lb (59 kg)   SpO2 96%   BMI 23.78 kg/m    Physical Exam Constitutional:      Appearance:  young female sitting in a wheel chair, has a ace shield  HENT:     Head: Normocephalic and atraumatic.      Mouth: Mucous membranes are moist. Has a speaking valve in neck  Eyes:    Conjunctiva/sclera: Conjunctivae normal.     Pupils:   Cardiovascular:     Rate and Rhythm: Normal rate and regular rhythm.     Heart sounds:  Pulmonary:     Effort: Pulmonary effort is normal on room air    Breath sounds: Normal breath sounds.   Abdominal:     General: Non distended     Palpations: soft.   Musculoskeletal:        General:spastic quadriplegia , contracted extremities   Skin:    General: Skin is warm and dry.     Comments: supra pubic stoma - OK  Neurological:  awake, alert and oriented to person, place, and time.   Psychiatric:        Mood and Affect: Mood normal.   Lab Results Lab Results  Component Value Date   WBC 4.1 06/15/2022   HGB 13.5 06/15/2022   HCT 40.6 06/15/2022   MCV 89.8 06/15/2022   PLT 144 (L) 06/15/2022    Lab Results  Component Value Date   CREATININE 0.30 (L) 06/15/2022   BUN 6 06/15/2022   NA 137 06/15/2022   K 3.5 06/15/2022   CL 107 06/15/2022   CO2 24 06/15/2022    Lab Results  Component Value Date   ALT 14 06/15/2022   AST 16 06/15/2022   ALKPHOS 55 06/15/2022   BILITOT 0.3 06/15/2022    No results found for: "CHOL", "HDL", "LDLCALC", "LDLDIRECT", "TRIG", "CHOLHDL" No results found for: "LABRPR", "RPRTITER" No results found for: "HIV1RNAQUANT", "HIV1RNAVL", "CD4TABS"   Microbiology Results for orders placed or performed in visit on 09/21/21  Respiratory or Resp and Sputum Culture     Status: Abnormal   Collection Time: 09/21/21  4:29 PM   Specimen: Sputum  Result Value Ref Range Status   MICRO NUMBER: 13/02/22  Final   SPECIMEN QUALITY: Adequate  Final   Source SPUTUM  Final   STATUS: FINAL  Final   GRAM STAIN: Gram positive cocci in chains (A)  Final    Comment: Many White blood cells seen Rare epithelial cells Many Gram  negative bacilli Moderate Gram positive cocci in chains Moderate Gram positive cocci in clusters   ISOLATE 1: Pseudomonas aeruginosa (A)  Final    Comment: Heavy growth of Pseudomonas aeruginosa      Susceptibility   Pseudomonas aeruginosa - RESPIRATORY/SPUTUM CULT NEGATIVE 1    CEFTAZIDIME 2 Sensitive     CEFEPIME <=1 Sensitive     CIPROFLOXACIN <=0.25 Sensitive     LEVOFLOXACIN <=0.12 Sensitive     GENTAMICIN <=1 Sensitive     IMIPENEM 1 Sensitive     PIP/TAZO 8 Sensitive     TOBRAMYCIN* <=1 Sensitive      * Legend: S = Susceptible  I = Intermediate R = Resistant  NS = Not susceptible * = Not tested  NR = Not reported **NN = See antimicrobic comments    Radiology 06/15/22 IMPRESSION: 1. No evidence of bowel obstruction. Evidence of prior colonic  surgery. Moderate volume stool throughout the colon suggest constipation. 2. A stoma connecting the bladder to the RIGHT abdominal wall without complication. Depending stones within the bladder unchanged from prior. No hydronephrosis or obstructive uropathy. 3. Postcholecystectomy. 4. Stable decubitus thickening posterior to the sacrum. No acute findings.   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 anxiety/depression, OSA, recurrent UTI who is referred from pulmonary due to concerns for recurrent respiratory infection and allergy to multiple antibiotics.  She has no respiratory symptoms whatsoever today. Per Pulm, she had recent sputum cx  positive for stenotrophomonas and PsA which was treated with course of PO bactrim with improvement in symptoms. Will request those sputum cultures for record. No indication for abtx given no symptoms   Will refer to allergy to sort out allergy to Penicillin and ciprofloxacin. She is agreeable.  Fu as needed or with worsening of symptoms   I have personally spent 65 minutes involved in face-to-face and non-face-to-face activities for this  patient on the day of the visit. Professional time spent includes the following activities: Preparing to see the patient (review of tests), Obtaining and/or reviewing separately obtained history (admission/discharge record), Performing a medically appropriate examination and/or evaluation , Ordering medications/tests/procedures, referring and communicating with other health care professionals, Documenting clinical information in the EMR, Independently interpreting results (not separately reported), Communicating results to the patient/family/caregiver, Counseling and educating the patient/family/caregiver and Care coordination (not separately reported).   Victoriano Lain, MD [facility] 07/19/2022, 12:21 PM

## 2022-07-20 DIAGNOSIS — Z881 Allergy status to other antibiotic agents status: Secondary | ICD-10-CM | POA: Insufficient documentation

## 2022-07-20 DIAGNOSIS — Z2239 Carrier of other specified bacterial diseases: Secondary | ICD-10-CM | POA: Insufficient documentation

## 2022-07-20 DIAGNOSIS — B999 Unspecified infectious disease: Secondary | ICD-10-CM | POA: Insufficient documentation

## 2022-07-20 DIAGNOSIS — Z880 Allergy status to penicillin: Secondary | ICD-10-CM | POA: Insufficient documentation

## 2022-07-31 ENCOUNTER — Telehealth: Payer: Self-pay | Admitting: Neurology

## 2022-07-31 NOTE — Telephone Encounter (Signed)
Pt is wanting to restart Botox, please call

## 2022-07-31 NOTE — Telephone Encounter (Signed)
Will discuss with Maralyn Sago when she returns to the office tomorrow.

## 2022-08-01 NOTE — Telephone Encounter (Signed)
Last injection was September 2019 used to Botox 100x4, it is okay to reinitiate preauthorization, let her come back for injection afterwards

## 2022-08-01 NOTE — Telephone Encounter (Signed)
Please see message about botox reauth. Thanks!

## 2022-08-02 ENCOUNTER — Telehealth (HOSPITAL_COMMUNITY): Payer: Self-pay | Admitting: Pharmacy Technician

## 2022-08-02 ENCOUNTER — Other Ambulatory Visit (HOSPITAL_COMMUNITY): Payer: Self-pay

## 2022-08-02 ENCOUNTER — Other Ambulatory Visit: Payer: Self-pay | Admitting: Neurology

## 2022-08-02 NOTE — Telephone Encounter (Signed)
Patient Advocate Encounter   Received notification that prior authorization for Botox 100UNIT solution is required.   PA submitted on 08/02/2022 Key [id number] Status is pending       Roland Earl, CPhT Pharmacy Patient Advocate Specialist [facility] Patient Advocate Team Direct Number: [phone]  Fax: [phone]

## 2022-08-07 NOTE — Telephone Encounter (Signed)
Submitted to [employer] to review medical benefit coverage- Medicare and Medicaid

## 2022-08-17 ENCOUNTER — Other Ambulatory Visit: Payer: Self-pay | Admitting: Cardiology

## 2022-08-18 ENCOUNTER — Other Ambulatory Visit (HOSPITAL_COMMUNITY): Payer: Self-pay

## 2022-08-18 ENCOUNTER — Telehealth (HOSPITAL_COMMUNITY): Payer: Self-pay | Admitting: Pharmacy Technician

## 2022-08-18 NOTE — Telephone Encounter (Signed)
Patient Advocate Encounter   Received notification that prior authorization for Xeomin 100UNIT solution is required.   PA submitted on 08/18/2022 Key [id number] Status is pending       Lyndel Safe, [place] Patient Advocate Specialist [place] Patient Advocate Team Direct Number: [phone]  Fax: [phone]

## 2022-08-18 NOTE — Telephone Encounter (Signed)
Patient Advocate Encounter  Prior Authorization for Xeomin 100UNIT solution has been approved.    PA# [id number] Effective dates: 08/18/2022 through Further notice      Lyndel Safe, [place] Patient Advocate Specialist [place] Patient Advocate Team Direct Number: [phone]  Fax: [phone]

## 2022-08-18 NOTE — Telephone Encounter (Signed)
Patient Advocate Encounter  Received notification that the request for prior authorization for Botox 100UNIT solution has been denied.  Resubmitted for Xeomin and was approved.  Lyndel Safe, [place] Patient Advocate Specialist [place] Patient Advocate Team Direct Number: [phone]  Fax: [phone]

## 2022-09-05 ENCOUNTER — Telehealth: Payer: Self-pay | Admitting: Pulmonary Disease

## 2022-09-05 NOTE — Telephone Encounter (Signed)
When Dr Halford Chessman returns to [facility] office on 10/23 we will make sure that he gets paperwork for this patient. And fax back to the appropriate parties. Nothing further needed

## 2022-09-11 ENCOUNTER — Other Ambulatory Visit (HOSPITAL_COMMUNITY): Payer: Self-pay

## 2022-09-11 MED ORDER — BOTOX 200 UNITS IJ SOLR
400.0000 [IU] | Freq: Once | INTRAMUSCULAR | 1 refills | Status: AC
  Filled 2022-09-11: qty 1, fill #0

## 2022-09-11 NOTE — Telephone Encounter (Signed)
Rx for 400 units has been sent to [facility] for processing.

## 2022-09-11 NOTE — Addendum Note (Signed)
Addended by: Verlin Grills on: 09/11/2022 08:04 AM   Modules accepted: Orders

## 2022-09-12 NOTE — Telephone Encounter (Signed)
Received signed forms back from Dr. Halford Chessman. I have faxed the form to [facility] and received a confirmation fax.

## 2022-09-13 ENCOUNTER — Ambulatory Visit (INDEPENDENT_AMBULATORY_CARE_PROVIDER_SITE_OTHER): Payer: Medicare Other

## 2022-09-13 ENCOUNTER — Other Ambulatory Visit (HOSPITAL_COMMUNITY): Payer: Self-pay

## 2022-09-13 ENCOUNTER — Encounter: Payer: Self-pay | Admitting: Pulmonary Disease

## 2022-09-13 ENCOUNTER — Ambulatory Visit (INDEPENDENT_AMBULATORY_CARE_PROVIDER_SITE_OTHER): Payer: Medicare Other | Admitting: Pulmonary Disease

## 2022-09-13 VITALS — BP 118/68 | HR 88 | Ht 62.0 in | Wt 132.0 lb

## 2022-09-13 DIAGNOSIS — J9819 Other pulmonary collapse: Secondary | ICD-10-CM

## 2022-09-13 DIAGNOSIS — J22 Unspecified acute lower respiratory infection: Secondary | ICD-10-CM | POA: Diagnosis not present

## 2022-09-13 DIAGNOSIS — J9611 Chronic respiratory failure with hypoxia: Secondary | ICD-10-CM | POA: Diagnosis not present

## 2022-09-13 DIAGNOSIS — G4730 Sleep apnea, unspecified: Secondary | ICD-10-CM

## 2022-09-13 DIAGNOSIS — Z930 Tracheostomy status: Secondary | ICD-10-CM

## 2022-09-13 NOTE — Progress Notes (Signed)
[facility]  Chief Complaint  Patient presents with   Follow-up    Seen infectious disease specialist she has been referred to Allergist next week.    Past Surgical History:  She  has a past surgical history that includes Ileostomy (2006); Esophagogastroduodenoscopy; Spine surgery (0998,3382); Tracheostomy (2000); Gastrostomy tube placement (2000); Bronchoscopy (2006); Throat surgery (2004); Wisdom tooth extraction (2008); Bladder surgery (5053,9767); Tendon repair (2012); Removal of gastrostomy tube; Cholecystectomy (11/18/2012); wirst surgery (Left); and Microlaryngoscopy with laser and balloon dilation (N/A, 09/27/2016).  Past Medical History:  CVA with brain stem involvement, Quadriplegia, Depression, Neurogenic bladder, Recurrent UTI, GERD, Scoliosis  Constitutional:  BP 118/68 (BP Location: Right Arm, Patient Position: Sitting, Cuff Size: Normal)   Pulse 88   Ht '5\' 2"'  (1.575 m)   Wt 132 lb (59.9 kg)   SpO2 95%   BMI 24.14 kg/m   Brief Summary:  Veronica Huff is a 36 y.o. female withchronic respiratory failure and sleep disordered breathing 2nd to brain stem infarct at age 25 with resulting quadriplegia.  She has chronic tracheostomy with nocturnal ventilator support.      Subjective:   She is here with her aide.  She was seen by ID in August.  Has upcoming appointment with immunologist to determine degree of allergy to penicillin and quinolone antibiotics.   She is not having cough, wheeze, or sputum at present.  No ever.  Had her trach changed earlier this month.   Physical Exam:   Appearance - sitting in motorized wheelchair  ENMT - no sinus tenderness, no oral exudate, no LAN, Mallampati 2 airway, no stridor, trach site clean  Respiratory - equal breath sounds bilaterally, no wheezing or rales  CV - s1s2 regular rate and rhythm, no murmurs  Ext - no clubbing, no edema  Skin - no rashes  Psych - normal mood and  affect     Sleep Tests:  ONO with home vent 10/12/16 >> test time 3 hrs 51 min.  Mean SpO2 92.9%, low SpO2 74%.  Spent 14 min with SpO2 < 88%.  Social History:  She  reports that she has never smoked. She has never used smokeless tobacco. She reports that she does not drink alcohol and does not use drugs.  Family History:  Her family history includes Cancer in her maternal aunt; Diabetes in her maternal grandfather and mother; Heart disease in her maternal grandfather and paternal grandfather; Hypertension in her mother; Other in her sister; Varicose Veins in her mother.     Assessment/Plan:   Recurrent lower respiratory infections. - most recent sputum culture growing Pseudomonas and Stenotrophomonas - she has multiple antibiotic allergies; she appointment with immunologist in November 2023 to assess severity of antibiotic allergies - seen by Dr. Rosiland Oz with ID in August 2023; follow up if she has recurrent resistant respiratory infection  Sleep disordered breathing with hx of obstructive sleep apnea. Chronic hypoxic respiratory failure. Lung collapse. - continue Astral home vent SIMV rate 7, Vt 350, PS 10, PEEP 5, I time 1, High pressure alarm 45 - gets supplies from [facility] - she might need more home nursing care if her respiratory infections starting impacting her respiratory status more - prn albuterol - will arrange for nebulizer kit - chest xray today to assess degree of lung collapse; this might allow her to get extra home health support   Tracheostomy status. - followed by Dr. 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 - followed by Dr. Marcial Pacas with [facility]   Neurogenic bladder. - followed by Urology at [facility]  Time Spent Involved in Patient Care on Day of Examination:  26 minutes  Follow up:   Patient Instructions  Chest xray  today  Follow up in 4 months  Medication List:   Allergies as of 09/13/2022       Reactions   Bee Venom Shortness Of Breath, Swelling, Anaphylaxis   SHOCK   Amoxicillin Hives, Itching   Tolerates Keflex, Rocephin Has patient had a PCN reaction causing immediate rash, facial/tongue/throat swelling, SOB or lightheadedness with hypotension: no Has patient had a PCN reaction causing severe rash involving mucus membranes or skin necrosis: no Has patient had a PCN reaction that required hospitalization: no Has patient had a PCN reaction occurring within the last 10 years: unknown If all of the above answers are "NO", then may proceed with Cephalosporin use.   Ciprofloxacin Hives, Rash   Tobramycin Hives, Rash   Erythromycin Other (See Comments)   UNSPECIFIED REACTION    Trazodone Other (See Comments)   UNSPECIFIED REACTION  Eyes shake   Trazodone And Nefazodone    UNSPECIFIED REACTION    Morphine And Related Other (See Comments)   "GOES CRAZY"   Vantin [cefpodoxime] Palpitations        Medication List        Accurate as of September 13, 2022  1:16 PM. If you have any questions, ask your nurse or doctor.          Acidophilus Probiotic 10 MG Caps Take 1 tablet by mouth daily.   albuterol 108 (90 Base) MCG/ACT inhaler Commonly known as: VENTOLIN HFA Inhale into the lungs.   albuterol (2.5 MG/3ML) 0.083% nebulizer solution Commonly known as: PROVENTIL Take 3 mLs (2.5 mg total) by nebulization every 6 (six) hours as needed for wheezing or shortness of breath.   baclofen 20 MG tablet Commonly known as: LIORESAL TAKE TWO TABLETS BY MOUTH FOUR TIMES A DAY   Benefiber Chew Benefiber Sugar Free (dextrin) 1 gram chewable tablet  Take 1 tablet by oral route for 90 days.   busPIRone 5 MG tablet Commonly known as: BUSPAR Take 5 mg by mouth 3 (three) times daily.   cephALEXin 250 MG capsule Commonly known as: KEFLEX Take 250 mg by mouth daily.    clotrimazole-betamethasone cream Commonly known as: LOTRISONE Apply 1 application topically daily as needed (for irritation). On labium   dantrolene 100 MG capsule Commonly known as: DANTRIUM TAKE 1 CAPSULE BY MOUTH 3 TIMES DAILY.   diphenhydrAMINE 25 MG tablet Commonly known as: BENADRYL Take 1 tablet (25 mg total) by mouth every 6 (six) hours as needed for itching or allergies. Take with Levofloxacin to prevent allergic reaction   DSS 100 MG Caps Take 100 mg by mouth 2 (two) times daily with a meal.   EpiPen 2-Pak 0.3 mg/0.3 mL Soaj injection Generic drug: EPINEPHrine Inject 0.3 mg into the muscle daily as needed for anaphylaxis.   fluticasone 50 MCG/ACT nasal spray Commonly known as: FLONASE INSERT 2 SPRAYS IN EACH NOSTRIL EVERY DAY FOR STUFFY NOSE OR DRAINAGE   gabapentin 300 MG capsule Commonly known as: NEURONTIN Take 300 mg by mouth 3 (three) times daily.   hydrocortisone cream 0.5 % Apply 1 application topically 2 (two) times daily. To hairline and brows   ibuprofen 200 MG tablet Commonly  known as: ADVIL Take 400 mg by mouth every 6 (six) hours as needed for fever, headache, mild pain, moderate pain or cramping.   imipramine 50 MG tablet Commonly known as: TOFRANIL TAKE 2 TABLETS (100 MG TOTAL) BY MOUTH AT BEDTIME.   ketorolac 0.5 % ophthalmic solution Commonly known as: ACULAR Place 1 drop into both eyes daily.   lactulose 10 GM/15ML solution Commonly known as: CHRONULAC Take 10 g by mouth daily as needed for mild constipation.   levocetirizine 5 MG tablet Commonly known as: XYZAL TAKE 1 TABLET BY MOUTH EVERY DAY AS NEEDED   medroxyPROGESTERone 150 MG/ML injection Commonly known as: DEPO-PROVERA Inject 1 mL (150 mg total) into the muscle every 3 (three) months.   METAMUCIL FIBER PO Take by mouth.   metoprolol succinate 25 MG 24 hr tablet Commonly known as: TOPROL-XL TAKE ONE TABLET BY MOUTH EVERY MORNING AND AT BEDTIME   metroNIDAZOLE 0.75 %  vaginal gel Commonly known as: METROGEL Place 1 Applicatorful vaginally at bedtime. Apply one applicatorful to vagina at bedtime for 10 days, then twice a week for 6 months.   multivitamin with minerals Tabs tablet Take 1 tablet by mouth daily.   mupirocin ointment 2 % Commonly known as: BACTROBAN 1 application by Tracheal Tube route daily. Applies to trach-stoma site   nystatin powder Commonly known as: MYCOSTATIN/NYSTOP Apply 1 application topically 2 (two) times daily as needed (skin irritation). Small amount . Rash   polyethylene glycol powder 17 GM/SCOOP powder Commonly known as: MiraLax Start taking 1 capful 3 times a day. Slowly cut back as needed until you have normal bowel movements.   senna 8.6 MG tablet Commonly known as: SENOKOT Take 1 tablet by mouth every other day.   sertraline 100 MG tablet Commonly known as: ZOLOFT Take 100 mg by mouth daily.   solifenacin 10 MG tablet Commonly known as: VESICARE Take 10 mg by mouth daily.   sucralfate 1 GM/10ML suspension Commonly known as: CARAFATE Take 1 g by mouth 3 (three) times daily.   topiramate 50 MG tablet Commonly known as: TOPAMAX Take 1 tablet (50 mg total) by mouth at bedtime.        Signature:  Chesley Mires, MD [place] Pager - [phone] 09/13/2022, 1:16 PM

## 2022-09-13 NOTE — Patient Instructions (Signed)
Chest xray today  Follow up in 4 months 

## 2022-09-15 ENCOUNTER — Ambulatory Visit: Payer: Medicare Other | Admitting: Internal Medicine

## 2022-09-16 ENCOUNTER — Encounter: Payer: Self-pay | Admitting: Family Medicine

## 2022-09-19 ENCOUNTER — Other Ambulatory Visit (HOSPITAL_COMMUNITY): Payer: Self-pay

## 2022-09-20 ENCOUNTER — Other Ambulatory Visit (HOSPITAL_COMMUNITY): Payer: Self-pay

## 2022-09-20 MED ORDER — XEOMIN 100 UNITS IM SOLR
400.0000 [IU] | Freq: Once | INTRAMUSCULAR | 0 refills | Status: AC
  Filled 2022-09-20: qty 4, 1d supply, fill #0
  Filled 2022-09-22: qty 4, 90d supply, fill #0

## 2022-09-20 NOTE — Telephone Encounter (Signed)
I returned pharmacy's call and spoke with Anderson Malta. She sts medication is ready to ship but would prefer for pt to have an appt before sending to the clinic. Will have schedulers call pt and set up injections with Dr. Krista Blue and then call pharmacy back to set up shipment.

## 2022-09-20 NOTE — Telephone Encounter (Signed)
Pt called back. Scheduled xeomin injection for 12/6 @ 3pm.

## 2022-09-20 NOTE — Telephone Encounter (Signed)
Rx for xeomin has been sent to [facility] out pt pharmacy. Dr. Krista Blue agreed to trying botox.

## 2022-09-20 NOTE — Telephone Encounter (Signed)
LVM asking pt to call back to schedule xeomin injection

## 2022-09-20 NOTE — Addendum Note (Signed)
Addended by: Verlin Grills on: 09/20/2022 02:55 PM   Modules accepted: Orders

## 2022-09-20 NOTE — Telephone Encounter (Signed)
Tiffany at [facility] is asking for a call back re: pt's Xeomin

## 2022-09-20 NOTE — Telephone Encounter (Signed)
Pt's botox was denied by Xeomin 100x4 was approved through the insurance.  Ok to proceed with Xeomin

## 2022-09-21 ENCOUNTER — Other Ambulatory Visit (HOSPITAL_COMMUNITY): Payer: Self-pay

## 2022-09-21 NOTE — Telephone Encounter (Signed)
I called [facility] pharmacy back and lvm requesting shipment for med to be sent to the clinic. I updated on our clinic hours and location and to call back with any questions.

## 2022-09-22 ENCOUNTER — Other Ambulatory Visit (HOSPITAL_COMMUNITY): Payer: Self-pay

## 2022-09-26 ENCOUNTER — Other Ambulatory Visit: Payer: Self-pay | Admitting: Cardiology

## 2022-09-27 ENCOUNTER — Other Ambulatory Visit (HOSPITAL_COMMUNITY): Payer: Self-pay

## 2022-09-28 ENCOUNTER — Encounter: Payer: Self-pay | Admitting: Cardiology

## 2022-10-02 MED ORDER — METOPROLOL SUCCINATE ER 25 MG PO TB24: ORAL_TABLET | ORAL | 3 refills | Status: DC

## 2022-10-06 ENCOUNTER — Other Ambulatory Visit: Payer: Self-pay | Admitting: Cardiology

## 2022-10-20 ENCOUNTER — Ambulatory Visit: Payer: Medicare Other | Admitting: Internal Medicine

## 2022-10-23 ENCOUNTER — Other Ambulatory Visit (HOSPITAL_COMMUNITY): Payer: Self-pay

## 2022-10-23 ENCOUNTER — Other Ambulatory Visit: Payer: Self-pay | Admitting: Obstetrics and Gynecology

## 2022-10-25 ENCOUNTER — Ambulatory Visit (INDEPENDENT_AMBULATORY_CARE_PROVIDER_SITE_OTHER): Payer: Medicare Other | Admitting: Neurology

## 2022-10-25 ENCOUNTER — Encounter: Payer: Self-pay | Admitting: Neurology

## 2022-10-25 VITALS — BP 116/71 | HR 83 | Ht 62.0 in

## 2022-10-25 DIAGNOSIS — G8250 Quadriplegia, unspecified: Secondary | ICD-10-CM | POA: Diagnosis not present

## 2022-10-25 DIAGNOSIS — I69353 Hemiplegia and hemiparesis following cerebral infarction affecting right non-dominant side: Secondary | ICD-10-CM

## 2022-10-25 DIAGNOSIS — I639 Cerebral infarction, unspecified: Secondary | ICD-10-CM

## 2022-10-25 NOTE — Progress Notes (Signed)
XEOMIN 100 UNITS X 4 vials [id number] Exp-09/25 B/B

## 2022-10-25 NOTE — Progress Notes (Signed)
Chief Complaint  Patient presents with   Procedure    Rm 14. XEOMIN 400 UNITS       ASSESSMENT AND PLAN  Veronica Huff is a 36 y.o. female   Cryptogenic brainstem stroke at age 36, Status post tracheostomy, using ventilator at nighttime, Spastic quadriplegia,   Electrical stimulation guided xeomin injection for bilateral lower extremity spasticity, used 400 units today  Right adductor longus 100 units Right adductor magnus 50 units Right iliopsoas 50 units  Left adductor longus 100 units Left adductor Magness 50 units Left iliopsoas 50 units     DIAGNOSTIC DATA (LABS, IMAGING, TESTING) - I reviewed patient records, labs, notes, testing and imaging myself where available.   MEDICAL HISTORY:  Veronica Huff is a 36 year old female, suffered a brainstem stroke at age 10 years old, with residual spastic quadriplegia, neurogenic bladder.  She is accompanied by her boyfriend, I saw her initially in January 2019 referred by my colleague Dr. Marjory Lies for EMG guided botulinum toxin injection for spastic quadriplegia.   She suffered catastrophic brainstem stroke, cryptogenic at age 71, required intubation, ventilation support, ICU stay, extensive rehabilitation, she regained marked improvement, but become wheelchair-bound with significant quadriplegia,   She also has tracheostomy due to weak cough, she will use ventilator at nighttime, require   suctioning through her tracheostomy.  She has bowel suppositories every other day, was regular bowel regimen, she had a suprapubic catheter.   She lives at home, has caregiver 90 hours each week, her boyfriend moved in with her since 2018, she need total assistance, feeding, dressing.  But she can operate her wheelchair using a mouthpiece, enjoying her computer work and reading.   Previously she got Botox injection through rehabilitation Dr. Riley Kill, most recent injection was in April 2018, for spastic bilateral lower extremity, use the Botox  a.   She complains of bilateral lower extremity with sudden positional change, jackknife contraction of bilateral thigh muscle, uncontrollable muscle jumpy movement.  She does use her upper extremity to gesture when she talks.   MRI of the brain in May 2017, abnormal configuration of the medulla that is related to her previous history of stroke, there is prominence of the CSF space adjacent to the frontal lobe likely represent mild atrophy, no acute abnormality.  She was receiving Botox injection for lower extremity spasticity intermittently since 2019 visit, helping her sometimes but not persistently, had few virtual visit during pandemic  She returns today wants to resume botulism toxin injection for lower extremity spasticity, reporting lower extremity distress, to the point of leading to painful low back spasm, lower extremity uncontrollable jerking movement    PHYSICAL EXAM:   Vitals:   10/25/22 1505  BP: 116/71  Pulse: 83  Height: 5\' 2"  (1.575 m)     Body mass index is 24.14 kg/m.  PHYSICAL EXAMNIATION:  Sitting in wheelchair, operating wheelchair with mouthpiece, awake, alert, soft voice, cupping of bilateral hands, no antigravity movement of bilateral upper or lower extremity, tendency for bilateral leg extension, adduction, spastic jumping movement,  REVIEW OF SYSTEMS:  Full 14 system review of systems performed and notable only for as above All other review of systems were negative.   ALLERGIES: Allergies  Allergen Reactions   Bee Venom Shortness Of Breath, Swelling and Anaphylaxis    SHOCK   Amoxicillin Hives and Itching    Tolerates Keflex, Rocephin Has patient had a PCN reaction causing immediate rash, facial/tongue/throat swelling, SOB or lightheadedness with hypotension: no Has patient had a PCN  reaction causing severe rash involving mucus membranes or skin necrosis: no Has patient had a PCN reaction that required hospitalization: no Has patient had a PCN  reaction occurring within the last 10 years: unknown If all of the above answers are "NO", then may proceed with Cephalosporin use.    Ciprofloxacin Hives and Rash   Tobramycin Hives and Rash   Erythromycin Other (See Comments)    UNSPECIFIED REACTION     Trazodone Other (See Comments)    UNSPECIFIED REACTION  Eyes shake   Trazodone And Nefazodone     UNSPECIFIED REACTION    Morphine And Related Other (See Comments)    "GOES CRAZY"    Vantin [Cefpodoxime] Palpitations    HOME MEDICATIONS: Current Outpatient Medications  Medication Sig Dispense Refill   albuterol (PROVENTIL) (2.5 MG/3ML) 0.083% nebulizer solution Take 3 mLs (2.5 mg total) by nebulization every 6 (six) hours as needed for wheezing or shortness of breath. 360 mL 5   albuterol (VENTOLIN HFA) 108 (90 Base) MCG/ACT inhaler Inhale into the lungs.     baclofen (LIORESAL) 20 MG tablet TAKE TWO TABLETS BY MOUTH FOUR TIMES A DAY 720 tablet 0   busPIRone (BUSPAR) 5 MG tablet Take 5 mg by mouth 3 (three) times daily.     cephALEXin (KEFLEX) 250 MG capsule Take 250 mg by mouth daily.     dantrolene (DANTRIUM) 100 MG capsule TAKE 1 CAPSULE BY MOUTH 3 TIMES DAILY. 90 capsule 5   Docusate Sodium (DSS) 100 MG CAPS Take 100 mg by mouth 2 (two) times daily with a meal.     EPIPEN 2-PAK 0.3 MG/0.3ML SOAJ injection Inject 0.3 mg into the muscle daily as needed for anaphylaxis.  1   fluticasone (FLONASE) 50 MCG/ACT nasal spray INSERT 2 SPRAYS IN EACH NOSTRIL EVERY DAY FOR STUFFY NOSE OR DRAINAGE 16 g 5   gabapentin (NEURONTIN) 300 MG capsule Take 300 mg by mouth 3 (three) times daily.     hydrocortisone cream 0.5 % Apply 1 application topically 2 (two) times daily. To hairline and brows     ibuprofen (ADVIL) 200 MG tablet Take 400 mg by mouth every 6 (six) hours as needed for fever, headache, mild pain, moderate pain or cramping.     imipramine (TOFRANIL) 50 MG tablet TAKE 2 TABLETS (100 MG TOTAL) BY MOUTH AT BEDTIME. 60 tablet 11    ketorolac (ACULAR) 0.5 % ophthalmic solution Place 1 drop into both eyes daily.      lactulose (CHRONULAC) 10 GM/15ML solution Take 10 g by mouth daily as needed for mild constipation.     levocetirizine (XYZAL) 5 MG tablet TAKE 1 TABLET BY MOUTH EVERY DAY AS NEEDED 30 tablet 0   medroxyPROGESTERone (DEPO-PROVERA) 150 MG/ML injection Inject 1 mL (150 mg total) into the muscle every 3 (three) months. 1 mL 3   METAMUCIL FIBER PO Take by mouth.     metoprolol succinate (TOPROL-XL) 25 MG 24 hr tablet Take 1 tablet (25 mg total) by mouth in the morning and at bedtime. 60 tablet 3   Multiple Vitamin (MULTIVITAMIN WITH MINERALS) TABS tablet Take 1 tablet by mouth daily.     mupirocin ointment (BACTROBAN) 2 % 1 application by Tracheal Tube route daily. Applies to trach-stoma site     nystatin (MYCOSTATIN) powder Apply 1 application topically 2 (two) times daily as needed (skin irritation). Small amount . Rash     polyethylene glycol powder (MIRALAX) 17 GM/SCOOP powder Start taking 1 capful 3 times a day.  Slowly cut back as needed until you have normal bowel movements. 255 g 0   psyllium (HYDROCIL/METAMUCIL) 95 % PACK Take 1 packet by mouth daily.     senna (SENOKOT) 8.6 MG tablet Take 1 tablet by mouth every other day.     sertraline (ZOLOFT) 100 MG tablet Take 100 mg by mouth daily.     solifenacin (VESICARE) 10 MG tablet Take 10 mg by mouth daily.     sucralfate (CARAFATE) 1 GM/10ML suspension Take 1 g by mouth 3 (three) times daily.      topiramate (TOPAMAX) 50 MG tablet Take 1 tablet (50 mg total) by mouth at bedtime. 90 tablet 3   diphenhydrAMINE (BENADRYL) 25 MG tablet Take 1 tablet (25 mg total) by mouth every 6 (six) hours as needed for itching or allergies. Take with Levofloxacin to prevent allergic reaction 30 tablet 0   No current facility-administered medications for this visit.    PAST MEDICAL HISTORY: Past Medical History:  Diagnosis Date   Anxiety    CVA (cerebral infarction)     Ischemic brainstem stroke in 2011 at age 55 due to unknown reasons--quadriplegic after stroke.   Depression    Zoloft has helped-not having any current depression   Edema extremities    Feet and legs-mostly in feet   Headache(784.0)    Prior to imipramine-none currently   Pneumonia    Quadriplegia (HCC)    Secondary to stroke at age 77-has trach-lives with boyfriend-motorized w/c-joystick to chin to drive w/c--I & O cath thru suprapubic stoma - has nursing care 16 hrs daily and boyfriend able to help pt the other hours of day.   Recurrent UTI (urinary tract infection)    Due to suprapubic stoma, repetitive catheterizations   Sleep apnea    On ventilator at night -has oxygen but rarely needs unless she has a cold and sats drop   Stroke [facility])     PAST SURGICAL HISTORY: Past Surgical History:  Procedure Laterality Date   BLADDER SURGERY  2009,2012   BRONCHOSCOPY  2006   CHOLECYSTECTOMY  11/18/2012   Procedure: LAPAROSCOPIC CHOLECYSTECTOMY;  Surgeon: Axel Filler, MD;  Location: [facility] ORS;  Service: General;  Laterality: N/A;   ESOPHAGOGASTRODUODENOSCOPY     GASTROSTOMY TUBE PLACEMENT  2000   ILEOSTOMY  2006   MICROLARYNGOSCOPY WITH LASER AND BALLOON DILATION N/A 09/27/2016   Procedure: MICROLARYNGOSCOPY WITH LASER AND BALLOON DILATION;  Surgeon: Christia Reading, MD;  Location: [facility] OR;  Service: ENT;  Laterality: N/A;  micro direct laryngoscopy with co2 laser balloon dilation of trachea   REMOVAL OF GASTROSTOMY TUBE     SPINE SURGERY  2004,2011   TENDON REPAIR  2012   Left wrist   THROAT SURGERY  2004   TRACHEOSTOMY  2000   wirst surgery Left    WISDOM TOOTH EXTRACTION  2008    FAMILY HISTORY: Family History  Problem Relation Age of Onset   Diabetes Mother    Hypertension Mother    Varicose Veins Mother    Cancer Maternal Aunt        lung   Diabetes Maternal Grandfather    Heart disease Maternal Grandfather    Heart disease Paternal Grandfather    Other Sister         overdose    SOCIAL HISTORY: Social History   Socioeconomic History   Marital status: Single    Spouse name: Not on file   Number of children: Not on file   Years of education: college  Highest education level: Not on file  Occupational History   Occupation: disabled  Tobacco Use   Smoking status: Never   Smokeless tobacco: Never  Vaping Use   Vaping Use: Never used  Substance and Sexual Activity   Alcohol use: No    Alcohol/week: 0.0 standard drinks of alcohol   Drug use: No    Types: Marijuana   Sexual activity: Yes    Birth control/protection: None  Other Topics Concern   Not on file  Social History Narrative   08/29/17 Lives at home with boyfriend, Has a Environmental managerN Meredith that helps to care for her    Drinks no caffeine    Social Determinants of Corporate investment bankerHealth   Financial Resource Strain: Not on file  Food Insecurity: Not on file  Transportation Needs: Not on file  Physical Activity: Not on file  Stress: Not on file  Social Connections: Not on file  Intimate Partner Violence: Not on file      Levert FeinsteinYijun Dulcy Sida, M.D. Ph.D.  [facility] [address], [address] [place], [place] [address] Ph: [phone] Fax: [phone]  CC:  Nona DellSchmerge, Michelle B, NP [address] [place] [address]  Schmerge, Belenda CruiseMichelle B, NP

## 2022-10-28 MED ORDER — INCOBOTULINUMTOXINA 100 UNITS IM SOLR
400.0000 [IU] | INTRAMUSCULAR | Status: AC
  Administered 2022-10-28: 400 [IU] via INTRAMUSCULAR

## 2022-10-31 ENCOUNTER — Other Ambulatory Visit: Payer: Self-pay | Admitting: Neurology

## 2022-11-20 HISTORY — PX: BLADDER STONE REMOVAL: SHX568

## 2022-11-23 ENCOUNTER — Other Ambulatory Visit: Payer: Self-pay | Admitting: Neurology

## 2022-11-23 DIAGNOSIS — R519 Headache, unspecified: Secondary | ICD-10-CM

## 2022-12-03 NOTE — Progress Notes (Unsigned)
New Patient Note  RE: Veronica Huff MRN: [id number] DOB: [birth year] Date of Office Visit: 12/04/2022  Consult requested by: Rosiland Oz, MD Primary care provider: Jennette Dubin, NP  Chief Complaint: Other (Allergic to a lot of antibotics.)  History of Present Illness: I had the pleasure of seeing Veronica Huff for initial evaluation at the [facility] on 12/04/2022. She is a 37 y.o. female, who is referred here by Dr. West Bali (ID) for the evaluation of drug allergies. She is accompanied today by her nurse who provided/contributed to the history.   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 Coli in her bladder.  She had issues with pseudomonas for 3 years and had a trach for 24 years. 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 She had E. Coli issues since then due to frequent cathing.   Drug reaction: Penicillin Reaction to penicillin which occurred around age [age].  Patient was being treated for unknown type of infection. Symptoms started after taking unknown amount of days. Symptoms include: hives Symptoms persisted for unknown amount of time.  Treatment included: unsure. Mucosal involvement: no.  Cipro Reaction to cipro which occurred around age [age].  Patient was being treated for unknown type of infection. Symptoms started after taking unknown amount of days. Symptoms include: hives Symptoms persisted for unknown amount of time.  Treatment included: unsure. Mucosal involvement: no.  Tobramycin  Reaction to tobramycin which occurred around age [age].  Patient was being treated for unknown type of infection. Symptoms started after taking unknown amount of days. Symptoms include: hives Symptoms persisted for unknown amount of time.  Treatment included: unsure. Mucosal involvement: no.  Patient has been treated with keflex and bactrim with no  issues.  She believes she took Levaquin in the past with no issues. Currently has at home which she didn't take yet.  Previous work up includes: none. Previous history of rash/hives: no.  Patient follows with pulmonology for her trach, ID for recurrent infections, urology for neurogenic bladder, neurology for quadriplegia, cardiology for palpitations, physical medicine, ENT for trach management.  She used to see GI for chronic nausea which resolved.   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 anxiety/depression, OSA, recurrent UTI who is referred from pulmonary due to concerns for recurrent respiratory infection and allergy to multiple antibiotics.   She has no respiratory symptoms whatsoever today. Per Pulm, she had recent sputum cx  positive for stenotrophomonas and PsA which was treated with course of PO bactrim with improvement in symptoms. Will request those sputum cultures for record. No indication for abtx given no symptoms    Will refer to allergy to sort out allergy to Penicillin and ciprofloxacin. She is agreeable.  Fu as needed or with worsening of symptoms"  Assessment and Plan: Veronica Huff is a 37 y.o. female with: Multiple drug allergies Patient with a complicated medical history (quadriplegic after brain stem stroke at age 53) with multiple antibiotics allergies listed on her allergy list. 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 Reaction in the form of rash/hives to penicillin, cipro and tobramycin. Patient is insure of specific reactions/timeline but does not recall any significant reactions. Tolerates bactrim and is on prophylactic keflex with no issues.  Skin prick and intradermal testing today to  penicillin-G, PrePen were negative which is great. Still need in office drug challenge to penicillin clear off allergy list.  Continue to avoid  medications on your allergy list. Schedule drug challenge to amoxicillin and cipro on 2 separate days. You must be off antihistamines for 3-5 days before. Must be in good health and not ill. No vaccines/injections/antibiotics within the past 7 days. (Okay to take your prophylactic keflex). Plan on being in the office for 2-3 hours and must bring in the drug you want to do the oral challenge for - will send in prescription to pick up a few days before. You must call to schedule an appointment and specify it's for a drug challenge.  Epinephrine injectable device - demonstrated proper use. For mild symptoms you can take over the counter antihistamines such as Benadryl 1-2 tablets = 25-50mg .and monitor symptoms closely. If symptoms worsen or if you have severe symptoms including breathing issues, throat closure, significant swelling, whole body hives, severe diarrhea and vomiting, lightheadedness then inject epinephrine and seek immediate medical care afterwards. Emergency action plan given.  Recurrent infections Keep track of infections and antibiotics use. Get bloodwork to look at immune system at next visit.   Return for Drug challenge.  No orders of the defined types were placed in this encounter.  Lab Orders  No laboratory test(s) ordered today    Other allergy screening: Asthma: no Patient currently on albuterol nebulizer BID after her covid-19 infection.  Rhino conjunctivitis: yes Perennial symptoms and takes Xyzal, Flonase and ketorolac eye drops with good benefit. No recent testing.   Food allergy: no Hymenoptera allergy: Swelling.  Urticaria: no Eczema:no History of recurrent infections suggestive of immunodeficency: yes Respiratory infections and urinary infections.   Diagnostics: Skin Testing:  penicillin . Skin prick and intradermal testing today to penicillin-G, PrePen were negative which is great. Results discussed with patient/family.  Penicillin - 12/04/22 1536      Manufacturer [employer]    Lot # [id number]    Location Arm    Number of allergen test 8    Time Testing Placed 0251    Control SPT Negative    Histamine SPT 2+    Pre-Pen Puncture Negative    Penicillin-G 5000 u/ml SPT Negative    Select Select    Time Testing Placed 0311    Control Intradermal Negative    Pre-Pen Intradermal Negative    Penicillin-G 50 u/ml Intradermal Negative    Time Testing Placed 0331    Penicillin-G 5000 u/ml Intradermal Negative             Past Medical History: Patient Active Problem List   Diagnosis Date Noted   Multiple drug allergies 12/04/2022   Penicillin allergy 12/04/2022   Local reaction to hymenoptera sting 12/04/2022   Recurrent infections 07/20/2022   Allergy history, penicillin 07/20/2022   Allergy to fluoroquinolone 07/20/2022   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   Palpitations 11/10/2020   Dysphagia 11/01/2020   Dysphagia following other cerebrovascular disease 11/01/2020   Spastic tetraplegia with rigidity syndrome (HCC) 12/11/2017   Allergic rhinitis 09/12/2016   Allergic conjunctivitis 09/12/2016   Chronic respiratory failure (HCC) 01/17/2016   Spastic tetraplegia (HCC) 09/29/2015   Chronic daily headache 09/29/2015   Brain stem infarction (HCC) 07/30/2015   Anger reaction 07/30/2015   Generalized anxiety disorder 07/30/2015   Venous (peripheral) insufficiency 12/24/2014   Quadriplegia (HCC)    CVA (cerebral vascular accident) [facility])    Past Medical History:  Diagnosis Date   Anxiety  CVA (cerebral infarction)    Ischemic brainstem stroke in 2011 at age 43 due to unknown reasons--quadriplegic after stroke.   Depression    Zoloft has helped-not having any current depression   Edema extremities    Feet and legs-mostly in feet   Headache(784.0)    Prior to imipramine-none currently   Pneumonia    Quadriplegia (HCC)    Secondary to stroke at age 21-has trach-lives with boyfriend-motorized w/c-joystick to  chin to drive w/c--I & O cath thru suprapubic stoma - has nursing care 16 hrs daily and boyfriend able to help pt the other hours of day.   Recurrent UTI (urinary tract infection)    Due to suprapubic stoma, repetitive catheterizations   Sleep apnea    On ventilator at night -has oxygen but rarely needs unless she has a cold and sats drop   Stroke [facility])    Past Surgical History: Past Surgical History:  Procedure Laterality Date   BLADDER SURGERY  2009,2012   BRONCHOSCOPY  2006   CHOLECYSTECTOMY  11/18/2012   Procedure: LAPAROSCOPIC CHOLECYSTECTOMY;  Surgeon: Axel Filler, MD;  Location: [facility] ORS;  Service: General;  Laterality: N/A;   ESOPHAGOGASTRODUODENOSCOPY     GASTROSTOMY TUBE PLACEMENT  2000   ILEOSTOMY  2006   MICROLARYNGOSCOPY WITH LASER AND BALLOON DILATION N/A 09/27/2016   Procedure: MICROLARYNGOSCOPY WITH LASER AND BALLOON DILATION;  Surgeon: Christia Reading, MD;  Location: [facility] OR;  Service: ENT;  Laterality: N/A;  micro direct laryngoscopy with co2 laser balloon dilation of trachea   REMOVAL OF GASTROSTOMY TUBE     SPINE SURGERY  2004,2011   TENDON REPAIR  2012   Left wrist   THROAT SURGERY  2004   TRACHEOSTOMY  2000   wirst surgery Left    WISDOM TOOTH EXTRACTION  2008   Medication List:  Current Outpatient Medications  Medication Sig Dispense Refill   albuterol (PROVENTIL) (2.5 MG/3ML) 0.083% nebulizer solution Take 3 mLs (2.5 mg total) by nebulization every 6 (six) hours as needed for wheezing or shortness of breath. 360 mL 5   albuterol (VENTOLIN HFA) 108 (90 Base) MCG/ACT inhaler Inhale into the lungs.     baclofen (LIORESAL) 20 MG tablet TAKE TWO TABLETS BY MOUTH FOUR TIMES A DAY 240 tablet 1   busPIRone (BUSPAR) 5 MG tablet Take 5 mg by mouth 3 (three) times daily.     cephALEXin (KEFLEX) 250 MG capsule Take 250 mg by mouth daily.     dantrolene (DANTRIUM) 100 MG capsule TAKE 1 CAPSULE BY MOUTH 3 TIMES DAILY. 90 capsule 5   Docusate Sodium (DSS) 100 MG CAPS Take  100 mg by mouth 2 (two) times daily with a meal.     EPIPEN 2-PAK 0.3 MG/0.3ML SOAJ injection Inject 0.3 mg into the muscle daily as needed for anaphylaxis.  1   fluticasone (FLONASE) 50 MCG/ACT nasal spray INSERT 2 SPRAYS IN EACH NOSTRIL EVERY DAY FOR STUFFY NOSE OR DRAINAGE 16 g 5   gabapentin (NEURONTIN) 300 MG capsule Take 300 mg by mouth 3 (three) times daily.     hydrocortisone cream 0.5 % Apply 1 application topically 2 (two) times daily. To hairline and brows     ibuprofen (ADVIL) 200 MG tablet Take 400 mg by mouth every 6 (six) hours as needed for fever, headache, mild pain, moderate pain or cramping.     imipramine (TOFRANIL) 50 MG tablet TAKE 2 TABLETS (100 MG TOTAL) BY MOUTH AT BEDTIME. 60 tablet 11   ketorolac (ACULAR) 0.5 %  ophthalmic solution Place 1 drop into both eyes daily.      lactulose (CHRONULAC) 10 GM/15ML solution Take 10 g by mouth daily as needed for mild constipation.     levocetirizine (XYZAL) 5 MG tablet TAKE 1 TABLET BY MOUTH EVERY DAY AS NEEDED 30 tablet 0   medroxyPROGESTERone (DEPO-PROVERA) 150 MG/ML injection Inject 1 mL (150 mg total) into the muscle every 3 (three) months. 1 mL 3   METAMUCIL FIBER PO Take by mouth.     metoprolol succinate (TOPROL-XL) 25 MG 24 hr tablet Take 1 tablet (25 mg total) by mouth in the morning and at bedtime. 60 tablet 3   Multiple Vitamin (MULTIVITAMIN WITH MINERALS) TABS tablet Take 1 tablet by mouth daily.     mupirocin ointment (BACTROBAN) 2 % 1 application by Tracheal Tube route daily. Applies to trach-stoma site     nystatin (MYCOSTATIN) powder Apply 1 application topically 2 (two) times daily as needed (skin irritation). Small amount . Rash     polyethylene glycol powder (MIRALAX) 17 GM/SCOOP powder Start taking 1 capful 3 times a day. Slowly cut back as needed until you have normal bowel movements. 255 g 0   psyllium (HYDROCIL/METAMUCIL) 95 % PACK Take 1 packet by mouth daily.     senna (SENOKOT) 8.6 MG tablet Take 1 tablet by  mouth every other day.     sertraline (ZOLOFT) 100 MG tablet Take 100 mg by mouth daily.     solifenacin (VESICARE) 10 MG tablet Take 10 mg by mouth daily.     sucralfate (CARAFATE) 1 GM/10ML suspension Take 1 g by mouth 3 (three) times daily.      topiramate (TOPAMAX) 50 MG tablet Take 1 tablet (50 mg total) by mouth at bedtime. 90 tablet 3   diphenhydrAMINE (BENADRYL) 25 MG tablet Take 1 tablet (25 mg total) by mouth every 6 (six) hours as needed for itching or allergies. Take with Levofloxacin to prevent allergic reaction 30 tablet 0   Current Facility-Administered Medications  Medication Dose Route Frequency Provider Last Rate Last Admin   incobotulinumtoxinA (XEOMIN) 100 units injection 400 Units  400 Units Intramuscular Q90 days Levert Feinstein, MD   400 Units at 10/28/22 2122   Allergies: Allergies  Allergen Reactions   Bee Venom Shortness Of Breath, Swelling and Anaphylaxis    SHOCK   Amoxicillin Hives and Itching    Tolerates Keflex, Rocephin Has patient had a PCN reaction causing immediate rash, facial/tongue/throat swelling, SOB or lightheadedness with hypotension: no Has patient had a PCN reaction causing severe rash involving mucus membranes or skin necrosis: no Has patient had a PCN reaction that required hospitalization: no Has patient had a PCN reaction occurring within the last 10 years: unknown If all of the above answers are "NO", then may proceed with Cephalosporin use.    Ciprofloxacin Hives and Rash   Tobramycin Hives and Rash   Erythromycin Other (See Comments)    UNSPECIFIED REACTION     Trazodone Other (See Comments)    UNSPECIFIED REACTION  Eyes shake   Trazodone And Nefazodone     UNSPECIFIED REACTION    Morphine And Related Other (See Comments)    "GOES CRAZY"    Vantin [Cefpodoxime] Palpitations   Social History: Social History   Socioeconomic History   Marital status: Single    Spouse name: Not on file   Number of children: Not on file   Years of  education: college    Highest education level: Not on file  Occupational History   Occupation: disabled  Tobacco Use   Smoking status: Never   Smokeless tobacco: Never  Vaping Use   Vaping Use: Never used  Substance and Sexual Activity   Alcohol use: No    Alcohol/week: 0.0 standard drinks of alcohol   Drug use: No    Types: Marijuana   Sexual activity: Yes    Birth control/protection: None  Other Topics Concern   Not on file  Social History Narrative   08/29/17 Lives at home with boyfriend, Has a Control and instrumentation engineer that helps to care for her    Drinks no caffeine    Social Determinants of Radio broadcast assistant Strain: Not on file  Food Insecurity: Not on file  Transportation Needs: Not on file  Physical Activity: Not on file  Stress: Not on file  Social Connections: Not on file   Lives in an apartment. Smoking: denies Occupation: not employed  Environmental HistoryFreight forwarder in the house: no Charity fundraiser in the family room: no Carpet in the bedroom: no Heating: electric Cooling: central Pet: yes 1 cat x 7 yrs  Family History: Family History  Problem Relation Age of Onset   Diabetes Mother    Hypertension Mother    Varicose Veins Mother    Cancer Maternal Aunt        lung   Diabetes Maternal Grandfather    Heart disease Maternal Grandfather    Heart disease Paternal Grandfather    Other Sister        overdose   Problem                               Relation Asthma                                   Brother  Eczema                                Mother  Food allergy                          no Allergic rhino conjunctivitis     no  Review of Systems  Constitutional:  Negative for appetite change, chills, fever and unexpected weight change.  HENT:  Negative for congestion and rhinorrhea.   Eyes:  Negative for itching.  Respiratory:  Negative for cough, chest tightness, shortness of breath and wheezing.   Cardiovascular:  Negative for chest pain.   Gastrointestinal:  Negative for abdominal pain.  Genitourinary:  Negative for difficulty urinating.  Skin:  Negative for rash.  Neurological:  Negative for headaches.    Objective: BP 128/70   Pulse 83   Temp 99.1 F (37.3 C)   Resp 20   Ht 5\' 2"  (1.575 m)   Wt 130 lb (59 kg)   SpO2 99%   BMI 23.78 kg/m  Body mass index is 23.78 kg/m. Physical Exam Vitals and nursing note reviewed.  Constitutional:      Appearance: She is well-developed.     Comments: In wheelchair  HENT:     Head: Normocephalic.     Right Ear: Tympanic membrane and external ear normal.     Left Ear: Tympanic membrane and external ear normal.     Nose: Nose normal.  Mouth/Throat:     Mouth: Mucous membranes are moist.     Pharynx: Oropharynx is clear.  Eyes:     Conjunctiva/sclera: Conjunctivae normal.  Cardiovascular:     Rate and Rhythm: Normal rate and regular rhythm.     Heart sounds: Normal heart sounds. No murmur heard.    No friction rub. No gallop.  Pulmonary:     Breath sounds: Normal breath sounds. No wheezing, rhonchi or rales.  Musculoskeletal:     Cervical back: Neck supple.  Skin:    General: Skin is warm.     Findings: No rash.  Neurological:     Mental Status: She is alert and oriented to person, place, and time. Mental status is at baseline.     Comments: quadriplegic  Psychiatric:        Behavior: Behavior normal.   The plan was reviewed with the patient/family, and all questions/concerned were addressed.  It was my pleasure to see Veronica Huff today and participate in her care. Please feel free to contact me with any questions or concerns.  Sincerely,  Wyline Mood, DO Allergy & Immunology  [facility] office: [phone] [facility] office: [phone]

## 2022-12-04 ENCOUNTER — Encounter: Payer: Self-pay | Admitting: Allergy

## 2022-12-04 ENCOUNTER — Other Ambulatory Visit: Payer: Self-pay

## 2022-12-04 ENCOUNTER — Ambulatory Visit (INDEPENDENT_AMBULATORY_CARE_PROVIDER_SITE_OTHER): Payer: Medicare Other | Admitting: Allergy

## 2022-12-04 VITALS — BP 128/70 | HR 83 | Temp 99.1°F | Resp 20 | Ht 62.0 in | Wt 130.0 lb

## 2022-12-04 DIAGNOSIS — G8250 Quadriplegia, unspecified: Secondary | ICD-10-CM | POA: Diagnosis not present

## 2022-12-04 DIAGNOSIS — Z889 Allergy status to unspecified drugs, medicaments and biological substances status: Secondary | ICD-10-CM | POA: Diagnosis not present

## 2022-12-04 DIAGNOSIS — Z880 Allergy status to penicillin: Secondary | ICD-10-CM

## 2022-12-04 DIAGNOSIS — B999 Unspecified infectious disease: Secondary | ICD-10-CM | POA: Diagnosis not present

## 2022-12-04 DIAGNOSIS — T63481A Toxic effect of venom of other arthropod, accidental (unintentional), initial encounter: Secondary | ICD-10-CM | POA: Insufficient documentation

## 2022-12-04 NOTE — Assessment & Plan Note (Signed)
Patient with a complicated medical history (quadriplegic after brain stem stroke at age 37) with multiple antibiotics allergies listed on her allergy list. 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 Reaction in the form of rash/hives to penicillin, cipro and tobramycin. Patient is insure of specific reactions/timeline but does not recall any significant reactions. Tolerates bactrim and is on prophylactic keflex with no issues.  Skin prick and intradermal testing today to penicillin-G, PrePen were negative which is great. Still need in office drug challenge to penicillin clear off allergy list.  Continue to avoid medications on your allergy list. Schedule drug challenge to amoxicillin and cipro on 2 separate days. You must be off antihistamines for 3-5 days before. Must be in good health and not ill. No vaccines/injections/antibiotics within the past 7 days. (Okay to take your prophylactic keflex). Plan on being in the office for 2-3 hours and must bring in the drug you want to do the oral challenge for - will send in prescription to pick up a few days before. You must call to schedule an appointment and specify it's for a drug challenge.  Epinephrine injectable device - demonstrated proper use. For mild symptoms you can take over the counter antihistamines such as Benadryl 1-2 tablets = 25-50mg .and monitor symptoms closely. If symptoms worsen or if you have severe symptoms including breathing issues, throat closure, significant swelling, whole body hives, severe diarrhea and vomiting, lightheadedness then inject epinephrine and seek immediate medical care afterwards. Emergency action plan given.

## 2022-12-04 NOTE — Patient Instructions (Addendum)
Skin prick and intradermal testing today to penicillin-G, PrePen were negative which is great. Still need in office drug challenge to clear off allergy list.   Multiple drug allergies. Continue to avoid medications on your allergy list.  If interested we can schedule drug challenge to amoxicillin and cipro on 2 separate days. You must be off antihistamines for 3-5 days before. Must be in good health and not ill. No vaccines/injections/antibiotics within the past 7 days. (Okay to take your prophylactic keflex) Plan on being in the office for 2-3 hours and must bring in the drug you want to do the oral challenge for - will send in prescription to pick up a few days before. You must call to schedule an appointment and specify it's for a drug challenge.  Epinephrine injectable device - demonstrated proper use. For mild symptoms you can take over the counter antihistamines such as Benadryl 1-2 tablets = 25-50mg .and monitor symptoms closely. If symptoms worsen or if you have severe symptoms including breathing issues, throat closure, significant swelling, whole body hives, severe diarrhea and vomiting, lightheadedness then inject epinephrine and seek immediate medical care afterwards. Emergency action plan given.  Bee stings Continue to avoid.  Recurrent infections Keep track of infections and antibiotics use. Get bloodwork to look at immune system at next visit.   Follow up for drug challenge.

## 2022-12-04 NOTE — Assessment & Plan Note (Signed)
Keep track of infections and antibiotics use. Get bloodwork to look at immune system at next visit.

## 2022-12-05 ENCOUNTER — Other Ambulatory Visit: Payer: Self-pay | Admitting: Neurology

## 2022-12-13 ENCOUNTER — Ambulatory Visit: Payer: Medicare Other | Attending: Cardiology | Admitting: Cardiology

## 2022-12-13 DIAGNOSIS — I959 Hypotension, unspecified: Secondary | ICD-10-CM | POA: Diagnosis present

## 2022-12-13 DIAGNOSIS — R000 Tachycardia, unspecified: Secondary | ICD-10-CM | POA: Diagnosis present

## 2022-12-13 DIAGNOSIS — R002 Palpitations: Secondary | ICD-10-CM | POA: Insufficient documentation

## 2022-12-13 MED ORDER — METOPROLOL SUCCINATE ER 25 MG PO TB24: ORAL_TABLET | ORAL | 3 refills | Status: DC

## 2022-12-13 NOTE — Progress Notes (Signed)
Virtual Visit via Video Note   Because of Veronica Huff's co-morbid illnesses, she is at least at moderate risk for complications without adequate follow up.  This format is felt to be most appropriate for this patient at this time.  All issues noted in this document were discussed and addressed.  A limited physical exam was performed with this format.  Please refer to the patient's chart for her consent to telehealth for [facility].     Date:  12/13/2022   ID:  Veronica Huff, DOB 06-07-1986, MRN [id number] The patient was identified using 2 identifiers.  Patient Location: Home Provider Location: Home Office   PCP:  Schmerge, Meredeth Ide, NP   [place] Providers Cardiologist:  None Electrophysiologist:  Vickie Epley, MD     Evaluation Performed:  Follow-Up Visit  Chief Complaint: Follow-up  History of Present Illness:    Veronica Huff is a 37 y.o. female with a history of sinus tachycardia that presents for follow-up.  She is on beta-blocker for control of symptoms associated with her resting tachycardia. Today she tells me her heart issues have been well-controlled metoprolol twice daily.  She is tolerating the medication okay.   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   Recurrent UTI (urinary tract infection)    Due to suprapubic stoma, repetitive catheterizations    Sleep apnea    On ventilator at night -has oxygen but rarely needs unless she has a cold and sats drop   Stroke [facility])    Past Surgical History:  Procedure Laterality Date   BLADDER SURGERY  2009,2012   BRONCHOSCOPY  2006   CHOLECYSTECTOMY  11/18/2012   Procedure: LAPAROSCOPIC CHOLECYSTECTOMY;  Surgeon: Ralene Ok, MD;  Location: [facility];  Service: General;  Laterality: N/A;   ESOPHAGOGASTRODUODENOSCOPY     GASTROSTOMY TUBE PLACEMENT  2000   ILEOSTOMY  2006   MICROLARYNGOSCOPY WITH LASER AND BALLOON DILATION N/A 09/27/2016   Procedure: MICROLARYNGOSCOPY WITH LASER AND BALLOON DILATION;  Surgeon: Melida Quitter, MD;  Location: [facility];  Service: ENT;  Laterality: N/A;  micro direct laryngoscopy with co2 laser balloon dilation of trachea   REMOVAL OF GASTROSTOMY TUBE     SPINE SURGERY  2004,2011   TENDON REPAIR  2012   Left wrist   THROAT SURGERY  2004   TRACHEOSTOMY  2000   wirst surgery Left    WISDOM TOOTH EXTRACTION  2008     No outpatient medications have been marked as taking for the 12/13/22 encounter (Video Visit) with Vickie Epley, MD.   Current Facility-Administered Medications for the 12/13/22 encounter (Video Visit) with Vickie Epley, MD  Medication   incobotulinumtoxinA (XEOMIN) 100 units injection 400 Units     Allergies:   Bee venom, Amoxicillin, Ciprofloxacin, Tobramycin, Erythromycin, Trazodone, Trazodone and nefazodone, Morphine and related, and  Vantin [cefpodoxime]   Social History   Tobacco Use   Smoking status: Never   Smokeless tobacco: Never  Vaping Use   Vaping Use: Never used  Substance Use Topics   Alcohol use: No    Alcohol/week: 0.0 standard drinks of alcohol   Drug use: No    Types: Marijuana     Family Hx: The patient's family history includes Cancer in her maternal aunt; Diabetes in her maternal grandfather and mother; Heart disease in her maternal grandfather and paternal grandfather; Hypertension in her mother; Other in her  sister; Varicose Veins in her mother.  ROS:   Please see the history of present illness.     All other systems reviewed and are negative.       Objective:    Vital Signs:  There were no vitals taken for this visit.   VITAL SIGNS:  reviewed  ASSESSMENT & PLAN:     #Sinus tachycardia #Palpitations Continue metoprolol succinate 25 mg by mouth twice daily  #Hypotension Likely due to dehydration given she has not had p.o. intake by the morning time and after catheterization overnight.  I encouraged p.o. hydration with careful monitoring of her blood pressures moving forward.  Follow-up 1 year with APP      Time:   Today, I have spent 3 minutes with the patient with telehealth technology discussing the above problems.     Medication Adjustments/Labs and Tests Ordered: Current medicines are reviewed at length with the patient today.  Concerns regarding medicines are outlined above.   Tests Ordered: No orders of the defined types were placed in this encounter.   Medication Changes: Meds ordered this encounter  Medications   metoprolol succinate (TOPROL-XL) 25 MG 24 hr tablet    Sig: Take 1 tablet (25 mg total) by mouth in the morning and at bedtime.    Dispense:  180 tablet    Refill:  3      Signed, Vickie Epley, MD  12/13/2022 4:15 PM    [place]

## 2022-12-13 NOTE — Patient Instructions (Signed)
Medication Instructions:  Your physician recommends that you continue on your current medications as directed. Please refer to the Current Medication list given to you today.  *If you need a refill on your cardiac medications before your next appointment, please call your pharmacy*  Follow-Up: At [facility], you and your health needs are our priority.  As part of our continuing mission to provide you with exceptional heart care, we have created designated Provider Care Teams.  These Care Teams include your primary Cardiologist (physician) and Advanced Practice Providers (APPs -  Physician Assistants and Nurse Practitioners) who all work together to provide you with the care you need, when you need it.  Your next appointment:   1 year(s)  Provider:   You may see CAMERON T LAMBERT, MD or one of the following Advanced Practice Providers on your designated Care Team:   Renee Ursuy, PA-C Michael "Andy" Tillery, PA-C Suzann Riddle, NP    

## 2022-12-14 ENCOUNTER — Encounter: Payer: Self-pay | Admitting: Family Medicine

## 2022-12-21 ENCOUNTER — Other Ambulatory Visit: Payer: Self-pay | Admitting: Neurology

## 2022-12-21 DIAGNOSIS — R519 Headache, unspecified: Secondary | ICD-10-CM

## 2022-12-25 ENCOUNTER — Encounter: Payer: Medicare Other | Admitting: Family Medicine

## 2022-12-25 NOTE — Telephone Encounter (Signed)
Rx refilled.

## 2022-12-27 ENCOUNTER — Other Ambulatory Visit (HOSPITAL_COMMUNITY): Payer: Self-pay

## 2022-12-28 ENCOUNTER — Other Ambulatory Visit (HOSPITAL_COMMUNITY): Payer: Self-pay

## 2023-01-04 ENCOUNTER — Other Ambulatory Visit: Payer: Self-pay | Admitting: Family Medicine

## 2023-01-04 ENCOUNTER — Telehealth: Payer: Self-pay | Admitting: Allergy

## 2023-01-04 MED ORDER — AMOXICILLIN 400 MG/5ML PO SUSR: ORAL | 0 refills | Status: DC

## 2023-01-04 MED ORDER — AMOXICILLIN-POT CLAVULANATE 250-62.5 MG/5ML PO SUSR: ORAL | 0 refills | Status: DC

## 2023-01-04 NOTE — Telephone Encounter (Signed)
Tried calling the patient twice and no answer wasn't able to leave a message at the number on file.

## 2023-01-04 NOTE — Telephone Encounter (Signed)
Please advise on how much to send in for challenge.

## 2023-01-04 NOTE — Telephone Encounter (Signed)
Patient is scheduled for penicillin challenge 01-08-2023.   Patient is requesting for penicillin to be sent in.   [place] [address]  Best contact number:   [phone]

## 2023-01-04 NOTE — Telephone Encounter (Signed)
Can you please let this patient know that the order has been placed and she can pick up at her pharmacy when it's ready. Thank you

## 2023-01-08 ENCOUNTER — Ambulatory Visit: Payer: Medicare Other | Admitting: Family Medicine

## 2023-01-08 ENCOUNTER — Encounter: Payer: Self-pay | Admitting: Family Medicine

## 2023-01-08 ENCOUNTER — Other Ambulatory Visit: Payer: Self-pay

## 2023-01-08 VITALS — BP 122/70 | HR 83 | Temp 98.7°F | Resp 20 | Wt 130.0 lb

## 2023-01-08 DIAGNOSIS — Z889 Allergy status to unspecified drugs, medicaments and biological substances status: Secondary | ICD-10-CM

## 2023-01-08 NOTE — Patient Instructions (Signed)
Drug allergy Reschedule for a different day for drug testing to amoxicillin.  Thank you how is she doing Remember to help blood pressurestop antihistamines for three days prior to the in the office drug testing date  Call the clinic if this treatment plan is not working well for you.  Follow up in 1-2 weeks or sooner if needed.

## 2023-01-08 NOTE — Progress Notes (Signed)
   [place] [address] Dept: [phone]  FOLLOW UP NOTE  Patient ID: Veronica Huff, female    DOB: 04-13-1986  Age: 37 y.o. MRN: [id number] Date of Office Visit: 01/08/2023  Assessment  Chief Complaint: Food/Drug Challenge (Amoxicillin)  HPI Veronica Huff is a 37 year old female who presents to the clinic for an oral amoxicillin challenge.  She was last seen in this clinic on 12/04/2022 by Dr. Maudie Mercury for evaluation of allergy to multiple drugs.  She had negative skin prick testing to penicillin and Pre-Pen on 12/04/2022. Patient reports that she tool an antihistamine last night. She will reschedule for a different day.   Drug Allergies:  Allergies  Allergen Reactions   Bee Venom Shortness Of Breath, Swelling and Anaphylaxis    SHOCK   Amoxicillin Hives and Itching    Tolerates Keflex, Rocephin Has patient had a PCN reaction causing immediate rash, facial/tongue/throat swelling, SOB or lightheadedness with hypotension: no Has patient had a PCN reaction causing severe rash involving mucus membranes or skin necrosis: no Has patient had a PCN reaction that required hospitalization: no Has patient had a PCN reaction occurring within the last 10 years: unknown If all of the above answers are "NO", then may proceed with Cephalosporin use.    Ciprofloxacin Hives and Rash   Tobramycin Hives and Rash   Erythromycin Other (See Comments)    UNSPECIFIED REACTION     Trazodone Other (See Comments)    UNSPECIFIED REACTION  Eyes shake   Trazodone And Nefazodone     UNSPECIFIED REACTION    Morphine And Related Other (See Comments)    "GOES CRAZY"    Vantin [Cefpodoxime] Palpitations    Physical Exam: BP 122/70   Pulse 83   Temp 98.7 F (37.1 C) (Temporal)   Resp 20   Wt 130 lb (59 kg)   SpO2 97%   BMI 23.78 kg/m       Assessment and Plan: 1. Multiple drug allergies     No orders of the defined types were placed in this encounter.   Patient  Instructions  Drug allergy Reschedule for a different day for drug testing to amoxicillin.  Thank you how is she doing Remember to help blood pressurestop antihistamines for three days prior to the in the office drug testing date  Call the clinic if this treatment plan is not working well for you.  Follow up in 1-2 weeks or sooner if needed.    No follow-ups on file.    Thank you for the opportunity to care for this patient.  Please do not hesitate to contact me with questions.  Gareth Morgan, FNP [facility]

## 2023-01-09 ENCOUNTER — Other Ambulatory Visit (HOSPITAL_COMMUNITY): Payer: Self-pay

## 2023-01-09 ENCOUNTER — Other Ambulatory Visit: Payer: Self-pay | Admitting: Neurology

## 2023-01-10 ENCOUNTER — Other Ambulatory Visit (HOSPITAL_COMMUNITY): Payer: Self-pay

## 2023-01-11 ENCOUNTER — Other Ambulatory Visit: Payer: Self-pay | Admitting: Family Medicine

## 2023-01-11 ENCOUNTER — Other Ambulatory Visit: Payer: Self-pay | Admitting: Neurology

## 2023-01-11 DIAGNOSIS — Z3042 Encounter for surveillance of injectable contraceptive: Secondary | ICD-10-CM

## 2023-01-11 NOTE — Telephone Encounter (Signed)
Rx refilled.

## 2023-01-12 ENCOUNTER — Other Ambulatory Visit (HOSPITAL_COMMUNITY): Payer: Self-pay

## 2023-01-15 ENCOUNTER — Other Ambulatory Visit (HOSPITAL_COMMUNITY): Payer: Self-pay

## 2023-01-15 ENCOUNTER — Telehealth: Payer: Self-pay | Admitting: Neurology

## 2023-01-15 ENCOUNTER — Other Ambulatory Visit: Payer: Self-pay

## 2023-01-15 ENCOUNTER — Encounter: Payer: Self-pay | Admitting: Family Medicine

## 2023-01-15 ENCOUNTER — Ambulatory Visit (INDEPENDENT_AMBULATORY_CARE_PROVIDER_SITE_OTHER): Payer: Medicare Other | Admitting: Family Medicine

## 2023-01-15 VITALS — BP 122/70 | HR 95 | Resp 20 | Wt 130.0 lb

## 2023-01-15 DIAGNOSIS — Z880 Allergy status to penicillin: Secondary | ICD-10-CM | POA: Diagnosis not present

## 2023-01-15 MED ORDER — INCOBOTULINUMTOXINA 100 UNITS IM SOLR
400.0000 [IU] | Freq: Once | INTRAMUSCULAR | 0 refills | Status: AC
  Filled 2023-01-15: qty 4, 1d supply, fill #0

## 2023-01-15 NOTE — Addendum Note (Signed)
Addended by: Reyne Dumas on: 01/15/2023 09:45 AM   Modules accepted: Orders

## 2023-01-15 NOTE — Patient Instructions (Addendum)
In office amoxicillin oral challenge Veronica Huff was able to tolerate the amoxicillin drug challenge today at the office without adverse signs or symptoms of an allergic reaction. Therefore, she has the same risk of systemic reaction associated with the use of amoxicillin or penicillin as the general population.  - Do not give any amoxicillin or penicillin for the next 24 hours. - Monitor for allergic symptoms such as rash, wheezing, diarrhea, swelling, and vomiting for the next 24 hours. If severe symptoms occur, treat with EpiPen injection and call 911. For less severe symptoms treat with Benadryl 4 teaspoonfuls every 4 hours and call the clinic.  - If ypu develop an allergic reaction to amoxicillin or penicillin, record what was given, the amount given, time from ingestion to reaction, and symptoms.   Drug allergy Continue to avoid ciprofloxacin. Schedule an in office drug challenge to ciprofloxacin when it is convenient for you. Remember to stop antihistamines for 3 days before the drug challenge.   Call the clinic if this treatment plan is not working well for you  Follow up in 1-2 months or sooner if needed.

## 2023-01-15 NOTE — Telephone Encounter (Signed)
Rx sent to [facility].

## 2023-01-15 NOTE — Progress Notes (Signed)
[place] [address] Dept: [phone]  FOLLOW UP NOTE  Patient ID: Veronica Huff, female    DOB: [birth year]  Age: 37 y.o. MRN: [id number] Date of Office Visit: 01/15/2023  Assessment  Chief Complaint: Food/Drug Challenge (Amoxicillin)  HPI Veronica Huff is a 37 year old female who presents to the clinic for an oral amoxicillin challenge.  She was last seen in this clinic on 12/04/2022 by Dr. Maudie Mercury for evaluation of allergy to multiple drugs.  She had negative skin prick testing to penicillin and Pre-Pen on 12/04/2022. She is accompanied by her caregiver.   At today's visit, she reports that she is feeling well overall with no cardiopulmonary, integumentary, or gastrointestinal symptoms. She has not had any antihistamines over the last 3 days. Her current medications are listed in the chart.   Drug Allergies:  Allergies  Allergen Reactions   Bee Venom Shortness Of Breath, Swelling and Anaphylaxis    SHOCK   Ciprofloxacin Hives and Rash   Tobramycin Hives and Rash   Erythromycin Other (See Comments)    UNSPECIFIED REACTION     Trazodone Other (See Comments)    UNSPECIFIED REACTION  Eyes shake   Trazodone And Nefazodone     UNSPECIFIED REACTION    Morphine And Related Other (See Comments)    "GOES CRAZY"    Vantin [Cefpodoxime] Palpitations    Physical Exam: BP 122/70   Pulse 95   Resp 20   Wt 130 lb (59 kg)   SpO2 98%   BMI 23.78 kg/m    Physical Exam Vitals reviewed.  Constitutional:      Appearance: Normal appearance.  HENT:     Head: Normocephalic and atraumatic.     Right Ear: Tympanic membrane normal.     Left Ear: Tympanic membrane normal.     Nose:     Comments: Bilateral nares normal. Pharynx normal. Trach patent. Ears normal. Eyes normal.    Mouth/Throat:     Pharynx: Oropharynx is clear.  Eyes:     Conjunctiva/sclera: Conjunctivae normal.  Cardiovascular:     Rate and Rhythm: Normal rate and regular rhythm.     Heart sounds:  Normal heart sounds. No murmur heard. Pulmonary:     Effort: Pulmonary effort is normal.     Breath sounds: Normal breath sounds.     Comments: Lungs clear to auscultation Musculoskeletal:        General: Normal range of motion.     Cervical back: Normal range of motion and neck supple.  Skin:    General: Skin is warm and dry.  Neurological:     Mental Status: She is alert and oriented to person, place, and time.  Psychiatric:        Mood and Affect: Mood normal.        Behavior: Behavior normal.        Thought Content: Thought content normal.        Judgment: Judgment normal.    Open graded amoxicillin oral challenge: The patient was able to tolerate the challenge today without adverse signs or symptoms. Vital signs were stable throughout the challenge and observation period. She received multiple doses separated by 30 minutes, each of which was separated by vitals and a brief physical exam. She received the following doses: 1%, 10%, and 89% for a total does of 875 mg. She was monitored for 60 minutes following the last dose.  Total testing time: 140 minutes  The patient was able to tolerate  the open graded oral challenge today without adverse signs or symptoms. Therefore, she has the same risk of systemic reaction associated with  the use of amoxicillin  as the general population.   Assessment and Plan: 1. Penicillin allergy      Patient Instructions  In office amoxicillin oral challenge Veronica Huff was able to tolerate the amoxicillin drug challenge today at the office without adverse signs or symptoms of an allergic reaction. Therefore, she has the same risk of systemic reaction associated with the use of amoxicillin or penicillin as the general population.  - Do not give any amoxicillin or penicillin for the next 24 hours. - Monitor for allergic symptoms such as rash, wheezing, diarrhea, swelling, and vomiting for the next 24 hours. If severe symptoms occur, treat with EpiPen  injection and call 911. For less severe symptoms treat with Benadryl 4 teaspoonfuls every 4 hours and call the clinic.  - If ypu develop an allergic reaction to amoxicillin or penicillin, record what was given, the amount given, time from ingestion to reaction, and symptoms.   Drug allergy Continue to avoid ciprofloxacin. Schedule an in office drug challenge to ciprofloxacin when it is convenient for you. Remember to stop antihistamines for 3 days before the drug challenge.   Call the clinic if this treatment plan is not working well for you  Follow up in 1-2 months or sooner if needed.   Return in about 2 months (around 03/16/2023), or if symptoms worsen or fail to improve.    Thank you for the opportunity to care for this patient.  Please do not hesitate to contact me with questions.  Gareth Morgan, FNP [facility] [place]

## 2023-01-15 NOTE — Telephone Encounter (Signed)
[facility] is stating they need new Xeomin Rx before they can send medication. Please send this over, thank you!

## 2023-01-16 ENCOUNTER — Other Ambulatory Visit (HOSPITAL_COMMUNITY): Payer: Self-pay

## 2023-01-22 ENCOUNTER — Telehealth: Payer: Self-pay | Admitting: Neurology

## 2023-01-22 NOTE — Telephone Encounter (Signed)
Pt states Xeomin makes her spasms worse, pt no longer wants that treatment, please call pt to discuss other options.

## 2023-01-23 NOTE — Telephone Encounter (Signed)
Patient already on polypharmacy, has some breathing difficulty due to her brainstem pathology, limited choice for adding on more medications,  Already on baclofen 20 mg 4 times a day, Dantrolene 100 mg 3 times a day Gabapentin 300 mg 3 times a day, Also on Topamax 50 mg every night,  1.  May consider change injection pattern if she desired 2.  Higher dose of gabapentin

## 2023-01-24 ENCOUNTER — Ambulatory Visit: Payer: Medicare Other | Admitting: Neurology

## 2023-01-24 MED ORDER — BACLOFEN 20 MG PO TABS: ORAL_TABLET | ORAL | 3 refills | Status: DC

## 2023-01-24 NOTE — Addendum Note (Signed)
Addended by: Suzzanne Cloud on: 01/24/2023 01:40 PM   Modules accepted: Orders

## 2023-01-25 ENCOUNTER — Other Ambulatory Visit (HOSPITAL_COMMUNITY): Payer: Self-pay

## 2023-01-25 MED ORDER — SODIUM CHLORIDE 0.9 % IR SOLN
3 refills | Status: DC
  Filled 2023-01-25: qty 5000, 16d supply, fill #0
  Filled 2023-11-06 – 2023-11-07 (×3): qty 5000, 16d supply, fill #1
  Filled 2023-11-08: qty 2500, 8d supply, fill #1
  Filled 2023-12-02: qty 5000, 16d supply, fill #2
  Filled 2023-12-28 – 2023-12-31 (×2): qty 5000, 16d supply, fill #3

## 2023-01-26 ENCOUNTER — Other Ambulatory Visit (HOSPITAL_COMMUNITY): Payer: Self-pay

## 2023-02-21 ENCOUNTER — Encounter: Payer: Self-pay | Admitting: Obstetrics & Gynecology

## 2023-02-21 ENCOUNTER — Ambulatory Visit (INDEPENDENT_AMBULATORY_CARE_PROVIDER_SITE_OTHER): Payer: Medicare Other | Admitting: Obstetrics & Gynecology

## 2023-02-21 ENCOUNTER — Other Ambulatory Visit (HOSPITAL_COMMUNITY)
Admission: RE | Admit: 2023-02-21 | Discharge: 2023-02-21 | Disposition: A | Discharge status: Home / Self Care | Payer: Medicare Other | Source: Ambulatory Visit | Attending: Family Medicine | Admitting: Family Medicine

## 2023-02-21 VITALS — BP 109/70 | HR 81 | Ht 62.0 in | Wt 132.0 lb

## 2023-02-21 DIAGNOSIS — Z01419 Encounter for gynecological examination (general) (routine) without abnormal findings: Secondary | ICD-10-CM

## 2023-02-21 DIAGNOSIS — Z113 Encounter for screening for infections with a predominantly sexual mode of transmission: Secondary | ICD-10-CM | POA: Insufficient documentation

## 2023-02-21 DIAGNOSIS — B3731 Acute candidiasis of vulva and vagina: Secondary | ICD-10-CM

## 2023-02-21 DIAGNOSIS — Z1151 Encounter for screening for human papillomavirus (HPV): Secondary | ICD-10-CM | POA: Insufficient documentation

## 2023-02-21 DIAGNOSIS — Z0000 Encounter for general adult medical examination without abnormal findings: Secondary | ICD-10-CM | POA: Diagnosis not present

## 2023-02-21 DIAGNOSIS — N9089 Other specified noninflammatory disorders of vulva and perineum: Secondary | ICD-10-CM | POA: Diagnosis present

## 2023-02-21 DIAGNOSIS — N898 Other specified noninflammatory disorders of vagina: Secondary | ICD-10-CM | POA: Diagnosis not present

## 2023-02-21 NOTE — Progress Notes (Signed)
GYNECOLOGY ANNUAL PREVENTATIVE CARE ENCOUNTER NOTE  History:     Veronica Huff is a 37 y.o. G31 female  with history of quadriplegia and other multiple medical co-morbidities here for a routine annual gynecologic exam and pap smear.  Current complaints: swollen labia, and some discharge.   Denies abnormal vaginal bleeding, pelvic pain, problems with intercourse or other gynecologic concerns.    Gynecologic History No LMP recorded. Patient has had an injection. Contraception: Depo-Provera injections Last Pap: 10/19/2021. Result was LGSIL with negative HPV  Obstetric History OB History  Gravida Para Term Preterm AB Living  0 0 0 0 0 0  SAB IAB Ectopic Multiple Live Births  0 0 0 0      Past Medical History:  Diagnosis Date   Anxiety    CVA (cerebral infarction)    Ischemic brainstem stroke in 2000 at age 56 due to unknown reasons--quadriplegic after stroke.   Depression    Zoloft has helped-not having any current depression   Edema extremities    Feet and legs-mostly in feet   Headache(784.0)    Prior to imipramine-none currently   Pneumonia    Quadriplegia    Secondary to stroke at age 20-has trach-lives with boyfriend-motorized w/c-joystick to chin to drive w/c--I & O cath thru suprapubic stoma - has nursing care 16 hrs daily and boyfriend able to help pt the other hours of day.   Recurrent UTI (urinary tract infection)    Due to suprapubic stoma, repetitive catheterizations   Sleep apnea    On ventilator at night -has oxygen but rarely needs unless she has a cold and sats drop   Stroke     Past Surgical History:  Procedure Laterality Date   BLADDER SURGERY  2009,2012   BRONCHOSCOPY  2006   CHOLECYSTECTOMY  11/18/2012   Procedure: LAPAROSCOPIC CHOLECYSTECTOMY;  Surgeon: Ralene Ok, MD;  Location: [facility];  Service: General;  Laterality: N/A;   ESOPHAGOGASTRODUODENOSCOPY     GASTROSTOMY TUBE PLACEMENT  2000   ILEOSTOMY  2006   MICROLARYNGOSCOPY WITH LASER  AND BALLOON DILATION N/A 09/27/2016   Procedure: MICROLARYNGOSCOPY WITH LASER AND BALLOON DILATION;  Surgeon: Melida Quitter, MD;  Location: [facility];  Service: ENT;  Laterality: N/A;  micro direct laryngoscopy with co2 laser balloon dilation of trachea   REMOVAL OF GASTROSTOMY TUBE  2000   SPINE SURGERY  2004,2011   TENDON REPAIR  2012   Left wrist   THROAT SURGERY  2004   TRACHEOSTOMY  2000   wirst surgery Left    WISDOM TOOTH EXTRACTION  2008    Current Outpatient Medications on File Prior to Visit  Medication Sig Dispense Refill   albuterol (PROVENTIL) (2.5 MG/3ML) 0.083% nebulizer solution Take 3 mLs (2.5 mg total) by nebulization every 6 (six) hours as needed for wheezing or shortness of breath. 360 mL 5   albuterol (VENTOLIN HFA) 108 (90 Base) MCG/ACT inhaler Inhale into the lungs.     amoxicillin (AMOXIL) 400 MG/5ML suspension DO NOT TAKE THIS MEDICATION- FOR IN CLINIC DRUG CHALLENGE ONLY. BRING TO YOUR APPOINTMENT ON MONDAY (Patient not taking: Reported on 01/08/2023) 100 mL 0   baclofen (LIORESAL) 20 MG tablet Take 2 tablets 4 times daily 240 tablet 3   busPIRone (BUSPAR) 5 MG tablet Take 5 mg by mouth 3 (three) times daily.     cephALEXin (KEFLEX) 250 MG capsule Take 250 mg by mouth daily.     dantrolene (DANTRIUM) 100 MG capsule TAKE 1 CAPSULE  BY MOUTH 3 TIMES DAILY. 90 capsule 5   EPIPEN 2-PAK 0.3 MG/0.3ML SOAJ injection Inject 0.3 mg into the muscle daily as needed for anaphylaxis.  1   fluticasone (FLONASE) 50 MCG/ACT nasal spray INSERT 2 SPRAYS IN EACH NOSTRIL EVERY DAY FOR STUFFY NOSE OR DRAINAGE 16 g 5   gabapentin (NEURONTIN) 300 MG capsule Take 300 mg by mouth 3 (three) times daily.     guaiFENesin (MUCINEX) 600 MG 12 hr tablet Take 600 mg by mouth as needed.     hydrocortisone cream 0.5 % Apply 1 application topically 2 (two) times daily. To hairline and brows     ibuprofen (ADVIL) 200 MG tablet Take 400 mg by mouth every 6 (six) hours as needed for fever, headache, mild  pain, moderate pain or cramping.     imipramine (TOFRANIL) 50 MG tablet TAKE TWO TABLETS BY MOUTH AT BEDTIME 60 tablet 10   ketorolac (ACULAR) 0.5 % ophthalmic solution Place 1 drop into both eyes daily.      lactulose (CHRONULAC) 10 GM/15ML solution Take 10 g by mouth daily as needed for mild constipation.     medroxyPROGESTERone (DEPO-PROVERA) 150 MG/ML injection Inject 1 mL (150 mg total) into the muscle every 3 (three) months. 1 mL 3   METAMUCIL FIBER PO Take by mouth.     metoprolol succinate (TOPROL-XL) 25 MG 24 hr tablet Take 1 tablet (25 mg total) by mouth in the morning and at bedtime. 180 tablet 3   Multiple Vitamin (MULTIVITAMIN WITH MINERALS) TABS tablet Take 1 tablet by mouth daily.     mupirocin ointment (BACTROBAN) 2 % 1 application by Tracheal Tube route daily. Applies to trach-stoma site     nystatin (MYCOSTATIN) powder Apply 1 application topically 2 (two) times daily as needed (skin irritation). Small amount . Rash     polyethylene glycol powder (MIRALAX) 17 GM/SCOOP powder Start taking 1 capful 3 times a day. Slowly cut back as needed until you have normal bowel movements. 255 g 0   pseudoephedrine (SUDAFED) 120 MG 12 hr tablet Take 120 mg by mouth as needed.     senna (SENOKOT) 8.6 MG tablet Take 1 tablet by mouth every other day.     sertraline (ZOLOFT) 100 MG tablet Take 200 mg by mouth daily.     sodium chloride irrigation 0.9 % irrigation Flush with 50 mls up to 6 times a day as directed 5000 mL 3   solifenacin (VESICARE) 10 MG tablet Take 10 mg by mouth daily.     sucralfate (CARAFATE) 1 GM/10ML suspension Take 1 g by mouth 3 (three) times daily.      topiramate (TOPAMAX) 50 MG tablet Take 1 tablet (50 mg total) by mouth at bedtime. 90 tablet 3   amoxicillin-clavulanate (AUGMENTIN) 250-62.5 MG/5ML suspension DO NOT TAKE THIS MEDICATION. FOR IN OFFICE DRUG CHALLENGE ONLY. BRING TO YOUR APPOINTMENT (Patient not taking: Reported on 01/08/2023) 150 mL 0   diphenhydrAMINE  (BENADRYL) 25 MG tablet Take 1 tablet (25 mg total) by mouth every 6 (six) hours as needed for itching or allergies. Take with Levofloxacin to prevent allergic reaction 30 tablet 0   Docusate Sodium (DSS) 100 MG CAPS Take 100 mg by mouth 2 (two) times daily with a meal. (Patient not taking: Reported on 02/21/2023)     levocetirizine (XYZAL) 5 MG tablet TAKE 1 TABLET BY MOUTH EVERY DAY AS NEEDED (Patient not taking: Reported on 01/08/2023) 30 tablet 0   PROMETHEGAN 12.5 MG suppository Place 12.5  mg rectally as needed. (Patient not taking: Reported on 02/21/2023)     psyllium (HYDROCIL/METAMUCIL) 95 % PACK Take 1 packet by mouth daily. (Patient not taking: Reported on 01/08/2023)     Current Facility-Administered Medications on File Prior to Visit  Medication Dose Route Frequency Provider Last Rate Last Admin   incobotulinumtoxinA (XEOMIN) 100 units injection 400 Units  400 Units Intramuscular Q90 days Marcial Pacas, MD   400 Units at 10/28/22 2122    Allergies  Allergen Reactions   Bee Venom Shortness Of Breath, Swelling and Anaphylaxis    SHOCK   Ciprofloxacin Hives and Rash   Tobramycin Hives and Rash   Erythromycin Other (See Comments)    UNSPECIFIED REACTION     Trazodone Other (See Comments)    UNSPECIFIED REACTION  Eyes shake   Trazodone And Nefazodone     UNSPECIFIED REACTION    Morphine And Related Other (See Comments)    "GOES CRAZY"    Vantin [Cefpodoxime] Palpitations    Social History:  reports that she has never smoked. She has never used smokeless tobacco. She reports that she does not drink alcohol and does not use drugs.  Family History  Problem Relation Age of Onset   Heart disease Paternal Grandfather    Diabetes Maternal Grandfather    Heart disease Maternal Grandfather    Other Father        tumor removed from kidney in 2024   Diabetes Mother    Hypertension Mother    Varicose Veins Mother    Other Sister        overdose   Cancer Maternal Aunt        lung     The following portions of the patient's history were reviewed and updated as appropriate: allergies, current medications, past family history, past medical history, past social history, past surgical history and problem list.  Review of Systems Pertinent items noted in HPI and remainder of comprehensive ROS otherwise negative.  Physical Exam:  BP 109/70   Pulse 81   Ht 5\' 2"  (1.575 m)   Wt 132 lb (59.9 kg)   BMI 24.14 kg/m  CONSTITUTIONAL: Well-developed, well-nourished female in no acute distress.  HENT:  Normocephalic, atraumatic, External right and left ear normal.  EYES: Conjunctivae and EOM are normal. Pupils are equal, round, and reactive to light. No scleral icterus.  NECK: Normal range of motion, supple, no masses.  Normal thyroid.  SKIN: Skin is warm and dry. No rash noted. Not diaphoretic. No erythema. No pallor. MUSCULOSKELETAL: Quadriplegic, wheel-chair bound NEUROLOGIC: Alert and oriented to person, place, and time. Normal reflexes, muscle tone coordination.  PSYCHIATRIC: Normal mood and affect. Normal behavior. Normal judgment and thought content. CARDIOVASCULAR: Normal heart rate noted, regular rhythm RESPIRATORY: Clear to auscultation bilaterally. Effort and breath sounds normal, no problems with respiration noted. BREASTS: Symmetric in size. No masses, tenderness, skin changes, nipple drainage, or lymphadenopathy bilaterally. Performed in the presence of a chaperone. ABDOMEN: Soft, no distention noted.  No tenderness, rebound or guarding.  PELVIC: Difficult examination due to mobility issues, two RNs helped with holding legs. Normal appearing external genitalia with slightly larger labia minora, no erythema, no lesions. Normal urethral meatus; normal appearing vaginal mucosa. Cervix is small and very anterior, was very hard to reach, hard to use long [place]. Pap smear obtained.  White vaginal discharge noted, testing sample obtained.   Normal uterine size, no other  palpable masses, no uterine or adnexal tenderness.  Performed in the presence of  a chaperone.   Assessment and Plan:    1. Vaginal discharge - Cervicovaginal ancillary only done, will follow up results and manage accordingly. Slightly hypertrophic labia could be due to constant friction with underwear/pads.  No erythema noted, but vaginal discharge noted.   2. Routine screening for STI (sexually transmitted infection) STI screen done, will follow up results and manage accordingly. - RPR+HBsAg+HCVAb+HIV - Cervicovaginal ancillary only  3. Well woman exam with routine gynecological exam - Cytology - PAP( [facility]) Will follow up results of pap smear and manage accordingly. Routine preventative health maintenance measures emphasized. Please refer to After Visit Summary for other counseling recommendations.      Verita Schneiders, MD, [place] for [employer], [place]

## 2023-02-22 LAB — CERVICOVAGINAL ANCILLARY ONLY
Bacterial Vaginitis (gardnerella): NEGATIVE
Candida Glabrata: NEGATIVE
Candida Vaginitis: POSITIVE — AB
Chlamydia: NEGATIVE
Comment: NEGATIVE
Comment: NEGATIVE
Comment: NEGATIVE
Comment: NEGATIVE
Comment: NEGATIVE
Comment: NORMAL
Neisseria Gonorrhea: NEGATIVE
Trichomonas: NEGATIVE

## 2023-02-23 MED ORDER — FLUCONAZOLE 150 MG PO TABS: 150.0000 mg | ORAL_TABLET | Freq: Once | ORAL | 0 refills | Status: AC

## 2023-02-23 NOTE — Addendum Note (Signed)
Addended by: Jaynie Collins A on: 02/23/2023 12:48 PM   Modules accepted: Orders

## 2023-02-26 LAB — CYTOLOGY - PAP
Adequacy: ABSENT
Chlamydia: NEGATIVE
Comment: NEGATIVE
Comment: NEGATIVE
Comment: NORMAL
Diagnosis: NEGATIVE
High risk HPV: NEGATIVE
Neisseria Gonorrhea: NEGATIVE

## 2023-03-01 ENCOUNTER — Telehealth: Payer: Self-pay

## 2023-03-01 NOTE — Telephone Encounter (Signed)
Pt is requesting at least two more tablets of Diflucan as that is usually what it takes to treat her yeast infection.   Leonette Nutting

## 2023-03-02 MED ORDER — FLUCONAZOLE 150 MG PO TABS: 150.0000 mg | ORAL_TABLET | Freq: Once | ORAL | 1 refills | Status: AC

## 2023-03-02 NOTE — Addendum Note (Signed)
Addended by: Faythe Casa on: 03/02/2023 09:56 AM   Modules accepted: Orders

## 2023-03-02 NOTE — Telephone Encounter (Signed)
Messaged left that the medication that she requested has been sent to her [facility].  If she continues to have questions or concerns to please contact the office.    Leonette Nutting

## 2023-03-19 ENCOUNTER — Encounter: Payer: Self-pay | Admitting: Family Medicine

## 2023-03-19 DIAGNOSIS — Z3042 Encounter for surveillance of injectable contraceptive: Secondary | ICD-10-CM

## 2023-03-20 ENCOUNTER — Other Ambulatory Visit: Payer: Self-pay

## 2023-03-21 MED ORDER — MEDROXYPROGESTERONE ACETATE 150 MG/ML IM SUSP: 150.0000 mg | INTRAMUSCULAR | 3 refills | Status: DC

## 2023-03-27 ENCOUNTER — Encounter: Payer: Self-pay | Admitting: Neurology

## 2023-03-28 MED ORDER — BACLOFEN 20 MG PO TABS: ORAL_TABLET | ORAL | 11 refills | Status: DC

## 2023-03-28 MED ORDER — DANTROLENE SODIUM 100 MG PO CAPS: ORAL_CAPSULE | ORAL | 3 refills | Status: AC

## 2023-03-28 NOTE — Telephone Encounter (Signed)
Meds ordered this encounter  Medications   baclofen (LIORESAL) 20 MG tablet    Sig: Take 2 tablets 4 times daily    Dispense:  240 tablet    Refill:  11   dantrolene (DANTRIUM) 100 MG capsule    Sig: TAKE 1 CAPSULE BY MOUTH 3 TIMES DAILY.    Dispense:  270 capsule    Refill:  3

## 2023-04-09 ENCOUNTER — Other Ambulatory Visit: Payer: Self-pay

## 2023-04-10 ENCOUNTER — Ambulatory Visit (HOSPITAL_BASED_OUTPATIENT_CLINIC_OR_DEPARTMENT_OTHER): Payer: Medicare Other | Admitting: Pulmonary Disease

## 2023-05-07 ENCOUNTER — Ambulatory Visit (HOSPITAL_BASED_OUTPATIENT_CLINIC_OR_DEPARTMENT_OTHER): Payer: Medicare Other | Admitting: Pulmonary Disease

## 2023-06-08 ENCOUNTER — Encounter (HOSPITAL_BASED_OUTPATIENT_CLINIC_OR_DEPARTMENT_OTHER): Payer: Self-pay | Admitting: Pulmonary Disease

## 2023-06-08 ENCOUNTER — Ambulatory Visit (INDEPENDENT_AMBULATORY_CARE_PROVIDER_SITE_OTHER): Payer: Medicare Other | Admitting: Pulmonary Disease

## 2023-06-08 VITALS — BP 108/60 | HR 73 | Temp 98.8°F | Ht 62.0 in | Wt 130.0 lb

## 2023-06-08 DIAGNOSIS — J9611 Chronic respiratory failure with hypoxia: Secondary | ICD-10-CM

## 2023-06-08 DIAGNOSIS — Z930 Tracheostomy status: Secondary | ICD-10-CM | POA: Diagnosis not present

## 2023-06-08 DIAGNOSIS — G4730 Sleep apnea, unspecified: Secondary | ICD-10-CM | POA: Diagnosis not present

## 2023-06-08 NOTE — Progress Notes (Signed)
[facility]  Chief Complaint  Patient presents with   Follow-up    Having SOB at times.  Patient boyfriend is adjusting her trach and that is helping her breathing.  Patient uses [facility] Vent.    Past Surgical History:  She  has a past surgical history that includes Ileostomy (2006); Esophagogastroduodenoscopy; Spine surgery (6213,0865); Tracheostomy (2000); Gastrostomy tube placement (2000); Bronchoscopy (2006); Throat surgery (2004); Wisdom tooth extraction (2008); Bladder surgery (7846,9629); Tendon repair (2012); Removal of gastrostomy tube (2000); Cholecystectomy (11/18/2012); wirst surgery (Left); and Microlaryngoscopy with laser and balloon dilation (N/A, 09/27/2016).  Past Medical History:  CVA with brain stem involvement, Quadriplegia, Depression, Neurogenic bladder, Recurrent UTI, GERD, Scoliosis  Constitutional:  BP 108/60 (BP Location: Left Arm, Patient Position: Sitting, Cuff Size: Normal)   Pulse 73   Temp 98.8 F (37.1 C) (Oral)   Ht 5\' 2"  (1.575 m) Comment: verbal.  patient unable to stand  Wt 130 lb (59 kg) Comment: verbal. Patient unable to stand  SpO2 99%   BMI 23.78 kg/m   Brief Summary:  Veronica Huff is a 37 y.o. female withchronic respiratory failure and sleep disordered breathing 2nd to brain stem infarct at age 63 with resulting quadriplegia.  She has chronic tracheostomy with nocturnal ventilator support.      Subjective:   She is here with her aide, Basco Sink.  Her home aide went on vacation.  Her boyfriend wasn't familiar with how to use her nebulizer.  She had trouble with her breathing.  Once she started using her nebulizer on regular basis again, her breathing got better.  Hasn't need antibiotics for a while.  Using NIV nightly.     Physical Exam:   Appearance - sitting in motorized wheelchair  ENMT - no sinus tenderness, no oral exudate, no LAN, Mallampati 2 airway, no stridor, trach site  clean  Respiratory - equal breath sounds bilaterally, no wheezing or rales  CV - s1s2 regular rate and rhythm, no murmurs  Ext - no clubbing, no edema  Skin - no rashes  Psych - normal mood and affect     Sleep Tests:  ONO with home vent 10/12/16 >> test time 3 hrs 51 min.  Mean SpO2 92.9%, low SpO2 74%.  Spent 14 min with SpO2 < 88%.  Social History:  She  reports that she has never smoked. She has never used smokeless tobacco. She reports that she does not drink alcohol and does not use drugs.  Family History:  Her family history includes Cancer in her maternal aunt; Diabetes in her maternal grandfather and mother; Heart disease in her maternal grandfather and paternal grandfather; Hypertension in her mother; Other in her father and sister; Varicose Veins in her mother.     Assessment/Plan:   Recurrent lower respiratory infections. - most recent sputum culture grew Pseudomonas and Stenotrophomonas - she has multiple antibiotic allergies; seen by immunology to desensitize to penicillin - seen by Dr. Odette Fraction with ID in August 2023; follow up if she has recurrent resistant respiratory infection  Sleep disordered breathing with hx of obstructive sleep apnea. Chronic hypoxic respiratory failure. Lung collapse. - continue Astral home vent SIMV rate 7, Vt 350, PS 10, PEEP 5, I time 1, High pressure alarm 45 - gets supplies from [facility] - she might need more home nursing care if her respiratory infections starting impacting her respiratory status more - prn albuterol  Tracheostomy status. - followed by Dr. 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 - followed by Dr. Levert Feinstein with [facility]   Neurogenic bladder. - followed by [facility]rology at [facility]  Time Spent Involved in Patient Care on Day of Examination:  25 minutes  Follow up:   Patient  Instructions  Follow up in 6 months  Medication List:   Allergies as of 06/08/2023       Reactions   Bee Venom Shortness Of Breath, Swelling, Anaphylaxis   SHOCK   Ciprofloxacin Hives, Rash   Tobramycin Hives, Rash   Erythromycin Other (See Comments)   UNSPECIFIED REACTION    Trazodone Other (See Comments)   UNSPECIFIED REACTION  Eyes shake   Trazodone And Nefazodone    UNSPECIFIED REACTION    Morphine And Codeine Other (See Comments)   "GOES CRAZY"   Vantin [cefpodoxime] Palpitations        Medication List        Accurate as of June 08, 2023  4:13 PM. If you have any questions, ask your nurse or doctor.          albuterol 108 (90 Base) MCG/ACT inhaler Commonly known as: VENTOLIN HFA Inhale into the lungs.   albuterol (2.5 MG/3ML) 0.083% nebulizer solution Commonly known as: PROVENTIL Take 3 mLs (2.5 mg total) by nebulization every 6 (six) hours as needed for wheezing or shortness of breath.   amoxicillin 400 MG/5ML suspension Commonly known as: AMOXIL DO NOT TAKE THIS MEDICATION- FOR IN CLINIC DRUG CHALLENGE ONLY. BRING TO YOUR APPOINTMENT ON MONDAY   amoxicillin-clavulanate 250-62.5 MG/5ML suspension Commonly known as: Augmentin DO NOT TAKE THIS MEDICATION. FOR IN OFFICE DRUG CHALLENGE ONLY. BRING TO YOUR APPOINTMENT   baclofen 20 MG tablet Commonly known as: LIORESAL Take 2 tablets 4 times daily   busPIRone 5 MG tablet Commonly known as: BUSPAR Take 5 mg by mouth 3 (three) times daily.   cephALEXin 250 MG capsule Commonly known as: KEFLEX Take 250 mg by mouth daily.   dantrolene 100 MG capsule Commonly known as: DANTRIUM TAKE 1 CAPSULE BY MOUTH 3 TIMES DAILY.   diphenhydrAMINE 25 MG tablet Commonly known as: BENADRYL Take 1 tablet (25 mg total) by mouth every 6 (six) hours as needed for itching or allergies. Take with Levofloxacin to prevent allergic reaction   DSS 100 MG Caps Take 100 mg by mouth 2 (two) times daily with a meal.   EpiPen  2-Pak 0.3 MG/0.3ML Soaj injection Generic drug: EPINEPHrine Inject 0.3 mg into the muscle daily as needed for anaphylaxis.   fluticasone 50 MCG/ACT nasal spray Commonly known as: FLONASE INSERT 2 SPRAYS IN EACH NOSTRIL EVERY DAY FOR STUFFY NOSE OR DRAINAGE   gabapentin 300 MG capsule Commonly known as: NEURONTIN Take 300 mg by mouth 3 (three) times daily.   guaiFENesin 600 MG 12 hr tablet Commonly known as: MUCINEX Take 600 mg by mouth as needed.   hydrocortisone cream 0.5 % Apply 1 application topically 2 (two) times daily. To hairline and brows   ibuprofen 200 MG tablet Commonly known as: ADVIL Take 400 mg by mouth every 6 (six) hours as needed for fever, headache, mild pain, moderate pain or cramping.   imipramine 50 MG tablet Commonly known as: TOFRANIL TAKE TWO TABLETS BY MOUTH AT BEDTIME   ketorolac 0.5 % ophthalmic solution Commonly known as: ACULAR Place 1 drop into both eyes daily.   lactulose 10 GM/15ML solution  Commonly known as: CHRONULAC Take 10 g by mouth daily as needed for mild constipation.   levocetirizine 5 MG tablet Commonly known as: XYZAL TAKE 1 TABLET BY MOUTH EVERY DAY AS NEEDED   medroxyPROGESTERone 150 MG/ML injection Commonly known as: DEPO-PROVERA Inject 1 mL (150 mg total) into the muscle every 3 (three) months.   METAMUCIL FIBER PO Take by mouth.   metoprolol succinate 25 MG 24 hr tablet Commonly known as: TOPROL-XL Take 1 tablet (25 mg total) by mouth in the morning and at bedtime.   multivitamin with minerals Tabs tablet Take 1 tablet by mouth daily.   mupirocin ointment 2 % Commonly known as: BACTROBAN 1 application by Tracheal Tube route daily. Applies to trach-stoma site   nystatin powder Commonly known as: MYCOSTATIN/NYSTOP Apply 1 application topically 2 (two) times daily as needed (skin irritation). Small amount . Rash   polyethylene glycol powder 17 GM/SCOOP powder Commonly known as: MiraLax Start taking 1 capful 3  times a day. Slowly cut back as needed until you have normal bowel movements.   Promethegan 12.5 MG suppository Generic drug: promethazine Place 12.5 mg rectally as needed.   pseudoephedrine 120 MG 12 hr tablet Commonly known as: SUDAFED Take 120 mg by mouth as needed.   psyllium 95 % Pack Commonly known as: HYDROCIL/METAMUCIL Take 1 packet by mouth daily.   senna 8.6 MG tablet Commonly known as: SENOKOT Take 1 tablet by mouth every other day.   sertraline 100 MG tablet Commonly known as: ZOLOFT Take 200 mg by mouth daily.   sodium chloride irrigation 0.9 % irrigation Flush with 50 mls up to 6 times a day as directed   solifenacin 10 MG tablet Commonly known as: VESICARE Take 10 mg by mouth daily.   sucralfate 1 GM/10ML suspension Commonly known as: CARAFATE Take 1 g by mouth 3 (three) times daily.   topiramate 50 MG tablet Commonly known as: TOPAMAX Take 1 tablet (50 mg total) by mouth at bedtime.        Signature:  Coralyn Helling, MD [facility]/Critical Care Pager - [phone] 06/08/2023, 4:13 PM

## 2023-06-08 NOTE — Patient Instructions (Signed)
Follow up in 6 months 

## 2023-06-18 ENCOUNTER — Telehealth: Payer: Self-pay | Admitting: Neurology

## 2023-06-18 NOTE — Telephone Encounter (Signed)
Pt called needing to speak to the RN regarding her she has in August and to discuss her medications. Please advise.

## 2023-06-18 NOTE — Telephone Encounter (Signed)
Patient calling to report that her day nurse has quit and while she is getting a new one, she does not think she will be able to come into the office for her 08.08.24 appointment. She is hoping to get it switched to a virtual apointment with Dr. Terrace Arabia. I advised that Dr. Terrace Arabia is not in office and will return on Wednesday and patient was agreeable to waiting, she also states she will need a refill on her baclofen the day before appointment, I will pend the meds so it can be reviewed and signed at time of review.

## 2023-06-20 MED ORDER — BACLOFEN 20 MG PO TABS: ORAL_TABLET | ORAL | 2 refills | Status: DC

## 2023-06-20 NOTE — Telephone Encounter (Signed)
Phone room: Please notify pt baclofen refilled and appt switched to vv as requested.  Thanks,  Production assistant, radio

## 2023-06-20 NOTE — Telephone Encounter (Signed)
Meds ordered this encounter  Medications   baclofen (LIORESAL) 20 MG tablet    Sig: Take 2 tablets 4 times daily    Dispense:  240 tablet    Refill:  2     Ok to switch her app to virtual

## 2023-06-28 ENCOUNTER — Telehealth: Payer: Self-pay | Admitting: Neurology

## 2023-06-28 ENCOUNTER — Telehealth (INDEPENDENT_AMBULATORY_CARE_PROVIDER_SITE_OTHER): Payer: Medicare Other | Admitting: Neurology

## 2023-06-28 ENCOUNTER — Telehealth: Payer: Medicare Other | Admitting: Neurology

## 2023-06-28 ENCOUNTER — Encounter: Payer: Self-pay | Admitting: Neurology

## 2023-06-28 VITALS — BP 110/51 | HR 100 | Ht 62.0 in | Wt 130.0 lb

## 2023-06-28 DIAGNOSIS — I639 Cerebral infarction, unspecified: Secondary | ICD-10-CM

## 2023-06-28 DIAGNOSIS — I69353 Hemiplegia and hemiparesis following cerebral infarction affecting right non-dominant side: Secondary | ICD-10-CM | POA: Insufficient documentation

## 2023-06-28 DIAGNOSIS — G8250 Quadriplegia, unspecified: Secondary | ICD-10-CM | POA: Diagnosis not present

## 2023-06-28 NOTE — Progress Notes (Signed)
Chief Complaint  Patient presents with   Follow-up    Virtual Visit: Spastic tetraplegia with rigidity syndrome, Cerebrovascular accident (CVA), unspecified mechanism, Hemiplegia and hemiparesis following cerebral infarction affecting right non-dominant side: pt stated that, "I'm doing really well, doing better now Eldridge Dace is in the presidential race."         ASSESSMENT AND PLAN  Veronica Huff is a 37 y.o. female   Cryptogenic brainstem stroke at age 28, Status post tracheostomy, using ventilator at nighttime, Spastic quadriplegia, Previously attempted botulism toxin injection without helping her symptoms  Currently getting baclofen 20 mg 4 times a day, dantrolene 100 mg 3 times a day from our office, also on polypharmacy of gabapentin, Zoloft, Topamax,    DIAGNOSTIC DATA (LABS, IMAGING, TESTING) - I reviewed patient records, labs, notes, testing and imaging myself where available.   MEDICAL HISTORY:  Veronica Huff is a 37 year old female, suffered a brainstem stroke at age 24 years old, with residual spastic quadriplegia, neurogenic bladder.  She is accompanied by her boyfriend, I saw her initially in January 2019 referred by my colleague Dr. Marjory Lies for EMG guided botulinum toxin injection for spastic quadriplegia.   She suffered catastrophic brainstem stroke, cryptogenic at age 77, required intubation, ventilation support, ICU stay, extensive rehabilitation, she regained marked improvement, but become wheelchair-bound with significant quadriplegia,   She also has tracheostomy due to weak cough, she will use ventilator at nighttime, require   suctioning through her tracheostomy.  She has bowel suppositories every other day, was regular bowel regimen, she had a suprapubic catheter.   She lives at home, has caregiver 90 hours each week, her boyfriend moved in with her since 2018, she need total assistance, feeding, dressing.  But she can operate her wheelchair using a  mouthpiece, enjoying her computer work and reading.   Previously she got Botox injection through rehabilitation Dr. Riley Kill, most recent injection was in April 2018, for spastic bilateral lower extremity, use the Botox a.   She complains of bilateral lower extremity with sudden positional change, jackknife contraction of bilateral thigh muscle, uncontrollable muscle jumpy movement.  She does use her upper extremity to gesture when she talks.   MRI of the brain in May 2017, abnormal configuration of the medulla that is related to her previous history of stroke, there is prominence of the CSF space adjacent to the frontal lobe likely represent mild atrophy, no acute abnormality.  She was receiving Botox injection for lower extremity spasticity intermittently since 2019 visit, helping her sometimes but not persistently, had few virtual visit during pandemic  She returns today wants to resume botulism toxin injection for lower extremity spasticity, reporting lower extremity distress, to the point of leading to painful low back spasm, lower extremity uncontrollable jerking movement   Virtual Visit via video UPDATE June 28, 2023 I discussed the limitations of evaluation and management by telemedicine and the availability of in person appointments. The patient expressed understanding and agreed to proceed  Location: Provider: [facility] office; Patient: Home  I connected with Veronica Huff  on June 28, 2023 by a video enabled telemedicine application and verified that I am speaking with the correct person using two identifiers.  UPDATED HiSTORY She is overall stable, last trial of botulism toxin injection for bilateral lower extremity spasticity did not help her much," actually made things worse", she is not taking baclofen 20 mg 4 times a day, sertraline 100 mg 3 times a day, prescriptions from our office,   Observations/Objective: I  have reviewed problem lists, medications, allergies.  Patient  lying in bed, minimum 4 limb movement, facial symmetric, mild hoarse voice,  PHYSICAL EXAM:   Vitals:   06/28/23 1050  BP: (!) 110/51  Pulse: 100  Weight: 130 lb (59 kg)  Height: 5\' 2"  (1.575 m)     Body mass index is 23.78 kg/m.   REVIEW OF SYSTEMS:  Full 14 system review of systems performed and notable only for as above All other review of systems were negative.   ALLERGIES: Allergies  Allergen Reactions   Bee Venom Shortness Of Breath, Swelling and Anaphylaxis    SHOCK   Ciprofloxacin Hives and Rash   Tobramycin Hives and Rash   Erythromycin Other (See Comments)    UNSPECIFIED REACTION     Trazodone Other (See Comments)    UNSPECIFIED REACTION  Eyes shake   Trazodone And Nefazodone     UNSPECIFIED REACTION    Morphine And Codeine Other (See Comments)    "GOES CRAZY"    Vantin [Cefpodoxime] Palpitations    HOME MEDICATIONS: Current Outpatient Medications  Medication Sig Dispense Refill   albuterol (PROVENTIL) (2.5 MG/3ML) 0.083% nebulizer solution Take 3 mLs (2.5 mg total) by nebulization every 6 (six) hours as needed for wheezing or shortness of breath. 360 mL 5   albuterol (VENTOLIN HFA) 108 (90 Base) MCG/ACT inhaler Inhale into the lungs.     baclofen (LIORESAL) 20 MG tablet Take 2 tablets 4 times daily 240 tablet 2   cephALEXin (KEFLEX) 250 MG capsule Take 250 mg by mouth daily.     cetirizine (ZYRTEC) 10 MG tablet Take 10 mg by mouth daily.     dantrolene (DANTRIUM) 100 MG capsule TAKE 1 CAPSULE BY MOUTH 3 TIMES DAILY. 270 capsule 3   diphenhydrAMINE (BENADRYL) 25 MG tablet Take 1 tablet (25 mg total) by mouth every 6 (six) hours as needed for itching or allergies. Take with Levofloxacin to prevent allergic reaction 30 tablet 0   Docusate Sodium (DSS) 100 MG CAPS Take 100 mg by mouth 2 (two) times daily with a meal.     EPIPEN 2-PAK 0.3 MG/0.3ML SOAJ injection Inject 0.3 mg into the muscle daily as needed for anaphylaxis.  1   fluticasone (FLONASE) 50  MCG/ACT nasal spray INSERT 2 SPRAYS IN EACH NOSTRIL EVERY DAY FOR STUFFY NOSE OR DRAINAGE 16 g 5   gabapentin (NEURONTIN) 300 MG capsule Take 300 mg by mouth 3 (three) times daily.     guaiFENesin (MUCINEX) 600 MG 12 hr tablet Take 600 mg by mouth as needed.     hydrocortisone cream 0.5 % Apply 1 application topically 2 (two) times daily. To hairline and brows     hydrOXYzine (ATARAX) 50 MG tablet Take 50 mg by mouth 2 (two) times daily.     ibuprofen (ADVIL) 200 MG tablet Take 400 mg by mouth every 6 (six) hours as needed for fever, headache, mild pain, moderate pain or cramping.     imipramine (TOFRANIL) 50 MG tablet TAKE TWO TABLETS BY MOUTH AT BEDTIME 60 tablet 10   ketorolac (ACULAR) 0.5 % ophthalmic solution Place 1 drop into both eyes daily.      lactulose (CHRONULAC) 10 GM/15ML solution Take 10 g by mouth daily as needed for mild constipation.     medroxyPROGESTERone (DEPO-PROVERA) 150 MG/ML injection Inject 1 mL (150 mg total) into the muscle every 3 (three) months. 1 mL 3   metoprolol succinate (TOPROL-XL) 25 MG 24 hr tablet Take 1 tablet (25  mg total) by mouth in the morning and at bedtime. 180 tablet 3   Multiple Vitamin (MULTIVITAMIN WITH MINERALS) TABS tablet Take 1 tablet by mouth daily.     mupirocin ointment (BACTROBAN) 2 % 1 application by Tracheal Tube route daily. Applies to trach-stoma site     nystatin (MYCOSTATIN) powder Apply 1 application topically 2 (two) times daily as needed (skin irritation). Small amount . Rash     polyethylene glycol powder (MIRALAX) 17 GM/SCOOP powder Start taking 1 capful 3 times a day. Slowly cut back as needed until you have normal bowel movements. 255 g 0   PROMETHEGAN 12.5 MG suppository Place 12.5 mg rectally as needed.     pseudoephedrine (SUDAFED) 120 MG 12 hr tablet Take 120 mg by mouth as needed.     senna (SENOKOT) 8.6 MG tablet Take 1 tablet by mouth every other day.     sertraline (ZOLOFT) 100 MG tablet Take 200 mg by mouth daily.      sodium chloride irrigation 0.9 % irrigation Flush with 50 mls up to 6 times a day as directed 5000 mL 3   solifenacin (VESICARE) 10 MG tablet Take 10 mg by mouth daily.     sucralfate (CARAFATE) 1 GM/10ML suspension Take 1 g by mouth 3 (three) times daily.      topiramate (TOPAMAX) 50 MG tablet Take 1 tablet (50 mg total) by mouth at bedtime. 90 tablet 3   Current Facility-Administered Medications  Medication Dose Route Frequency Provider Last Rate Last Admin   incobotulinumtoxinA (XEOMIN) 100 units injection 400 Units  400 Units Intramuscular Q90 days Levert Feinstein, MD   400 Units at 10/28/22 2122    PAST MEDICAL HISTORY: Past Medical History:  Diagnosis Date   Anxiety    CVA (cerebral infarction)    Ischemic brainstem stroke in 2000 at age 69 due to unknown reasons--quadriplegic after stroke.   Depression    Zoloft has helped-not having any current depression   Edema extremities    Feet and legs-mostly in feet   Headache(784.0)    Prior to imipramine-none currently   Pneumonia    Quadriplegia (HCC)    Secondary to stroke at age 58-has trach-lives with boyfriend-motorized w/c-joystick to chin to drive w/c--I & O cath thru suprapubic stoma - has nursing care 16 hrs daily and boyfriend able to help pt the other hours of day.   Recurrent UTI (urinary tract infection)    Due to suprapubic stoma, repetitive catheterizations   Sleep apnea    On ventilator at night -has oxygen but rarely needs unless she has a cold and sats drop   Stroke [facility])     PAST SURGICAL HISTORY: Past Surgical History:  Procedure Laterality Date   BLADDER SURGERY  2009,2012   BRONCHOSCOPY  2006   CHOLECYSTECTOMY  11/18/2012   Procedure: LAPAROSCOPIC CHOLECYSTECTOMY;  Surgeon: Axel Filler, MD;  Location: [facility];  Service: General;  Laterality: N/A;   ESOPHAGOGASTRODUODENOSCOPY     GASTROSTOMY TUBE PLACEMENT  2000   ILEOSTOMY  2006   MICROLARYNGOSCOPY WITH LASER AND BALLOON DILATION N/A 09/27/2016    Procedure: MICROLARYNGOSCOPY WITH LASER AND BALLOON DILATION;  Surgeon: Christia Reading, MD;  Location: [facility] OR;  Service: ENT;  Laterality: N/A;  micro direct laryngoscopy with co2 laser balloon dilation of trachea   REMOVAL OF GASTROSTOMY TUBE  2000   SPINE SURGERY  2004,2011   TENDON REPAIR  2012   Left wrist   THROAT SURGERY  2004   TRACHEOSTOMY  2000   wirst surgery Left    WISDOM TOOTH EXTRACTION  2008    FAMILY HISTORY: Family History  Problem Relation Age of Onset   Heart disease Paternal Grandfather    Diabetes Maternal Grandfather    Heart disease Maternal Grandfather    Other Father        tumor removed from kidney in 2024   Diabetes Mother    Hypertension Mother    Varicose Veins Mother    Other Sister        overdose   Cancer Maternal Aunt        lung    SOCIAL HISTORY: Social History   Socioeconomic History   Marital status: Significant Other    Spouse name: Not on file   Number of children: 0   Years of education: college    Highest education level: Associate degree: occupational, Scientist, product/process development, or vocational program  Occupational History   Occupation: disabled  Tobacco Use   Smoking status: Never   Smokeless tobacco: Never  Vaping Use   Vaping status: Never Used  Substance and Sexual Activity   Alcohol use: No    Alcohol/week: 0.0 standard drinks of alcohol   Drug use: No    Types: Marijuana   Sexual activity: Yes    Birth control/protection: Injection  Other Topics Concern   Not on file  Social History Narrative   08/29/17 Lives at home with boyfriend, Has a Environmental manager that helps to care for her    Drinks no caffeine    Social Determinants of Corporate investment banker Strain: Not on file  Food Insecurity: Not on file  Transportation Needs: Not on file  Physical Activity: Not on file  Stress: Not on file  Social Connections: Not on file  Intimate Partner Violence: Not on file      Levert Feinstein, M.D. Ph.D.  [facility] [address], [address] [place], [place] [address] Ph: [phone] Fax: [phone]  CC:  Nona Dell, NP [address] [place],  [place] [address]  Schmerge, Belenda Cruise, NP

## 2023-06-28 NOTE — Telephone Encounter (Signed)
LVM and sent mychart msg informing pt of need to reschedule 06/28/23 appt - inclement weather

## 2023-07-03 ENCOUNTER — Other Ambulatory Visit: Payer: Self-pay | Admitting: Urology

## 2023-07-09 ENCOUNTER — Ambulatory Visit: Payer: Medicare Other | Admitting: Family Medicine

## 2023-07-09 ENCOUNTER — Other Ambulatory Visit: Payer: Self-pay

## 2023-07-09 ENCOUNTER — Encounter: Payer: Self-pay | Admitting: Family Medicine

## 2023-07-09 VITALS — BP 110/60 | HR 79 | Temp 97.7°F | Resp 16

## 2023-07-09 DIAGNOSIS — B999 Unspecified infectious disease: Secondary | ICD-10-CM

## 2023-07-09 DIAGNOSIS — H6090 Unspecified otitis externa, unspecified ear: Secondary | ICD-10-CM | POA: Insufficient documentation

## 2023-07-09 DIAGNOSIS — Z889 Allergy status to unspecified drugs, medicaments and biological substances status: Secondary | ICD-10-CM

## 2023-07-09 DIAGNOSIS — H60391 Other infective otitis externa, right ear: Secondary | ICD-10-CM | POA: Diagnosis not present

## 2023-07-09 MED ORDER — HYDROCORTISONE-ACETIC ACID 1-2 % OT SOLN: 4.0000 [drp] | Freq: Three times a day (TID) | OTIC | 0 refills | Status: AC

## 2023-07-09 NOTE — Patient Instructions (Addendum)
Drug allergy Not well controlled Lets get an oral challenge set up to ciprofloxacin.  The medicine will be available at your pharmacy.  Bring this medication to your oral challenge in the clinic.  Recurrent infections Moderately well controlled Keep track of infections, antibiotic use and steroid use Conider immune screening with any increase in infection  Otitis externa Acute Begin acetic acid-hydrocortisone drops in the right ear three times a day for 7 days  Call the clinic if this treatment plan is not working well for you.  Follow up in 1-2 months for ciprofloxacin challenge

## 2023-07-09 NOTE — Progress Notes (Signed)
[address] [place] [address] Dept: [phone]  FOLLOW UP NOTE  Patient ID: Veronica Huff, female    DOB: [birth year]  Age: 37 y.o. MRN: [id number] Date of Office Visit: 07/09/2023  Assessment  Chief Complaint: Follow-up  HPI Veronica Huff is a 37 year old female who presents to the clinic for follow-up visit.  She was last seen in this clinic on 01/15/2023 for a successful penicillin oral challenge.  Prior to that she was seen in this clinic on 12/04/2018 for by Dr. Selena Batten as a new patient for evaluation of drug allergy to amoxicillin, ciprofloxacin, and tobramycin.  At today's visit, she reports that she has not had any infections requiring antibiotics or steroid since her last visit to this clinic. She reports that she is interested in a ciprofloxacin oral drug challenge in the next few months.   She reports that, on Wednesday, she got water in her right ear while taking a shower after which she began to develop intermittent right ear pain. She reports that she had her boyfriend pour hydrogen peroxide in her right ear on Friday to relieve the pain and wash out her ear. She reports that this is not her usual practice. At today's visit, she reports that she continues to feel a dull aching intermittent pain and itch. She denies ear drainage, odor from her ear, or change in hearing.   Her current medications are listed in the chart  Drug Allergies:  Allergies  Allergen Reactions   Bee Venom Shortness Of Breath, Swelling and Anaphylaxis    SHOCK   Ciprofloxacin Hives and Rash   Tobramycin Hives and Rash   Erythromycin Other (See Comments)    UNSPECIFIED REACTION     Trazodone Other (See Comments)    UNSPECIFIED REACTION  Eyes shake   Trazodone And Nefazodone     UNSPECIFIED REACTION    Morphine And Codeine Other (See Comments)    "GOES CRAZY"    Vantin [Cefpodoxime] Palpitations    Physical Exam: BP 110/60   Pulse 79   Temp 97.7 F (36.5 C) (Temporal)   Resp 16    SpO2 99%    Physical Exam Vitals reviewed.  Constitutional:      Appearance: Normal appearance.  HENT:     Head: Normocephalic and atraumatic.     Right Ear: Tympanic membrane normal.     Left Ear: Tympanic membrane normal.     Mouth/Throat:     Pharynx: Oropharynx is clear.     Comments: Bilateral naris slightly erythematous with thin clear nasal drainage noted.  Pharynx normal.  Eyes normal.  Left ear normal.  Right ear canal erythematous.  Right TM normal.  No scabs noted in ear canal.  Small amount of soft cerumen noted in the right ear canal. Eyes:     Conjunctiva/sclera: Conjunctivae normal.  Cardiovascular:     Rate and Rhythm: Normal rate and regular rhythm.     Heart sounds: Normal heart sounds. No murmur heard. Pulmonary:     Effort: Pulmonary effort is normal.     Comments: Scattered rhonchi noted. Skin:    General: Skin is warm and dry.  Neurological:     Mental Status: She is alert and oriented to person, place, and time.  Psychiatric:        Mood and Affect: Mood normal.        Behavior: Behavior normal.        Thought Content: Thought content normal.  Judgment: Judgment normal.     Assessment and Plan: 1. Multiple drug allergies   2. Recurrent infections   3. Other infective acute otitis externa of right ear     Meds ordered this encounter  Medications   acetic acid-hydrocortisone (VOSOL-HC) OTIC solution    Sig: Place 4 drops into the right ear 3 (three) times daily for 7 days.    Dispense:  10 mL    Refill:  0    Patient Instructions  Drug allergy Not well controlled Lets get an oral challenge set up to ciprofloxacin.  The medicine will be available at your pharmacy.  Bring this medication to your oral challenge in the clinic.  Recurrent infections Moderately well controlled Keep track of infections, antibiotic use and steroid use Conider immune screening with any increase in infection  Otitis externa Acute Begin acetic  acid-hydrocortisone drops in the right ear three times a day for 7 days  Call the clinic if this treatment plan is not working well for you.  Follow up in 1-2 months for ciprofloxacin challenge  Return in about 2 months (around 09/08/2023), or if symptoms worsen or fail to improve.    Thank you for the opportunity to care for this patient.  Please do not hesitate to contact me with questions.  Thermon Leyland, FNP [facility]

## 2023-07-12 ENCOUNTER — Other Ambulatory Visit (HOSPITAL_COMMUNITY): Payer: Self-pay

## 2023-07-12 ENCOUNTER — Telehealth: Payer: Self-pay

## 2023-07-12 NOTE — Telephone Encounter (Signed)
Pharmacy Patient Advocate Encounter   Received notification from CoverMyMeds that prior authorization for Hydrocortisone-Acetic Acid 1-2% solution is required/requested.   Insurance verification completed.   The patient is insured through [facility] [place] .   Per test claim: PA required; PA submitted to [facility] Medicaid via CoverMyMeds Key/confirmation #/EOC [id number] Status is pending

## 2023-07-13 NOTE — Telephone Encounter (Signed)
Pharmacy Patient Advocate Encounter  Received notification from [facility] Medicaid that Prior Authorization for Hydrocortisone-Acetic Acid 1-2% solution has been DENIED  We denied this request under Medicare Part D because: This drug is not covered on the formulary. We are denying your request because we do not show that you have tried neomycin-polymyxin-hc otic (ear) solution 3.5-100,000-1 mg/ml-unit/ml-%. This covered drug is indicated to treat your condition. We may be able to make an exception to cover this drug. Your doctor will need to send [place] medical records showing that you tried this drug. If you cannot take the covered drug, your doctor will need to tell us why. Note: Some covered drug(s) may have quantity limits. Please refer to the formulary for details.  PA #/Case ID/Reference #: [id number]  Please be advised we currently do not have a Pharmacist to review denials, therefore you will need to process appeals accordingly as needed. Thanks for your support at this time. Contact for appeals are as follows: Phone: [phone], Fax: [phone]

## 2023-07-13 NOTE — Telephone Encounter (Signed)
Do you want to switch to this instead? Neomycin-polymyxin-hc otic (ear) solution 3.5-100,000-1 mg/ml-unit/ml-% ?

## 2023-07-16 ENCOUNTER — Other Ambulatory Visit: Payer: Self-pay | Admitting: Family Medicine

## 2023-07-16 NOTE — Telephone Encounter (Signed)
Allergy contradiction. Please appeal. Thank you

## 2023-07-19 NOTE — Telephone Encounter (Signed)
Appeal and office notes has been faxed to [phone]. Form has been placed in the pending tray in the nurses station.

## 2023-08-06 ENCOUNTER — Telehealth: Payer: Self-pay | Admitting: Family Medicine

## 2023-08-06 NOTE — Telephone Encounter (Signed)
Patient has an upcoming appt on 09/10/23 for a Ciprofloxacin challenge. Please advise on what to send in for patient.

## 2023-08-06 NOTE — Telephone Encounter (Signed)
Patient would like a call back regarding how to receive medication for Drug Challenge on 09/10/23. Please advise a good call back number is [phone].

## 2023-08-09 ENCOUNTER — Encounter (HOSPITAL_COMMUNITY): Payer: Self-pay

## 2023-08-09 ENCOUNTER — Other Ambulatory Visit: Payer: Self-pay

## 2023-08-09 ENCOUNTER — Emergency Department (HOSPITAL_COMMUNITY)
Admission: EM | Admit: 2023-08-09 | Discharge: 2023-08-09 | Disposition: A | Discharge status: Home / Self Care | Payer: Medicare Other | Attending: Emergency Medicine | Admitting: Emergency Medicine

## 2023-08-09 ENCOUNTER — Emergency Department (HOSPITAL_COMMUNITY): Payer: Medicare Other

## 2023-08-09 DIAGNOSIS — L988 Other specified disorders of the skin and subcutaneous tissue: Secondary | ICD-10-CM

## 2023-08-09 DIAGNOSIS — D696 Thrombocytopenia, unspecified: Secondary | ICD-10-CM | POA: Diagnosis not present

## 2023-08-09 DIAGNOSIS — K5900 Constipation, unspecified: Secondary | ICD-10-CM | POA: Insufficient documentation

## 2023-08-09 LAB — I-STAT CHEM 8, ED
BUN: 9 mg/dL (ref 6–20)
Calcium, Ion: 1.25 mmol/L (ref 1.15–1.40)
Chloride: 98 mmol/L (ref 98–111)
Creatinine, Ser: 0.3 mg/dL — ABNORMAL LOW (ref 0.44–1.00)
Glucose, Bld: 99 mg/dL (ref 70–99)
HCT: 41 % (ref 36.0–46.0)
Hemoglobin: 13.9 g/dL (ref 12.0–15.0)
Potassium: 3.8 mmol/L (ref 3.5–5.1)
Sodium: 139 mmol/L (ref 135–145)
TCO2: 27 mmol/L (ref 22–32)

## 2023-08-09 LAB — CBC WITH DIFFERENTIAL/PLATELET
Abs Immature Granulocytes: 0.03 10*3/uL (ref 0.00–0.07)
Basophils Absolute: 0 10*3/uL (ref 0.0–0.1)
Basophils Relative: 0 %
Eosinophils Absolute: 0.1 10*3/uL (ref 0.0–0.5)
Eosinophils Relative: 2 %
HCT: 41 % (ref 36.0–46.0)
Hemoglobin: 13.9 g/dL (ref 12.0–15.0)
Immature Granulocytes: 0 %
Lymphocytes Relative: 32 %
Lymphs Abs: 2.4 10*3/uL (ref 0.7–4.0)
MCH: 29.3 pg (ref 26.0–34.0)
MCHC: 33.9 g/dL (ref 30.0–36.0)
MCV: 86.3 fL (ref 80.0–100.0)
Monocytes Absolute: 2 10*3/uL — ABNORMAL HIGH (ref 0.1–1.0)
Monocytes Relative: 27 %
Neutro Abs: 2.9 10*3/uL (ref 1.7–7.7)
Neutrophils Relative %: 39 %
Platelets: 144 10*3/uL — ABNORMAL LOW (ref 150–400)
RBC: 4.75 MIL/uL (ref 3.87–5.11)
RDW: 13.8 % (ref 11.5–15.5)
WBC: 7.5 10*3/uL (ref 4.0–10.5)
nRBC: 0 % (ref 0.0–0.2)

## 2023-08-09 LAB — POC URINE PREG, ED: Preg Test, Ur: NEGATIVE

## 2023-08-09 MED ORDER — SODIUM CHLORIDE 0.9 % IV BOLUS
500.0000 mL | Freq: Once | INTRAVENOUS | Status: AC
  Administered 2023-08-09: 500 mL via INTRAVENOUS

## 2023-08-09 MED ORDER — IOHEXOL 300 MG/ML  SOLN
100.0000 mL | Freq: Once | INTRAMUSCULAR | Status: AC | PRN
  Administered 2023-08-09: 100 mL via INTRAVENOUS

## 2023-08-09 MED ORDER — KETOROLAC TROMETHAMINE 30 MG/ML IJ SOLN
30.0000 mg | Freq: Once | INTRAMUSCULAR | Status: AC
  Administered 2023-08-09: 30 mg via INTRAVENOUS
  Filled 2023-08-09: qty 1

## 2023-08-09 MED ORDER — SODIUM CHLORIDE (PF) 0.9 % IJ SOLN
INTRAMUSCULAR | Status: AC
  Filled 2023-08-09: qty 50

## 2023-08-09 MED ORDER — POLYETHYLENE GLYCOL 3350 17 G PO PACK
17.0000 g | PACK | Freq: Every day | ORAL | Status: DC
  Filled 2023-08-09: qty 1

## 2023-08-09 MED ORDER — BISACODYL 5 MG PO TBEC
5.0000 mg | DELAYED_RELEASE_TABLET | Freq: Every day | ORAL | Status: DC | PRN
  Filled 2023-08-09: qty 1

## 2023-08-09 NOTE — ED Triage Notes (Signed)
Pt BIB [facility] from home with reports of constipation x 3 days with LLQ pain. Pt took Colace, senna, and miralax with no relief. Pt is a quad, trach in place.

## 2023-08-09 NOTE — ED Provider Notes (Addendum)
[place] EMERGENCY DEPARTMENT AT [facility] Provider Note   CSN: [id number] Arrival date & time: 08/09/23  0204     History  Chief Complaint  Patient presents with   Constipation    Veronica Huff is a 37 y.o. female.  The history is provided by the patient.  Constipation Severity:  Moderate Time since last bowel movement:  3 days Timing:  Constant Progression:  Unchanged Chronicity:  Recurrent Context: not dietary changes and not medication   Stool description:  None produced Relieved by:  Nothing Worsened by:  Nothing Ineffective treatments: colace BID and one dose of miralax. Associated symptoms: abdominal pain   Associated symptoms: no anorexia, no fever, no nausea and no vomiting   Risk factors: no change in medication   Patient who has quadriplegia presents with recurrent constipation she diets 4-6 waters a day and eats plenty of vegetables but has had a constipation x 3 days.  She does not take miralax daily due to concerns of passing liquid gas.       Home Medications Prior to Admission medications   Medication Sig Start Date End Date Taking? Authorizing Provider  albuterol (PROVENTIL) (2.5 MG/3ML) 0.083% nebulizer solution Take 3 mLs (2.5 mg total) by nebulization every 6 (six) hours as needed for wheezing or shortness of breath. 06/07/22   Coralyn Helling, MD  albuterol (VENTOLIN HFA) 108 (90 Base) MCG/ACT inhaler Inhale into the lungs. 12/12/21   [provider]  baclofen (LIORESAL) 20 MG tablet Take 2 tablets 4 times daily 06/20/23   Levert Feinstein, MD  cephALEXin (KEFLEX) 250 MG capsule Take 250 mg by mouth daily. 04/05/22   [provider]  cetirizine (ZYRTEC) 10 MG tablet Take 10 mg by mouth daily.    [provider]  dantrolene (DANTRIUM) 100 MG capsule TAKE 1 CAPSULE BY MOUTH 3 TIMES DAILY. 03/28/23   Levert Feinstein, MD  diphenhydrAMINE (BENADRYL) 25 MG tablet Take 1 tablet (25 mg total) by mouth every 6 (six) hours as needed for  itching or allergies. Take with Levofloxacin to prevent allergic reaction 05/04/21 06/30/24  Terald Sleeper, MD  Docusate Sodium (DSS) 100 MG CAPS Take 100 mg by mouth 2 (two) times daily with a meal. 12/23/20   [provider]  EPIPEN 2-PAK 0.3 MG/0.3ML SOAJ injection Inject 0.3 mg into the muscle daily as needed for anaphylaxis. 05/10/15   [provider]  fluticasone (FLONASE) 50 MCG/ACT nasal spray INSERT 2 SPRAYS IN EACH NOSTRIL EVERY DAY FOR STUFFY NOSE OR DRAINAGE 08/12/18   Bobbitt, Heywood Iles, MD  gabapentin (NEURONTIN) 300 MG capsule Take 300 mg by mouth 3 (three) times daily.    [provider]  guaiFENesin (MUCINEX) 600 MG 12 hr tablet Take 600 mg by mouth as needed.    [provider]  hydrocortisone cream 0.5 % Apply 1 application topically 2 (two) times daily. To hairline and brows    [provider]  hydrOXYzine (ATARAX) 50 MG tablet Take 50 mg by mouth 2 (two) times daily.    [provider]  ibuprofen (ADVIL) 200 MG tablet Take 400 mg by mouth every 6 (six) hours as needed for fever, headache, mild pain, moderate pain or cramping.    [provider]  imipramine (TOFRANIL) 50 MG tablet TAKE TWO TABLETS BY MOUTH AT BEDTIME 12/25/22   Glean Salvo, NP  ketorolac (ACULAR) 0.5 % ophthalmic solution Place 1 drop into both eyes daily.  04/01/19   [provider]  lactulose (CHRONULAC) 10 GM/15ML solution Take 10 g by mouth daily as needed for mild constipation. 08/06/20   [provider]  medroxyPROGESTERone (DEPO-PROVERA) 150 MG/ML injection Inject 1 mL (150 mg total) into the muscle every 3 (three) months. 03/21/23   Anyanwu, Jethro Bastos, MD  metoprolol succinate (TOPROL-XL) 25 MG 24 hr tablet Take 1 tablet (25 mg total) by mouth in the morning and at bedtime. 12/13/22   Lanier Prude, MD  Multiple Vitamin (MULTIVITAMIN WITH MINERALS) TABS tablet Take 1 tablet by mouth daily.    [provider]   mupirocin ointment (BACTROBAN) 2 % 1 application by Tracheal Tube route daily. Applies to trach-stoma site    [provider]  nystatin (MYCOSTATIN) powder Apply 1 application topically 2 (two) times daily as needed (skin irritation). Small amount . Rash    [provider]  polyethylene glycol powder (MIRALAX) 17 GM/SCOOP powder Start taking 1 capful 3 times a day. Slowly cut back as needed until you have normal bowel movements. 03/13/20   Cardama, Amadeo Garnet, MD  PROMETHEGAN 12.5 MG suppository Place 12.5 mg rectally as needed. 11/04/22   [provider]  pseudoephedrine (SUDAFED) 120 MG 12 hr tablet Take 120 mg by mouth as needed.    [provider]  senna (SENOKOT) 8.6 MG tablet Take 1 tablet by mouth every other day.    [provider]  sertraline (ZOLOFT) 100 MG tablet Take 200 mg by mouth daily. 01/31/22   [provider]  sodium chloride irrigation 0.9 % irrigation Flush with 50 mls up to 6 times a day as directed 01/25/23     solifenacin (VESICARE) 10 MG tablet Take 10 mg by mouth daily.    [provider]  sucralfate (CARAFATE) 1 GM/10ML suspension Take 1 g by mouth 3 (three) times daily.     [provider]  topiramate (TOPAMAX) 50 MG tablet Take 1 tablet (50 mg total) by mouth at bedtime. 11/22/21   Glean Salvo, NP      Allergies    Bee venom, Ciprofloxacin, Tobramycin, Erythromycin, Trazodone, Trazodone and nefazodone, Morphine and codeine, and Vantin [cefpodoxime]    Review of Systems   Review of Systems  Constitutional:  Negative for fever.  HENT:  Negative for facial swelling.   Eyes:  Negative for redness.  Respiratory:  Negative for wheezing and stridor.   Cardiovascular:  Negative for chest pain.  Gastrointestinal:  Positive for abdominal pain and constipation. Negative for anorexia, nausea and vomiting.  All other systems reviewed and are negative.   Physical Exam Updated Vital Signs BP 97/67    Pulse 77   Temp 97.7 F (36.5 C) (Oral)   Resp 14   Ht 5\' 2"  (1.575 m)   Wt 59 kg   SpO2 97%   BMI 23.79 kg/m  Physical Exam Vitals and nursing note reviewed.  Constitutional:      General: She is not in acute distress.    Appearance: Normal appearance. She is well-developed.  HENT:     Head: Normocephalic and atraumatic.     Nose: Nose normal.  Eyes:     Pupils: Pupils are equal, round, and reactive to light.  Cardiovascular:     Rate and Rhythm: Normal rate and regular rhythm.     Pulses: Normal pulses.     Heart sounds: Normal heart sounds.  Pulmonary:     Effort: Pulmonary effort is normal. No respiratory distress.     Breath sounds: Normal breath  sounds.  Abdominal:     General: Bowel sounds are normal. There is no distension.     Palpations: Abdomen is soft.     Tenderness: There is no abdominal tenderness. There is no guarding or rebound.     Hernia: No hernia is present.  Genitourinary:    General: Normal vulva.  Musculoskeletal:        General: Normal range of motion.     Cervical back: Normal range of motion and neck supple.  Skin:    General: Skin is dry.     Capillary Refill: Capillary refill takes less than 2 seconds.     Findings: No erythema or rash.  Neurological:     General: No focal deficit present.     Mental Status: She is alert.     Deep Tendon Reflexes: Reflexes normal.  Psychiatric:        Mood and Affect: Mood normal.     ED Results / Procedures / Treatments   Labs (all labs ordered are listed, but only abnormal results are displayed) Results for orders placed or performed during the hospital encounter of 08/09/23  CBC with Differential  Result Value Ref Range   WBC 7.5 4.0 - 10.5 K/uL   RBC 4.75 3.87 - 5.11 MIL/uL   Hemoglobin 13.9 12.0 - 15.0 g/dL   HCT 10.9 32.3 - 55.7 %   MCV 86.3 80.0 - 100.0 fL   MCH 29.3 26.0 - 34.0 pg   MCHC 33.9 30.0 - 36.0 g/dL   RDW 32.2 02.5 - 42.7 %   Platelets 144 (L) 150 - 400 K/uL   nRBC 0.0 0.0  - 0.2 %   Neutrophils Relative % 39 %   Neutro Abs 2.9 1.7 - 7.7 K/uL   Lymphocytes Relative 32 %   Lymphs Abs 2.4 0.7 - 4.0 K/uL   Monocytes Relative 27 %   Monocytes Absolute 2.0 (H) 0.1 - 1.0 K/uL   Eosinophils Relative 2 %   Eosinophils Absolute 0.1 0.0 - 0.5 K/uL   Basophils Relative 0 %   Basophils Absolute 0.0 0.0 - 0.1 K/uL   Immature Granulocytes 0 %   Abs Immature Granulocytes 0.03 0.00 - 0.07 K/uL  I-stat chem 8, ED (not at [facility], [facility] or [facility])  Result Value Ref Range   Sodium 139 135 - 145 mmol/L   Potassium 3.8 3.5 - 5.1 mmol/L   Chloride 98 98 - 111 mmol/L   BUN 9 6 - 20 mg/dL   Creatinine, Ser 0.62 (L) 0.44 - 1.00 mg/dL   Glucose, Bld 99 70 - 99 mg/dL   Calcium, Ion 3.76 2.83 - 1.40 mmol/L   TCO2 27 22 - 32 mmol/L   Hemoglobin 13.9 12.0 - 15.0 g/dL   HCT 15.1 76.1 - 60.7 %  POC Urine Pregnancy, ED (not at [facility] or [facility])  Result Value Ref Range   Preg Test, Ur NEGATIVE NEGATIVE   No results found.   Radiology No results found.  Procedures Procedures    Medications Ordered in ED Medications  ketorolac (TORADOL) 30 MG/ML injection 30 mg (30 mg Intravenous Given 08/09/23 0426)  sodium chloride 0.9 % bolus 500 mL (0 mLs Intravenous Stopped 08/09/23 0555)  iohexol (OMNIPAQUE) 300 MG/ML solution 100 mL (100 mLs Intravenous Contrast Given 08/09/23 0606)    ED Course/ Medical Decision Making/ A&P  Medical Decision Making Patient with 3 days of constipation with quadriplegia   Amount and/or Complexity of Data Reviewed Independent Historian: EMS    Details: See above  External Data Reviewed: notes.    Details: Previous notes reviewed  Labs: ordered.    Details: Pregnancy negative.  Normal sodium 139, normal potasium 3.8, creatinine .3.  Normal wgite count 7.5, normal hemoglobin 13.9, platelets slight low 144  Radiology: ordered and independent interpretation performed.    Details: No obstruction on CT, moderate constipation    Risk OTC drugs. Prescription drug management. Risk Details: I have discussed CT findings with patient.  Patient is amenable to a dose of miralax and I have advised increased liquids vs.  Liquid diet until patient feels relieved.  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 Patient states she is aware of this and thinks it may be intentional but I explained needs to follow up with urology.  Stable for discharge.      Final Clinical Impression(s) / ED Diagnoses Return for intractable cough, coughing up blood, fevers > 100.4 unrelieved by medication, shortness of breath, intractable vomiting, chest pain, shortness of breath, weakness, numbness, changes in speech, facial asymmetry, abdominal pain, passing out, Inability to tolerate liquids or food, cough, altered mental status or any concerns. No signs of systemic illness or infection. The patient is nontoxic-appearing on exam and vital signs are within normal limits.  I have reviewed the triage vital signs and the nursing notes. Pertinent labs & imaging results that were available during my care of the patient were reviewed by me and considered in my medical decision making (see chart for details). After history, exam, and medical workup I feel the patient has been appropriately medically screened and is safe for discharge home. Pertinent diagnoses were discussed with the patient. Patient was given return precautions.        Milli Woolridge, MD 08/09/23 [phone]

## 2023-08-09 NOTE — Discharge Instructions (Addendum)
Miralax one packet a day and may try a liquid diet for 2-3 days

## 2023-08-09 NOTE — ED Notes (Signed)
[facility] called at this time. Stated approximate ETA of a few minutes. Pt made aware.

## 2023-08-10 ENCOUNTER — Other Ambulatory Visit: Payer: Self-pay | Admitting: Family Medicine

## 2023-08-10 MED ORDER — CIPROFLOXACIN HCL 500 MG PO TABS: ORAL_TABLET | ORAL | 0 refills | Status: DC

## 2023-08-10 NOTE — Progress Notes (Signed)
Ciprofloxacin tablets ordered for drug challenge on 09/10/2023

## 2023-08-10 NOTE — Telephone Encounter (Signed)
Can you please let this patient know that I have ordered 2 tablets for the challenge. They will be filled on 08/21/2023. Please bring to the appointment for drug challenge. Remember to stop antihistamines for 3 days before the drug challenge appointment. Thank you

## 2023-08-11 ENCOUNTER — Encounter (HOSPITAL_COMMUNITY): Payer: Self-pay

## 2023-08-11 ENCOUNTER — Other Ambulatory Visit: Payer: Self-pay

## 2023-08-13 ENCOUNTER — Ambulatory Visit: Payer: Medicare Other | Admitting: Family Medicine

## 2023-08-13 NOTE — Telephone Encounter (Signed)
I called patient and left a message to call the office back to inform.

## 2023-08-14 NOTE — Telephone Encounter (Signed)
I called patient and informed to pick up rx 2-3 days before appt. I informed to stay off antihistamines 3 days before the appointment and no injections/antibiotics a week prior. She informed me that she does take keflex daily and asked if she needs to stop that or if she's okay to continue taking. Please advise.

## 2023-08-15 NOTE — Telephone Encounter (Signed)
Please have her continue keflex at her regular dose. It will not interfere with her cipro challenge. Thank you

## 2023-08-15 NOTE — Telephone Encounter (Signed)
Lm for pt to call us back about this °

## 2023-08-15 NOTE — Telephone Encounter (Signed)
Pt informed of this and stated understanding  ?

## 2023-09-10 ENCOUNTER — Encounter: Payer: Medicare Other | Admitting: Family Medicine

## 2023-09-10 NOTE — Progress Notes (Deleted)
   [address] [place] [address] Dept: [phone]  FOLLOW UP NOTE  Patient ID: Veronica Huff, female    DOB: [birth year]  Age: 37 y.o. MRN: [id number] Date of Office Visit: 09/10/2023  Assessment  Chief Complaint: No chief complaint on file.  HPI Veronica Huff is a 37 year old female who presents to the clinic for follow-up visit with drug challenge to ciprofloxacin.  She was last seen in this clinic on 07/09/2023 by Thermon Leyland, FNP for evaluation of drug allergy, recurrent infection, and otitis externa.  Discussed the use of AI scribe software for clinical note transcription with the patient, who gave verbal consent to proceed.  History of Present Illness             Drug Allergies:  Allergies  Allergen Reactions   Bee Venom Shortness Of Breath, Swelling and Anaphylaxis    SHOCK   Ciprofloxacin Hives and Rash   Tobramycin Hives and Rash   Erythromycin Other (See Comments)    UNSPECIFIED REACTION     Trazodone Other (See Comments)    UNSPECIFIED REACTION  Eyes shake   Trazodone And Nefazodone     UNSPECIFIED REACTION    Morphine And Codeine Other (See Comments)    "GOES CRAZY"    Vantin [Cefpodoxime] Palpitations    Physical Exam: There were no vitals taken for this visit.   Physical Exam  Diagnostics:   Procedure note:  Written consent obtained  {Blank single:19197::"Open graded *** challenge","Open graded *** oral challenge"}: The patient was able to tolerate the challenge today without adverse signs or symptoms. Vital signs were stable throughout the challenge and observation period. She received multiple doses separated by {Blank single:19197::"30 minutes","20 minutes","15 minutes","10 minutes"}, each of which was separated by vitals and a brief physical exam. She received the following doses: lip rub, 1 gm, 2 gm, 4 gm, 8 gm, and 16 gm. She was monitored for 60 minutes following the last dose.  Total testing time:  The patient had {Blank  single:19197::"***","negative skin prick test and sIgE tests to ***","negative sIgE tests to ***","negative skin prick tests to ***"} and was able to tolerate the open graded oral challenge today without adverse signs or symptoms. Therefore, she has the same risk of systemic reaction associated with {Blank single:19197::"***","the consumption of ***"} as the general population.  Assessment and Plan: No diagnosis found.  No orders of the defined types were placed in this encounter.   There are no Patient Instructions on file for this visit.  No follow-ups on file.    Thank you for the opportunity to care for this patient.  Please do not hesitate to contact me with questions.  Thermon Leyland, FNP [facility]

## 2023-09-21 ENCOUNTER — Encounter: Payer: Self-pay | Admitting: Neurology

## 2023-09-24 MED ORDER — BACLOFEN 20 MG PO TABS: ORAL_TABLET | ORAL | 2 refills | Status: DC

## 2023-10-04 NOTE — Addendum Note (Signed)
Addended by: Deatra James on: 10/04/2023 04:04 PM   Modules accepted: Orders

## 2023-10-08 ENCOUNTER — Telehealth (HOSPITAL_BASED_OUTPATIENT_CLINIC_OR_DEPARTMENT_OTHER): Payer: Self-pay | Admitting: Pulmonary Disease

## 2023-10-08 ENCOUNTER — Encounter: Payer: Medicare Other | Admitting: Family Medicine

## 2023-10-08 ENCOUNTER — Institutional Professional Consult (permissible substitution): Payer: Medicare Other | Admitting: Cardiovascular Disease

## 2023-10-08 ENCOUNTER — Encounter: Payer: Self-pay | Admitting: Family Medicine

## 2023-10-08 DIAGNOSIS — J9611 Chronic respiratory failure with hypoxia: Secondary | ICD-10-CM

## 2023-10-08 DIAGNOSIS — G4733 Obstructive sleep apnea (adult) (pediatric): Secondary | ICD-10-CM

## 2023-10-08 MED ORDER — BACLOFEN 20 MG PO TABS: 40.0000 mg | ORAL_TABLET | Freq: Four times a day (QID) | ORAL | 11 refills | Status: DC

## 2023-10-08 NOTE — Progress Notes (Deleted)
   [address] [place] [address] Dept: [phone]  FOLLOW UP NOTE  Patient ID: Veronica Huff, female    DOB: May 23, 1986  Age: 37 y.o. MRN: [id number] Date of Office Visit: 10/08/2023  Assessment  Chief Complaint: No chief complaint on file.  HPI Veronica Huff is a 37 year old female who presents to the clinic for follow-up visit.  She was last seen in this clinic on 06/29/2023 by Thermon Leyland, FNP, for evaluation of otitis externa, recurrent infection, and drug allergies.  Discussed the use of AI scribe software for clinical note transcription with the patient, who gave verbal consent to proceed.  History of Present Illness             Drug Allergies:  Allergies  Allergen Reactions  . Bee Venom Shortness Of Breath, Swelling and Anaphylaxis    SHOCK  . Ciprofloxacin Hives and Rash  . Tobramycin Hives and Rash  . Erythromycin Other (See Comments)    UNSPECIFIED REACTION    . Trazodone Other (See Comments)    UNSPECIFIED REACTION  Eyes shake  . Trazodone And Nefazodone     UNSPECIFIED REACTION   . Morphine And Codeine Other (See Comments)    "GOES CRAZY"   . Vantin [Cefpodoxime] Palpitations    Physical Exam: There were no vitals taken for this visit.   Physical Exam  Diagnostics:  Procedure note:  Written consent obtained  {Blank single:19197::"Open graded *** challenge","Open graded *** oral challenge"}: The patient was able to tolerate the challenge today without adverse signs or symptoms. Vital signs were stable throughout the challenge and observation period. She received multiple doses separated by {Blank single:19197::"30 minutes","20 minutes","15 minutes","10 minutes"}, each of which was separated by vitals and a brief physical exam. She received the following doses: lip rub, 1 gm, 2 gm, 4 gm, 8 gm, and 16 gm. She was monitored for 60 minutes following the last dose.  Total testing time:  The patient had {Blank single:19197::"***","negative skin  prick test and sIgE tests to ***","negative sIgE tests to ***","negative skin prick tests to ***"} and was able to tolerate the open graded oral challenge today without adverse signs or symptoms. Therefore, she has the same risk of systemic reaction associated with {Blank single:19197::"***","the consumption of ***"} as the general population.  Assessment and Plan: No diagnosis found.  No orders of the defined types were placed in this encounter.   There are no Patient Instructions on file for this visit.  No follow-ups on file.    Thank you for the opportunity to care for this patient.  Please do not hesitate to contact me with questions.  Thermon Leyland, FNP [facility]

## 2023-10-08 NOTE — Telephone Encounter (Signed)
I called patient to clarify the baclofen dosage, she is on baclofen 20mg  2 tabs qid,   Meds ordered this encounter  Medications   DISCONTD: baclofen (LIORESAL) 20 MG tablet    Sig: Take 1 tablet 4 times daily    Dispense:  240 tablet    Refill:  2   baclofen (LIORESAL) 20 MG tablet    Sig: Take 2 tablets (40 mg total) by mouth 4 (four) times daily. Take 1 tablet 4 times daily    Dispense:  245 tablet    Refill:  11

## 2023-10-08 NOTE — Addendum Note (Signed)
Addended by: Levert Feinstein on: 10/08/2023 02:25 PM   Modules accepted: Orders

## 2023-10-09 NOTE — Telephone Encounter (Signed)
Referral has been sent, pt called no answer, lvmm as listed in [facility]

## 2023-10-12 ENCOUNTER — Encounter: Payer: Self-pay | Admitting: Cardiology

## 2023-10-12 ENCOUNTER — Other Ambulatory Visit (HOSPITAL_COMMUNITY): Payer: Self-pay

## 2023-10-12 MED ORDER — METOPROLOL SUCCINATE ER 25 MG PO TB24
ORAL_TABLET | ORAL | 0 refills | Status: DC
  Filled 2023-10-12: qty 60, 30d supply, fill #0

## 2023-10-15 MED ORDER — METOPROLOL SUCCINATE ER 25 MG PO TB24: ORAL_TABLET | ORAL | 0 refills | Status: DC

## 2023-10-15 NOTE — Addendum Note (Signed)
Addended by: Frutoso Schatz on: 10/15/2023 05:21 PM   Modules accepted: Orders

## 2023-10-16 ENCOUNTER — Other Ambulatory Visit (HOSPITAL_COMMUNITY): Payer: Self-pay

## 2023-11-05 ENCOUNTER — Ambulatory Visit: Payer: Medicare Other | Admitting: Cardiovascular Disease

## 2023-11-06 ENCOUNTER — Other Ambulatory Visit (HOSPITAL_COMMUNITY): Payer: Self-pay

## 2023-11-07 ENCOUNTER — Other Ambulatory Visit: Payer: Self-pay

## 2023-11-07 ENCOUNTER — Other Ambulatory Visit (HOSPITAL_COMMUNITY): Payer: Self-pay

## 2023-11-08 ENCOUNTER — Other Ambulatory Visit: Payer: Self-pay

## 2023-11-08 ENCOUNTER — Other Ambulatory Visit (HOSPITAL_COMMUNITY): Payer: Self-pay

## 2023-11-13 ENCOUNTER — Other Ambulatory Visit (HOSPITAL_COMMUNITY): Payer: Self-pay

## 2023-11-15 ENCOUNTER — Other Ambulatory Visit (HOSPITAL_COMMUNITY): Payer: Self-pay

## 2023-11-19 ENCOUNTER — Other Ambulatory Visit (HOSPITAL_COMMUNITY): Payer: Self-pay

## 2023-11-23 ENCOUNTER — Other Ambulatory Visit (HOSPITAL_COMMUNITY): Payer: Self-pay

## 2023-11-23 ENCOUNTER — Other Ambulatory Visit: Payer: Self-pay

## 2023-11-23 ENCOUNTER — Telehealth: Payer: Self-pay | Admitting: Cardiovascular Disease

## 2023-11-23 MED ORDER — ALBUTEROL SULFATE (2.5 MG/3ML) 0.083% IN NEBU
2.5000 mg | INHALATION_SOLUTION | Freq: Four times a day (QID) | RESPIRATORY_TRACT | 3 refills | Status: DC | PRN
Start: 2023-11-15 — End: 2024-06-03
  Filled 2023-11-23 – 2023-11-29 (×3): qty 180, 15d supply, fill #0
  Filled 2024-02-01: qty 180, 15d supply, fill #1

## 2023-11-23 MED ORDER — ALBUTEROL SULFATE (2.5 MG/3ML) 0.083% IN NEBU
3.0000 mL | INHALATION_SOLUTION | Freq: Four times a day (QID) | RESPIRATORY_TRACT | 3 refills | Status: AC | PRN
  Filled 2024-03-10: qty 180, 15d supply, fill #0
  Filled 2024-04-29: qty 180, 15d supply, fill #1

## 2023-11-23 NOTE — Telephone Encounter (Signed)
 Wants to r/s Consult with Dr Allyson Sabal

## 2023-11-26 ENCOUNTER — Other Ambulatory Visit: Payer: Self-pay

## 2023-11-29 ENCOUNTER — Other Ambulatory Visit: Payer: Self-pay

## 2023-11-29 ENCOUNTER — Other Ambulatory Visit (HOSPITAL_COMMUNITY): Payer: Self-pay

## 2023-12-03 ENCOUNTER — Encounter: Payer: Medicare Other | Admitting: Family Medicine

## 2023-12-03 ENCOUNTER — Other Ambulatory Visit (HOSPITAL_COMMUNITY): Payer: Self-pay

## 2023-12-03 ENCOUNTER — Other Ambulatory Visit: Payer: Self-pay

## 2023-12-03 NOTE — Progress Notes (Deleted)
   [address] [place] [address] Dept: [phone]  FOLLOW UP NOTE  Patient ID: Veronica Huff, female    DOB: 19-Aug-1986  Age: 38 y.o. MRN: [id number] Date of Office Visit: 12/03/2023  Assessment  Chief Complaint: No chief complaint on file.  HPI Veronica Huff   Discussed the use of AI scribe software for clinical note transcription with the patient, who gave verbal consent to proceed.  History of Present Illness             Drug Allergies:  Allergies  Allergen Reactions  . Bee Venom Shortness Of Breath, Swelling and Anaphylaxis    SHOCK  . Ciprofloxacin  Hives and Rash  . Tobramycin Hives and Rash  . Erythromycin Other (See Comments)    UNSPECIFIED REACTION    . Trazodone Other (See Comments)    UNSPECIFIED REACTION  Eyes shake  . Trazodone And Nefazodone     UNSPECIFIED REACTION   . Morphine And Codeine Other (See Comments)    GOES CRAZY   . Vantin [Cefpodoxime] Palpitations    Physical Exam: There were no vitals taken for this visit.   Physical Exam  Diagnostics:    Assessment and Plan: No diagnosis found.  No orders of the defined types were placed in this encounter.   There are no Patient Instructions on file for this visit.  No follow-ups on file.    Thank you for the opportunity to care for this patient.  Please do not hesitate to contact me with questions.  Arlean Mutter, FNP [facility]

## 2023-12-04 ENCOUNTER — Other Ambulatory Visit: Payer: Self-pay

## 2023-12-04 ENCOUNTER — Other Ambulatory Visit (HOSPITAL_COMMUNITY): Payer: Self-pay

## 2023-12-11 ENCOUNTER — Encounter: Payer: Self-pay | Admitting: Neurology

## 2023-12-28 ENCOUNTER — Other Ambulatory Visit: Payer: Self-pay

## 2023-12-31 ENCOUNTER — Other Ambulatory Visit (HOSPITAL_COMMUNITY): Payer: Self-pay

## 2023-12-31 ENCOUNTER — Other Ambulatory Visit: Payer: Self-pay

## 2024-01-01 ENCOUNTER — Other Ambulatory Visit: Payer: Self-pay

## 2024-01-02 ENCOUNTER — Other Ambulatory Visit: Payer: Self-pay

## 2024-01-13 NOTE — Progress Notes (Unsigned)
 Follow Up Note  RE: Veronica Huff MRN: [id number] DOB: 07-Oct-1986 Date of Office Visit: 01/14/2024  Referring provider: Nona Dell, NP Primary care provider: Nona Dell, NP  Chief Complaint: No chief complaint on file.  Assessment and Plan: Veronica Huff is a 38 y.o. 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 If you see these symptoms, use Benadryl for mild symptoms and epinephrine for more severe symptoms and call 911.   History of Present Illness: I had the pleasure of seeing Veronica Huff for a follow up visit at the [facility] on 01/13/2024. She is a 38 y.o. female, who is being followed for multiple drug allergies. Her previous allergy office visit was on 07/09/23 with Thermon Leyland, FNP. Today she is here for ciprofloxacin drug testing and challenge.   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 Reaction in the form of rash/hives to penicillin, cipro and tobramycin. Patient is insure of specific reactions/timeline but does not recall any significant reactions. Tolerates bactrim and is on prophylactic keflex with no issues.   Interval History: Patient has not been ill, she has not had any accidental exposures to the culprit medication.   Recent/Current History: Pulmonary disease: {Blank single:19197::"yes","no"} Cardiac disease: {Blank single:19197::"yes","no"} Respiratory infection: {Blank single:19197::"yes","no"} Rash: {Blank  single:19197::"yes","no"} Itch: {Blank single:19197::"yes","no"} Swelling: {Blank single:19197::"yes","no"} Cough: {Blank single:19197::"yes","no"} Shortness of breath: {Blank single:19197::"yes","no"} Runny/stuffy nose: {Blank single:19197::"yes","no"} Itchy eyes: {Blank single:19197::"yes","no"} Beta-blocker use: {Blank single:19197::"yes","no"}  Patient/guardian was informed of the test procedure with verbalized understanding of the risk of anaphylaxis. Consent was signed.   Last antihistamine use ***  Medication List:  Current Outpatient Medications  Medication Sig Dispense Refill   albuterol (PROVENTIL) (2.5 MG/3ML) 0.083% nebulizer solution Take 3 mLs (2.5 mg total) by nebulization every 6 (six) hours as needed for wheezing or shortness of breath. 360 mL 5   albuterol (PROVENTIL) (2.5 MG/3ML) 0.083% nebulizer solution Take 3 mLs (2.5 mg total) by nebulization every 6 (six) hours as needed for wheezing or shortness of breath. 180 mL 3   albuterol (PROVENTIL) (2.5 MG/3ML) 0.083% nebulizer solution Take 3 mL (2.5 mg total) by nebulization every 6 (six) hours as needed for wheezing or shortness of breath. 180 mL 3   albuterol (VENTOLIN HFA) 108 (90 Base) MCG/ACT inhaler Inhale into the lungs.     baclofen (LIORESAL) 20 MG tablet Take 2 tablets (40 mg total) by mouth 4 (four) times daily. Take 1 tablet 4 times daily 245 tablet 11   cephALEXin (KEFLEX) 250 MG capsule Take 250 mg by mouth daily.     cetirizine (ZYRTEC) 10 MG tablet Take 10 mg by mouth daily.     ciprofloxacin (CIPRO) 500 MG tablet Bring to your Allergy and Asthma appointment on 09/10/2023 2 tablet 0   dantrolene (DANTRIUM) 100 MG capsule TAKE 1 CAPSULE BY MOUTH 3 TIMES DAILY. 270 capsule 3   diphenhydrAMINE (BENADRYL) 25 MG tablet Take 1 tablet (25 mg total) by mouth every 6 (six) hours as needed for itching or allergies. Take with Levofloxacin to prevent allergic reaction 30 tablet  0   Docusate Sodium (DSS) 100 MG CAPS Take  100 mg by mouth 2 (two) times daily with a meal.     EPIPEN 2-PAK 0.3 MG/0.3ML SOAJ injection Inject 0.3 mg into the muscle daily as needed for anaphylaxis.  1   fluticasone (FLONASE) 50 MCG/ACT nasal spray INSERT 2 SPRAYS IN EACH NOSTRIL EVERY DAY FOR STUFFY NOSE OR DRAINAGE 16 g 5   gabapentin (NEURONTIN) 300 MG capsule Take 300 mg by mouth 3 (three) times daily.     guaiFENesin (MUCINEX) 600 MG 12 hr tablet Take 600 mg by mouth as needed.     hydrocortisone cream 0.5 % Apply 1 application topically 2 (two) times daily. To hairline and brows     hydrOXYzine (ATARAX) 50 MG tablet Take 50 mg by mouth 2 (two) times daily.     ibuprofen (ADVIL) 200 MG tablet Take 400 mg by mouth every 6 (six) hours as needed for fever, headache, mild pain, moderate pain or cramping.     imipramine (TOFRANIL) 50 MG tablet TAKE TWO TABLETS BY MOUTH AT BEDTIME 60 tablet 10   ketorolac (ACULAR) 0.5 % ophthalmic solution Place 1 drop into both eyes daily.      lactulose (CHRONULAC) 10 GM/15ML solution Take 10 g by mouth daily as needed for mild constipation.     medroxyPROGESTERone (DEPO-PROVERA) 150 MG/ML injection Inject 1 mL (150 mg total) into the muscle every 3 (three) months. 1 mL 3   metoprolol succinate (TOPROL-XL) 25 MG 24 hr tablet Take 1 tablet (25 mg total) by mouth in the morning and at bedtime. Please schedule an appointment for future refills. 60 tablet 0   Multiple Vitamin (MULTIVITAMIN WITH MINERALS) TABS tablet Take 1 tablet by mouth daily.     mupirocin ointment (BACTROBAN) 2 % 1 application by Tracheal Tube route daily. Applies to trach-stoma site     nystatin (MYCOSTATIN) powder Apply 1 application topically 2 (two) times daily as needed (skin irritation). Small amount . Rash     polyethylene glycol powder (MIRALAX) 17 GM/SCOOP powder Start taking 1 capful 3 times a day. Slowly cut back as needed until you have normal bowel movements. 255 g 0   PROMETHEGAN 12.5 MG suppository Place 12.5 mg rectally as  needed.     pseudoephedrine (SUDAFED) 120 MG 12 hr tablet Take 120 mg by mouth as needed.     senna (SENOKOT) 8.6 MG tablet Take 1 tablet by mouth every other day.     sertraline (ZOLOFT) 100 MG tablet Take 200 mg by mouth daily.     sodium chloride irrigation 0.9 % irrigation Flush with 50 mls up to 6 times a day as directed 5000 mL 3   solifenacin (VESICARE) 10 MG tablet Take 10 mg by mouth daily.     sucralfate (CARAFATE) 1 GM/10ML suspension Take 1 g by mouth 3 (three) times daily.      topiramate (TOPAMAX) 50 MG tablet Take 1 tablet (50 mg total) by mouth at bedtime. 90 tablet 3   Current Facility-Administered Medications  Medication Dose Route Frequency Provider Last Rate Last Admin   incobotulinumtoxinA (XEOMIN) 100 units injection 400 Units  400 Units Intramuscular Q90 days Levert Feinstein, MD   400 Units at 10/28/22 2122   Allergies: Allergies  Allergen Reactions   Bee Venom Shortness Of Breath, Swelling and Anaphylaxis    SHOCK   Ciprofloxacin Hives and Rash   Tobramycin Hives and Rash   Erythromycin Other (See Comments)    UNSPECIFIED  REACTION     Trazodone Other (See Comments)    UNSPECIFIED REACTION  Eyes shake   Trazodone And Nefazodone     UNSPECIFIED REACTION    Morphine And Codeine Other (See Comments)    "GOES CRAZY"    Vantin [Cefpodoxime] Palpitations   I reviewed her past medical history, social history, family history, and environmental history and no significant changes have been reported from her previous visit.  Review of Systems Objective: There were no vitals taken for this visit. There is no height or weight on file to calculate BMI. Physical Exam  Diagnostics: Spirometry:  Tracings reviewed. Her effort: {Blank single:19197::"Good reproducible efforts.","It was hard to get consistent efforts and there is a question as to whether this reflects a maximal maneuver.","Poor effort, data can not be interpreted."} FVC: ***L FEV1: ***L, ***%  predicted FEV1/FVC ratio: ***% Interpretation: {Blank single:19197::"Spirometry consistent with mild obstructive disease","Spirometry consistent with moderate obstructive disease","Spirometry consistent with severe obstructive disease","Spirometry consistent with possible restrictive disease","Spirometry consistent with mixed obstructive and restrictive disease","Spirometry uninterpretable due to technique","Spirometry consistent with normal pattern","No overt abnormalities noted given today's efforts"}.  Please see scanned spirometry results for details.  Skin Testing: {Blank single:19197::"None","Deferred due to recent antihistamines use"}. Positive test to: ***. Negative test to: ***.  Results discussed with patient/family.    Previous notes and tests were reviewed. The plan was reviewed with the patient/family, and all questions/concerned were addressed.  It was my pleasure to see Laurinda today and participate in her care. Please feel free to contact me with any questions or concerns.  Tonny Bollman, MD [facility]

## 2024-01-14 ENCOUNTER — Ambulatory Visit (INDEPENDENT_AMBULATORY_CARE_PROVIDER_SITE_OTHER): Payer: Medicare Other | Admitting: Internal Medicine

## 2024-01-14 ENCOUNTER — Encounter: Payer: Self-pay | Admitting: Internal Medicine

## 2024-01-14 ENCOUNTER — Other Ambulatory Visit: Payer: Self-pay

## 2024-01-14 VITALS — BP 106/66 | HR 76 | Temp 99.2°F | Resp 16 | Ht 62.0 in | Wt 140.0 lb

## 2024-01-14 DIAGNOSIS — L508 Other urticaria: Secondary | ICD-10-CM

## 2024-01-14 DIAGNOSIS — Z888 Allergy status to other drugs, medicaments and biological substances status: Secondary | ICD-10-CM | POA: Diagnosis not present

## 2024-01-14 DIAGNOSIS — Z889 Allergy status to unspecified drugs, medicaments and biological substances status: Secondary | ICD-10-CM

## 2024-01-14 NOTE — Patient Instructions (Signed)
 You have passed a ciprofloxacin challenge in clinic.  You were able to tolerate 500 mg total of the medication without any symptoms.  Your risk of an allergic reaction is now considered similar to that of the general public.  This has been cleared from your allergy list.  It is not uncommon for medications to continue to pop up on allergy list.  Is important that you continue to let your providers know that you have been evaluated by an allergist and are no longer considered allergic to ciprofloxacin or penicillins.  Follow-up as needed

## 2024-01-18 ENCOUNTER — Other Ambulatory Visit (HOSPITAL_COMMUNITY): Payer: Self-pay

## 2024-01-21 ENCOUNTER — Other Ambulatory Visit (HOSPITAL_COMMUNITY): Payer: Self-pay

## 2024-01-21 MED ORDER — SODIUM CHLORIDE 0.9 % IR SOLN
50.0000 mL | Freq: Every day | 3 refills | Status: DC
  Filled 2024-01-21: qty 5000, 17d supply, fill #0
  Filled 2024-02-21: qty 5000, 17d supply, fill #1
  Filled 2024-03-10: qty 5000, 17d supply, fill #2
  Filled 2024-04-08: qty 5000, 17d supply, fill #3

## 2024-01-22 ENCOUNTER — Encounter: Payer: Self-pay | Admitting: Cardiology

## 2024-01-22 ENCOUNTER — Other Ambulatory Visit: Payer: Self-pay

## 2024-01-23 ENCOUNTER — Other Ambulatory Visit: Payer: Self-pay

## 2024-02-01 ENCOUNTER — Other Ambulatory Visit: Payer: Self-pay

## 2024-02-01 ENCOUNTER — Other Ambulatory Visit (HOSPITAL_COMMUNITY): Payer: Self-pay

## 2024-02-01 ENCOUNTER — Emergency Department (HOSPITAL_COMMUNITY)

## 2024-02-01 ENCOUNTER — Emergency Department (HOSPITAL_COMMUNITY)
Admission: EM | Admit: 2024-02-01 | Discharge: 2024-02-02 | Disposition: A | Discharge status: Home / Self Care | Attending: Student | Admitting: Student

## 2024-02-01 ENCOUNTER — Encounter (HOSPITAL_COMMUNITY): Payer: Self-pay

## 2024-02-01 DIAGNOSIS — R600 Localized edema: Secondary | ICD-10-CM | POA: Diagnosis not present

## 2024-02-01 DIAGNOSIS — J101 Influenza due to other identified influenza virus with other respiratory manifestations: Secondary | ICD-10-CM | POA: Insufficient documentation

## 2024-02-01 DIAGNOSIS — R079 Chest pain, unspecified: Secondary | ICD-10-CM | POA: Diagnosis present

## 2024-02-01 LAB — CBC
HCT: 43.4 % (ref 36.0–46.0)
Hemoglobin: 13.8 g/dL (ref 12.0–15.0)
MCH: 27.4 pg (ref 26.0–34.0)
MCHC: 31.8 g/dL (ref 30.0–36.0)
MCV: 86.3 fL (ref 80.0–100.0)
Platelets: 199 10*3/uL (ref 150–400)
RBC: 5.03 MIL/uL (ref 3.87–5.11)
RDW: 15.9 % — ABNORMAL HIGH (ref 11.5–15.5)
WBC: 8.4 10*3/uL (ref 4.0–10.5)
nRBC: 0 % (ref 0.0–0.2)

## 2024-02-01 NOTE — ED Triage Notes (Signed)
 Pt BIB [facility] from home for PNA. Pt caregiver had as URI a few weeks ago and now pt complains of trach pain and a "wet nose". Pt had portable CXR yesterday and PCP sent here for followup PNA.

## 2024-02-01 NOTE — ED Provider Notes (Signed)
 [place] EMERGENCY DEPARTMENT AT [facility] Provider Note   CSN: [id number] Arrival date & time: 02/01/24  2133     History  Chief Complaint  Patient presents with   Pneumonia    Veronica Huff is a 38 y.o. female with hx of quadriplegia after CVA at 38 y/o, tracheostomy, who presents for further workup with reported PNA on outpatient CXR.  Patient reports that she was exposed to upper respiratory infection by one of her home nurses and was started on antibiotics for sinus infection.  Initially was placed on Levaquin per patient which she did not tolerate due to itching in the throat.  Subsequently was given a course of doxycycline but was still "feeling poorly" and is currently completing a course of Augmentin.  Patient states that her primary complaint at this time is soreness around her trach stoma.  Without fever or chills.  Tmax at home over the last week was 100.2.  Accompanied by her boyfriend at the bedside.  Presented to the ED at the direction of her outpatient provider for further evaluation of possible pneumonia.  Endorsing increased usage of albuterol nebs at home, due to chest tightness.  States oxygen saturation is typically greater than 94% on room air and has been hovering around 88% on room air requiring one half of a liter of supplemental oxygen by trach mask.    HPI     Home Medications Prior to Admission medications   Medication Sig Start Date End Date Taking? Authorizing Provider  albuterol (PROVENTIL) (2.5 MG/3ML) 0.083% nebulizer solution Take 3 mLs (2.5 mg total) by nebulization every 6 (six) hours as needed for wheezing or shortness of breath. 06/07/22   Coralyn Helling, MD  albuterol (PROVENTIL) (2.5 MG/3ML) 0.083% nebulizer solution Take 3 mLs (2.5 mg total) by nebulization every 6 (six) hours as needed for wheezing or shortness of breath. 11/15/23     albuterol (PROVENTIL) (2.5 MG/3ML) 0.083% nebulizer solution Take 3 mL (2.5 mg total) by  nebulization every 6 (six) hours as needed for wheezing or shortness of breath. 11/23/23     albuterol (VENTOLIN HFA) 108 (90 Base) MCG/ACT inhaler Inhale into the lungs. 12/12/21   [provider]  baclofen (LIORESAL) 20 MG tablet Take 2 tablets (40 mg total) by mouth 4 (four) times daily. Take 1 tablet 4 times daily 10/08/23   Levert Feinstein, MD  cephALEXin (KEFLEX) 250 MG capsule Take 250 mg by mouth daily. 04/05/22   [provider]  cetirizine (ZYRTEC) 10 MG tablet Take 10 mg by mouth daily.    [provider]  dantrolene (DANTRIUM) 100 MG capsule TAKE 1 CAPSULE BY MOUTH 3 TIMES DAILY. 03/28/23   Levert Feinstein, MD  diphenhydrAMINE (BENADRYL) 25 MG tablet Take 1 tablet (25 mg total) by mouth every 6 (six) hours as needed for itching or allergies. Take with Levofloxacin to prevent allergic reaction 05/04/21 06/30/24  Terald Sleeper, MD  Docusate Sodium (DSS) 100 MG CAPS Take 100 mg by mouth 2 (two) times daily with a meal. 12/23/20   [provider]  EPIPEN 2-PAK 0.3 MG/0.3ML SOAJ injection Inject 0.3 mg into the muscle daily as needed for anaphylaxis. 05/10/15   [provider]  fluconazole (DIFLUCAN) 150 MG tablet Take 150 mg by mouth once. 08/21/23   [provider]  fluticasone (FLONASE) 50 MCG/ACT nasal spray INSERT 2 SPRAYS IN EACH NOSTRIL EVERY DAY FOR STUFFY NOSE OR DRAINAGE 08/12/18   Bobbitt, Heywood Iles, MD  gabapentin (NEURONTIN)  300 MG capsule Take 300 mg by mouth 3 (three) times daily.    [provider]  guaiFENesin (MUCINEX) 600 MG 12 hr tablet Take 600 mg by mouth as needed.    [provider]  hydrocortisone cream 0.5 % Apply 1 application topically 2 (two) times daily. To hairline and brows    [provider]  hydrOXYzine (ATARAX) 50 MG tablet Take 50 mg by mouth 2 (two) times daily.    [provider]  ibuprofen (ADVIL) 200 MG tablet Take 400 mg by mouth every 6 (six) hours as needed for fever, headache,  mild pain, moderate pain or cramping.    [provider]  imipramine (TOFRANIL) 50 MG tablet TAKE TWO TABLETS BY MOUTH AT BEDTIME 12/25/22   Glean Salvo, NP  ketoconazole (NIZORAL) 2 % shampoo Apply 1 Application topically once. 10/12/23   [provider]  ketorolac (ACULAR) 0.5 % ophthalmic solution Place 1 drop into both eyes daily.  04/01/19   [provider]  lactulose (CHRONULAC) 10 GM/15ML solution Take 10 g by mouth daily as needed for mild constipation. 08/06/20   [provider]  medroxyPROGESTERone (DEPO-PROVERA) 150 MG/ML injection Inject 1 mL (150 mg total) into the muscle every 3 (three) months. 03/21/23   Anyanwu, Jethro Bastos, MD  metoprolol succinate (TOPROL-XL) 25 MG 24 hr tablet Take 1 tablet (25 mg total) by mouth in the morning and at bedtime. Please schedule an appointment for future refills. 10/15/23   Lanier Prude, MD  Multiple Vitamin (MULTIVITAMIN WITH MINERALS) TABS tablet Take 1 tablet by mouth daily.    [provider]  mupirocin ointment (BACTROBAN) 2 % 1 application by Tracheal Tube route daily. Applies to trach-stoma site    [provider]  nystatin (MYCOSTATIN) powder Apply 1 application topically 2 (two) times daily as needed (skin irritation). Small amount . Rash    [provider]  polyethylene glycol powder (MIRALAX) 17 GM/SCOOP powder Start taking 1 capful 3 times a day. Slowly cut back as needed until you have normal bowel movements. 03/13/20   Cardama, Amadeo Garnet, MD  PROMETHEGAN 12.5 MG suppository Place 12.5 mg rectally as needed. 11/04/22   [provider]  pseudoephedrine (SUDAFED) 120 MG 12 hr tablet Take 120 mg by mouth as needed.    [provider]  senna (SENOKOT) 8.6 MG tablet Take 1 tablet by mouth every other day.    [provider]  sertraline (ZOLOFT) 100 MG tablet Take 200 mg by mouth daily. 01/31/22   [provider]  sodium chloride irrigation 0.9 %  irrigation Flush with 50 mLs as directed 6 (six) times daily. 01/18/24     solifenacin (VESICARE) 10 MG tablet Take 10 mg by mouth daily.    [provider]  sucralfate (CARAFATE) 1 GM/10ML suspension Take 1 g by mouth 3 (three) times daily.     [provider]  topiramate (TOPAMAX) 50 MG tablet Take 1 tablet (50 mg total) by mouth at bedtime. 11/22/21   Glean Salvo, NP      Allergies    Bee venom, Tobramycin, Erythromycin, Trazodone, Trazodone and nefazodone, Morphine and codeine, and Vantin [cefpodoxime]    Review of Systems   Review of Systems  HENT:         Soreness at tracheostomy stoma  Respiratory:  Positive for chest tightness.     Physical Exam Updated Vital Signs BP 119/71   Pulse 96   Temp 98.1 F (36.7  C) (Oral)   Resp 16   Ht 5\' 2"  (1.575 m)   Wt 63.5 kg   SpO2 100%   BMI 25.61 kg/m  Physical Exam Vitals and nursing note reviewed.  Constitutional:      Appearance: She is not toxic-appearing.  HENT:     Head: Normocephalic and atraumatic.     Mouth/Throat:     Mouth: Mucous membranes are moist.     Pharynx: No oropharyngeal exudate or posterior oropharyngeal erythema.  Eyes:     General:        Right eye: No discharge.        Left eye: No discharge.     Conjunctiva/sclera: Conjunctivae normal.  Neck:   Cardiovascular:     Rate and Rhythm: Normal rate and regular rhythm.     Pulses: Normal pulses.     Heart sounds: Normal heart sounds. No murmur heard. Pulmonary:     Effort: Pulmonary effort is normal. No respiratory distress.     Breath sounds: Normal breath sounds. No wheezing or rales.  Abdominal:     General: Bowel sounds are normal. There is distension (Per patient distended at baseline.).     Palpations: Abdomen is soft.     Tenderness: There is no abdominal tenderness. There is no guarding or rebound.  Musculoskeletal:        General: No deformity.     Cervical back: Neck supple.     Right lower leg: Edema present.      Left lower leg: Edema present.     Comments: Swelling at baseline for patient.  Skin:    General: Skin is warm and dry.     Capillary Refill: Capillary refill takes less than 2 seconds.  Neurological:     General: No focal deficit present.     Mental Status: She is alert and oriented to person, place, and time. Mental status is at baseline.     Comments: Expressed thick tetraplegia at baseline.  Psychiatric:        Mood and Affect: Mood normal.     ED Results / Procedures / Treatments   Labs (all labs ordered are listed, but only abnormal results are displayed) Labs Reviewed  RESP PANEL BY RT-PCR (RSV, FLU A&B, COVID)  RVPGX2 - Abnormal; Notable for the following components:      Result Value   Influenza A by PCR POSITIVE (*)    All other components within normal limits  CBC - Abnormal; Notable for the following components:   RDW 15.9 (*)    All other components within normal limits  BASIC METABOLIC PANEL - Abnormal; Notable for the following components:   Sodium 134 (*)    Creatinine, Ser 0.43 (*)    All other components within normal limits    EKG None  Radiology DG Chest 1 View Result Date: 02/02/2024 CLINICAL DATA:  Lateral view for comparison with earlier chest x-ray EXAM: CHEST  1 VIEW COMPARISON:  Chest x-ray earlier today FINDINGS: No visible confluent airspace opacity.  No effusions. IMPRESSION: No visible confluent opacity. Electronically Signed   By: Charlett Nose M.D.   On: 02/02/2024 00:22   DG Chest Port 1 View Result Date: 02/01/2024 CLINICAL DATA:  Upper respiratory infection, pneumonia EXAM: PORTABLE CHEST 1 VIEW COMPARISON:  09/13/2022 FINDINGS: Single frontal view of the chest is obtained, with the patient rotated toward the left. Stable tracheostomy tube. Cardiac silhouette is enlarged. No acute airspace disease, effusion, or pneumothorax. No acute bony abnormalities. IMPRESSION:  1. No acute intrathoracic process. Electronically Signed   By: Sharlet Salina  M.D.   On: 02/01/2024 23:43    Procedures Procedures    Medications Ordered in ED Medications - No data to display  ED Course/ Medical Decision Making/ A&P                                 Medical Decision Making 37 year old female with chronic quadriplegia from CVA at age 81.  Reassuring vitals on intake at baseline with 0.5 L O2 by trach mask.  Cardiopulmonary abdominal symptoms are benign.  Patient neurologic baseline.  No evidence of infection around stoma.    Amount and/or Complexity of Data Reviewed Labs: ordered.    Details: RVP + for influenza A.  CBC and BMP unremarkable. Radiology: ordered.    Details: Chest x-ray without pneumonia.    Clinical patient was consistent with diagnosed influenza A infection.  Clinical concern for emergent underlying condition of this patient's presentation of warfarin ED workup and patient management is exceedingly low.  Veronica Huff  voiced understanding of her medical evaluation and treatment plan. Each of their questions answered to their expressed satisfaction.  Return precautions were given.  Patient is well-appearing, stable, and was discharged in good condition.  This chart was dictated using voice recognition software, Dragon. Despite the best efforts of this provider to proofread and correct errors, errors may still occur which can change documentation meaning.        Final Clinical Impression(s) / ED Diagnoses Final diagnoses:  Influenza A    Rx / DC Orders ED Discharge Orders     None         Paris Lore, PA-C 02/02/24 0308    Charlynne Pander, MD 02/05/24 [phone]

## 2024-02-02 ENCOUNTER — Emergency Department (HOSPITAL_COMMUNITY)

## 2024-02-02 DIAGNOSIS — J101 Influenza due to other identified influenza virus with other respiratory manifestations: Secondary | ICD-10-CM | POA: Diagnosis not present

## 2024-02-02 LAB — BASIC METABOLIC PANEL
Anion gap: 8 (ref 5–15)
BUN: 12 mg/dL (ref 6–20)
CO2: 27 mmol/L (ref 22–32)
Calcium: 9.1 mg/dL (ref 8.9–10.3)
Chloride: 99 mmol/L (ref 98–111)
Creatinine, Ser: 0.43 mg/dL — ABNORMAL LOW (ref 0.44–1.00)
GFR, Estimated: >60 mL/min (ref >60)
Glucose, Bld: 95 mg/dL (ref 70–99)
Potassium: 4.3 mmol/L (ref 3.5–5.1)
Sodium: 134 mmol/L — ABNORMAL LOW (ref 135–145)

## 2024-02-02 LAB — RESP PANEL BY RT-PCR (RSV, FLU A&B, COVID)  RVPGX2
Influenza A by PCR: POSITIVE — AB
Influenza B by PCR: NEGATIVE
Resp Syncytial Virus by PCR: NEGATIVE
SARS Coronavirus 2 by RT PCR: NEGATIVE

## 2024-02-02 NOTE — Discharge Instructions (Addendum)
 You have influenza A.  This is likely the source of your symptoms.  You may discontinue the Augmentin previously prescribed.  Follow-up in the outpatient setting return to the ER with any severe symptoms.

## 2024-02-02 NOTE — ED Notes (Signed)
 Patient stated that she had a ride and no longer needed PTAR

## 2024-02-05 MED ORDER — METOPROLOL SUCCINATE ER 25 MG PO TB24: ORAL_TABLET | ORAL | 0 refills | Status: AC

## 2024-02-12 ENCOUNTER — Telehealth (INDEPENDENT_AMBULATORY_CARE_PROVIDER_SITE_OTHER): Payer: Medicare Other | Admitting: Internal Medicine

## 2024-02-12 ENCOUNTER — Telehealth: Payer: Self-pay | Admitting: Gastroenterology

## 2024-02-12 DIAGNOSIS — K219 Gastro-esophageal reflux disease without esophagitis: Secondary | ICD-10-CM | POA: Diagnosis not present

## 2024-02-12 DIAGNOSIS — R110 Nausea: Secondary | ICD-10-CM | POA: Diagnosis not present

## 2024-02-12 DIAGNOSIS — K5900 Constipation, unspecified: Secondary | ICD-10-CM

## 2024-02-12 DIAGNOSIS — R109 Unspecified abdominal pain: Secondary | ICD-10-CM

## 2024-02-12 NOTE — Patient Instructions (Signed)
 Glad you are feeling better   If your blood pressure at your visit was 140/90 or greater, please contact your primary care physician to follow up on this.  _______________________________________________________  If you are age 37 or older, your body mass index should be between 23-30. Your There is no height or weight on file to calculate BMI. If this is out of the aforementioned range listed, please consider follow up with your Primary Care Provider.  If you are age 54 or younger, your body mass index should be between 19-25. Your There is no height or weight on file to calculate BMI. If this is out of the aformentioned range listed, please consider follow up with your Primary Care Provider.   ________________________________________________________  The [facility] providers would like to encourage you to use [facility] to communicate with providers for non-urgent requests or questions.  Due to long hold times on the telephone, sending your provider a message by [facility] may be a faster and more efficient way to get a response.  Please allow 48 business hours for a response.  Please remember that this is for non-urgent requests.  _______________________________________________________  Thank you for entrusting me with your care and for choosing [facility], Dr. Eulah Pont

## 2024-02-12 NOTE — Telephone Encounter (Signed)
 Changed appointment and advised patient.

## 2024-02-12 NOTE — Progress Notes (Signed)
 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 Madilyn Fireman in the past; she did not have any ulcer at that time.Denies prior colonoscopy. Denies fam hx of GI cancer. Mother has IBS.   Interval History: Patient was recently diagnosed with the flu and is now on the tail end of the infection. She was treated with Levaquin and then switched to doxycycline, which was then switched to Augmentin. The Augmentin seemed to help clear up her stomach as well with improvement in her ab pain. Patient's stools are currently formed and large. She is still on Miralax and Colace with senna PRN. Her ab pain is better currently. Weight has increased about 10 lbs, which she is frustrated about. She is not sure how she is going to lose weight. She is doing okay from a swallowing standpoint. Three years ago she started taking her meds in puree, which has really helped. She now uses [employer], which seems to help as well. She has chronic nausea but not really since she finished her antibiotic. She still uses carafate. Denies blood in the stools. She is not really doing suppositories anymore because it wasn't really doing anything. Sometimes her nurse does digital stimulation. She only has acid reflux when she is on antibiotics. Patient was prescribed Wegovy  Wt Readings from Last 3 Encounters:  02/01/24 140 lb (63.5 kg)  01/14/24 140 lb (63.5 kg)  08/09/23 130 lb 1.1 oz (59 kg)   Past Medical History:  Diagnosis Date   Anxiety    CVA (cerebral infarction)    Ischemic brainstem stroke in 2000 at age 47 due to unknown reasons--quadriplegic after stroke.   Depression    Zoloft has helped-not having any current depression   Edema extremities    Feet and legs-mostly in feet   Headache(784.0)    Prior to imipramine-none currently   Pneumonia     Quadriplegia (HCC)    Secondary to stroke at age 33-has trach-lives with boyfriend-motorized w/c-joystick to chin to drive w/c--I & O cath thru suprapubic stoma - has nursing care 16 hrs daily and boyfriend able to help pt the other hours of day.   Recurrent UTI (urinary tract infection)    Due to suprapubic stoma, repetitive catheterizations   Sleep apnea    On ventilator at night -has oxygen but rarely needs unless she has a cold and sats drop   Stroke [facility])      Past Surgical History:  Procedure Laterality Date   BLADDER STONE REMOVAL  2024   BLADDER SURGERY  2009,2012   BRONCHOSCOPY  2006   CHOLECYSTECTOMY  11/18/2012   Procedure: LAPAROSCOPIC CHOLECYSTECTOMY;  Surgeon: Axel Filler, MD;  Location: [facility];  Service: General;  Laterality: N/A;   ESOPHAGOGASTRODUODENOSCOPY     GASTROSTOMY TUBE PLACEMENT  2000   ILEOSTOMY  2006   MICROLARYNGOSCOPY WITH LASER AND BALLOON DILATION N/A 09/27/2016   Procedure: MICROLARYNGOSCOPY WITH LASER AND BALLOON DILATION;  Surgeon: Christia Reading, MD;  Location: [facility] OR;  Service: ENT;  Laterality: N/A;  micro direct laryngoscopy with co2 laser balloon dilation of trachea   REMOVAL OF GASTROSTOMY TUBE  2000   SPINE SURGERY  2004,2011   TENDON REPAIR  2012   Left wrist   THROAT SURGERY  2004   TRACHEOSTOMY  2000   wirst surgery Left    WISDOM  TOOTH EXTRACTION  2008   Family History  Problem Relation Age of Onset   Heart disease Paternal Grandfather    Diabetes Maternal Grandfather    Heart disease Maternal Grandfather    Other Father        tumor removed from kidney in 2024   Diabetes Mother    Hypertension Mother    Varicose Veins Mother    Other Sister        overdose   Cancer Maternal Aunt        lung   Social History   Tobacco Use   Smoking status: Never   Smokeless tobacco: Never  Vaping Use   Vaping status: Never Used  Substance Use Topics   Alcohol use: No    Alcohol/week: 0.0 standard drinks of alcohol   Drug use: No     Types: Marijuana   Current Outpatient Medications  Medication Sig Dispense Refill   albuterol (PROVENTIL) (2.5 MG/3ML) 0.083% nebulizer solution Take 3 mLs (2.5 mg total) by nebulization every 6 (six) hours as needed for wheezing or shortness of breath. 360 mL 5   albuterol (PROVENTIL) (2.5 MG/3ML) 0.083% nebulizer solution Take 3 mLs (2.5 mg total) by nebulization every 6 (six) hours as needed for wheezing or shortness of breath. 180 mL 3   albuterol (PROVENTIL) (2.5 MG/3ML) 0.083% nebulizer solution Take 3 mL (2.5 mg total) by nebulization every 6 (six) hours as needed for wheezing or shortness of breath. 180 mL 3   albuterol (VENTOLIN HFA) 108 (90 Base) MCG/ACT inhaler Inhale into the lungs.     baclofen (LIORESAL) 20 MG tablet Take 2 tablets (40 mg total) by mouth 4 (four) times daily. Take 1 tablet 4 times daily 245 tablet 11   cephALEXin (KEFLEX) 250 MG capsule Take 250 mg by mouth daily.     cetirizine (ZYRTEC) 10 MG tablet Take 10 mg by mouth daily.     dantrolene (DANTRIUM) 100 MG capsule TAKE 1 CAPSULE BY MOUTH 3 TIMES DAILY. 270 capsule 3   diphenhydrAMINE (BENADRYL) 25 MG tablet Take 1 tablet (25 mg total) by mouth every 6 (six) hours as needed for itching or allergies. Take with Levofloxacin to prevent allergic reaction 30 tablet 0   Docusate Sodium (DSS) 100 MG CAPS Take 100 mg by mouth 2 (two) times daily with a meal.     EPIPEN 2-PAK 0.3 MG/0.3ML SOAJ injection Inject 0.3 mg into the muscle daily as needed for anaphylaxis.  1   fluconazole (DIFLUCAN) 150 MG tablet Take 150 mg by mouth once.     fluticasone (FLONASE) 50 MCG/ACT nasal spray INSERT 2 SPRAYS IN EACH NOSTRIL EVERY DAY FOR STUFFY NOSE OR DRAINAGE 16 g 5   gabapentin (NEURONTIN) 300 MG capsule Take 300 mg by mouth 3 (three) times daily.     guaiFENesin (MUCINEX) 600 MG 12 hr tablet Take 600 mg by mouth as needed.     hydrocortisone cream 0.5 % Apply 1 application topically 2 (two) times daily. To hairline and brows      hydrOXYzine (ATARAX) 50 MG tablet Take 50 mg by mouth 2 (two) times daily.     ibuprofen (ADVIL) 200 MG tablet Take 400 mg by mouth every 6 (six) hours as needed for fever, headache, mild pain, moderate pain or cramping.     imipramine (TOFRANIL) 50 MG tablet TAKE TWO TABLETS BY MOUTH AT BEDTIME 60 tablet 10   ketoconazole (NIZORAL) 2 % shampoo Apply 1 Application topically once.  ketorolac (ACULAR) 0.5 % ophthalmic solution Place 1 drop into both eyes daily.      lactulose (CHRONULAC) 10 GM/15ML solution Take 10 g by mouth daily as needed for mild constipation.     medroxyPROGESTERone (DEPO-PROVERA) 150 MG/ML injection Inject 1 mL (150 mg total) into the muscle every 3 (three) months. 1 mL 3   metoprolol succinate (TOPROL-XL) 25 MG 24 hr tablet Take 1 tablet (25 mg total) by mouth in the morning and at bedtime. Must keep 4/7 appointment for further refills. 30 tablet 0   Multiple Vitamin (MULTIVITAMIN WITH MINERALS) TABS tablet Take 1 tablet by mouth daily.     mupirocin ointment (BACTROBAN) 2 % 1 application by Tracheal Tube route daily. Applies to trach-stoma site     nystatin (MYCOSTATIN) powder Apply 1 application topically 2 (two) times daily as needed (skin irritation). Small amount . Rash     polyethylene glycol powder (MIRALAX) 17 GM/SCOOP powder Start taking 1 capful 3 times a day. Slowly cut back as needed until you have normal bowel movements. 255 g 0   PROMETHEGAN 12.5 MG suppository Place 12.5 mg rectally as needed.     pseudoephedrine (SUDAFED) 120 MG 12 hr tablet Take 120 mg by mouth as needed.     senna (SENOKOT) 8.6 MG tablet Take 1 tablet by mouth every other day.     sertraline (ZOLOFT) 100 MG tablet Take 200 mg by mouth daily.     sodium chloride irrigation 0.9 % irrigation Flush with 50 mLs as directed 6 (six) times daily. 5000 mL 3   solifenacin (VESICARE) 10 MG tablet Take 10 mg by mouth daily.     sucralfate (CARAFATE) 1 GM/10ML suspension Take 1 g by mouth 3 (three)  times daily.      topiramate (TOPAMAX) 50 MG tablet Take 1 tablet (50 mg total) by mouth at bedtime. 90 tablet 3   Current Facility-Administered Medications  Medication Dose Route Frequency Provider Last Rate Last Admin   incobotulinumtoxinA (XEOMIN) 100 units injection 400 Units  400 Units Intramuscular Q90 days Levert Feinstein, MD   400 Units at 10/28/22 2122   Allergies  Allergen Reactions   Bee Venom Shortness Of Breath, Swelling and Anaphylaxis    SHOCK   Tobramycin Hives and Rash   Erythromycin Other (See Comments)    UNSPECIFIED REACTION     Trazodone Other (See Comments)    UNSPECIFIED REACTION  Eyes shake   Trazodone And Nefazodone     UNSPECIFIED REACTION    Morphine And Codeine Other (See Comments)    "GOES CRAZY"    Vantin [Cefpodoxime] Palpitations   Physical Exam: Not performed as part of telemedicine encounter  Labs 04/2021: CBC nml. BMP unremarkable.  Labs 05/2022: CMP with nml LFTs.  Labs 07/2023: CBC with low plts of 144.   Labs 01/2024: CBC nml. BMP with mildly low Na of 134.   CT A/P w/contrast 03/13/20: IMPRESSION: 1. Mildly prominent fluid in stool filled colon to the level of the rectum without definite evidence of bowel obstruction. This could be due to constipation. 2. Layering bladder calculi with diffuse mild bladder wall thickening which could be due to chronic cystitis.  CT A/P w/o contrast 06/15/22: IMPRESSION: 1. No evidence of bowel obstruction. Evidence of prior colonic surgery. Moderate volume stool throughout the colon suggest constipation. 2. A stoma connecting the bladder to the RIGHT abdominal wall without complication. Depending stones within the bladder unchanged from prior. No hydronephrosis or obstructive uropathy. 3. Postcholecystectomy. 4.  Stable decubitus thickening posterior to the sacrum. No acute findings.  CT A/P w/contrast 08/09/23: IMPRESSION: 1. No acute trauma related findings in the abdomen or pelvis. 2. Mild  hepatosplenomegaly with mild hepatic steatosis. 3. Constipation. 4. Chronic bladder wall thickening with a stoma extending from the right anterolateral bladder wall out through the right lower abdominal wall. 5. Abutment of a portion of the sigmoid colon and the top of the bladder, but no formed fistula. 6. There appears to be a fistula attempting to form between the anterior bladder dome and a more proximal portion of the sigmoid, seen previously. 7. Stable clustered stones in the right posterior aspect of the bladder, unchanged. 8. Multiple off midline infraumbilical anterior wall fat hernias.  Barium swallow 11/02/20: IMPRESSION: Negative esophagram.  ASSESSMENT AND PLAN: Constipation Abdominal pain - resolved Nausea - improved GERD Patient presents with longstanding constipation issues, suspected to be related to her quadriplegia and limited mobility. We had originally planned for a colonoscopy previously, but patient's GI symptoms have overall gotten better recently so she is no longer interested in getting a colonoscopy at this time. Patient recently took some antibiotics to help with her respiratory infection, and the Augmentin in particular seemed to help significantly with her ab pain and nausea. This could suggest that she had a component of SIBO (small intestinal bacterial overgrowth) as a contributor to her symptoms. Will continue her currently constipation regimen and sucralfate PRN. She is thinking about starting Wegovy to help with weight loss, and I did warn her about the potential GI side effects of this medication. She will consider further and decide if Reginal Lutes is right for her.  - Cont Miralax/Colace with senna as needed - Patient will consider starting Wegovy in the future to help with weight loss - Cont sucralfate PRN - Declined colonoscopy - RTC 2-3 months. Can consider checking liver labs in the future when she comes in person due to presence of hepatosplenomegaly  with fatty liver seen on prior CT imaging  Eulah Pont, MD  I spent 35 minutes of time, including in depth chart review, independent review of results as outlined above, communicating results with the patient directly, face-to-face time with the patient, coordinating care, ordering studies and medications as appropriate, and documentation.    I connected with  Jeronimo Norma on 02/12/24 by a video enabled telemedicine application and verified that I am speaking with the correct person using two identifiers. She is located at home, and I am located at the [facility] office   I discussed the limitations of evaluation and management by telemedicine. The patient expressed understanding and agreed to proceed.

## 2024-02-12 NOTE — Telephone Encounter (Signed)
 Good morning Veronica Huff  The following patient has an appointment today at 210pm and wants to know would you be willing to do a phone appointment. She is just getting over the flu and she still has some lingering symptoms. She doesn't want to lose the appointment because of this. Please advise. Thank you so much.

## 2024-02-21 ENCOUNTER — Other Ambulatory Visit (HOSPITAL_COMMUNITY): Payer: Self-pay

## 2024-02-21 ENCOUNTER — Other Ambulatory Visit: Payer: Self-pay | Admitting: Obstetrics & Gynecology

## 2024-02-21 ENCOUNTER — Encounter: Payer: Self-pay | Admitting: Obstetrics & Gynecology

## 2024-02-21 DIAGNOSIS — Z3042 Encounter for surveillance of injectable contraceptive: Secondary | ICD-10-CM

## 2024-02-21 NOTE — Progress Notes (Deleted)
  Cardiology Office Note:  .   Date:  02/21/2024  ID:  Veronica Huff, DOB 1986-08-03, MRN [id number] PCP: Nona Dell, NP  [facility] Providers Cardiologist:  None Electrophysiologist:  Lanier Prude, MD {  History of Present Illness: .   Veronica Huff is a 38 y.o. female w/PMHx of  Brainstem stroke at age 58 (unknown etiology) > Quadriplegic  Chronic trach/nocturnal vent support, supplemental O2 follows with Dr. Craige Cotta Recurrent URIs/PNAs Neurogenic bladder, Recurrent UTIs (suprapubic catheter)  She had a video visit with Dr. Lalla Brothers 12/13/22, on BB for symptoms of her resting ST. Reports of low BP, suspect 2/2 dehydration advised adequate hydration  ER 02/01/24, Influenza A +  Today's visit is scheduled as an annual visit  ROS:   *** symptoms   Studies Reviewed: Marland Kitchen    EKG done today and reviewed by myself:  ***    Monitor Jan 2022 HR 75-137, average 100 bpm. Rare supraventricular and ventricular ectopy. No sustained arrhythmias.   Risk Assessment/Calculations:    Physical Exam:   VS:  There were no vitals taken for this visit.   Wt Readings from Last 3 Encounters:  02/01/24 140 lb (63.5 kg)  01/14/24 140 lb (63.5 kg)  08/09/23 130 lb 1.1 oz (59 kg)    GEN: Well nourished, well developed in no acute distress NECK: *** trach, No JVD; No carotid bruits CARDIAC: ***RRR, no murmurs, rubs, gallops RESPIRATORY:  *** CTA b/l without rales, wheezing or rhonchi  ABDOMEN: Soft, non-tender, non-distended EXTREMITIES: ***  No edema; No deformity   ASSESSMENT AND PLAN: .    Sinus tachycardia ***     {Are you ordering a CV Procedure (e.g. stress test, cath, DCCV, TEE, etc)?   Press F2        :[id number]}     Dispo: ***  Signed, Sheilah Pigeon, PA-C

## 2024-02-25 ENCOUNTER — Ambulatory Visit: Admitting: Physician Assistant

## 2024-03-07 ENCOUNTER — Other Ambulatory Visit (HOSPITAL_COMMUNITY): Payer: Self-pay | Admitting: Nurse Practitioner

## 2024-03-07 ENCOUNTER — Other Ambulatory Visit (HOSPITAL_COMMUNITY)
Admission: RE | Admit: 2024-03-07 | Discharge: 2024-03-07 | Disposition: A | Discharge status: Home / Self Care | Attending: Nurse Practitioner | Admitting: Nurse Practitioner

## 2024-03-07 DIAGNOSIS — R300 Dysuria: Secondary | ICD-10-CM | POA: Diagnosis present

## 2024-03-07 LAB — URINALYSIS, ROUTINE W REFLEX MICROSCOPIC
Bacteria, UA: NONE SEEN
Bilirubin Urine: NEGATIVE
Glucose, UA: NEGATIVE mg/dL
Ketones, ur: NEGATIVE mg/dL
Nitrite: POSITIVE — AB
Protein, ur: NEGATIVE mg/dL
Specific Gravity, Urine: 1.003 — ABNORMAL LOW (ref 1.005–1.030)
pH: 7 (ref 5.0–8.0)

## 2024-03-10 ENCOUNTER — Other Ambulatory Visit: Payer: Self-pay

## 2024-03-10 LAB — URINE CULTURE: Culture: 50000 — AB

## 2024-03-12 ENCOUNTER — Other Ambulatory Visit: Payer: Self-pay

## 2024-03-13 ENCOUNTER — Other Ambulatory Visit: Payer: Self-pay

## 2024-04-03 ENCOUNTER — Other Ambulatory Visit (HOSPITAL_COMMUNITY)
Admission: AD | Admit: 2024-04-03 | Discharge: 2024-04-03 | Disposition: A | Discharge status: Home / Self Care | Source: Ambulatory Visit

## 2024-04-03 DIAGNOSIS — R54 Age-related physical debility: Secondary | ICD-10-CM | POA: Insufficient documentation

## 2024-04-03 DIAGNOSIS — I10 Essential (primary) hypertension: Secondary | ICD-10-CM | POA: Insufficient documentation

## 2024-04-03 LAB — LIPID PANEL
Cholesterol: 201 mg/dL — ABNORMAL HIGH (ref 0–200)
HDL: 42 mg/dL (ref >40)
LDL Cholesterol: 126 mg/dL — ABNORMAL HIGH (ref 0–99)
Total CHOL/HDL Ratio: 4.8 ratio
Triglycerides: 165 mg/dL — ABNORMAL HIGH (ref <150)
VLDL: 33 mg/dL (ref 0–40)

## 2024-04-03 LAB — COMPREHENSIVE METABOLIC PANEL WITH GFR
ALT: 23 U/L (ref 0–44)
AST: 25 U/L (ref 15–41)
Albumin: 3.7 g/dL (ref 3.5–5.0)
Alkaline Phosphatase: 66 U/L (ref 38–126)
Anion gap: 9 (ref 5–15)
BUN: 12 mg/dL (ref 6–20)
CO2: 27 mmol/L (ref 22–32)
Calcium: 9.2 mg/dL (ref 8.9–10.3)
Chloride: 101 mmol/L (ref 98–111)
Creatinine, Ser: 0.38 mg/dL — ABNORMAL LOW (ref 0.44–1.00)
GFR, Estimated: >60 mL/min (ref >60)
Glucose, Bld: 125 mg/dL — ABNORMAL HIGH (ref 70–99)
Potassium: 3.5 mmol/L (ref 3.5–5.1)
Sodium: 137 mmol/L (ref 135–145)
Total Bilirubin: 0.4 mg/dL (ref 0.0–1.2)
Total Protein: 7 g/dL (ref 6.5–8.1)

## 2024-04-03 LAB — IRON AND TIBC
Iron: 73 ug/dL (ref 28–170)
Saturation Ratios: 17 % (ref 10.4–31.8)
TIBC: 440 ug/dL (ref 250–450)
UIBC: 367 ug/dL

## 2024-04-03 LAB — CBC
HCT: 43 % (ref 36.0–46.0)
Hemoglobin: 14.1 g/dL (ref 12.0–15.0)
MCH: 28 pg (ref 26.0–34.0)
MCHC: 32.8 g/dL (ref 30.0–36.0)
MCV: 85.5 fL (ref 80.0–100.0)
Platelets: 169 10*3/uL (ref 150–400)
RBC: 5.03 MIL/uL (ref 3.87–5.11)
RDW: 14.5 % (ref 11.5–15.5)
WBC: 5.8 10*3/uL (ref 4.0–10.5)
nRBC: 0 % (ref 0.0–0.2)

## 2024-04-03 LAB — VITAMIN B12: Vitamin B-12: 175 pg/mL — ABNORMAL LOW (ref 180–914)

## 2024-04-03 LAB — HEMOGLOBIN A1C
Hgb A1c MFr Bld: 4 % — ABNORMAL LOW (ref 4.8–5.6)
Mean Plasma Glucose: 68.1 mg/dL

## 2024-04-03 LAB — FOLATE: Folate: 11.3 ng/mL (ref >5.9)

## 2024-04-03 LAB — MAGNESIUM: Magnesium: 2.1 mg/dL (ref 1.7–2.4)

## 2024-04-03 LAB — TSH: TSH: 2.246 u[IU]/mL (ref 0.350–4.500)

## 2024-04-08 ENCOUNTER — Other Ambulatory Visit (HOSPITAL_COMMUNITY): Payer: Self-pay

## 2024-04-08 ENCOUNTER — Other Ambulatory Visit: Payer: Self-pay

## 2024-04-11 ENCOUNTER — Ambulatory Visit (INDEPENDENT_AMBULATORY_CARE_PROVIDER_SITE_OTHER): Admitting: Gastroenterology

## 2024-04-11 ENCOUNTER — Encounter: Payer: Self-pay | Admitting: Gastroenterology

## 2024-04-11 VITALS — BP 118/72 | HR 50 | Ht 62.0 in

## 2024-04-11 DIAGNOSIS — K5904 Chronic idiopathic constipation: Secondary | ICD-10-CM

## 2024-04-11 DIAGNOSIS — K219 Gastro-esophageal reflux disease without esophagitis: Secondary | ICD-10-CM | POA: Diagnosis not present

## 2024-04-11 MED ORDER — SUCRALFATE 1 GM/10ML PO SUSP: 1.0000 g | Freq: Three times a day (TID) | ORAL | 2 refills | Status: AC

## 2024-04-11 NOTE — Patient Instructions (Addendum)
 Cont Miralax /Colace with senna as needed  Cont sucralfate  as needed Continue seeing dietitian  _______________________________________________________  If your blood pressure at your visit was 140/90 or greater, please contact your primary care physician to follow up on this.  _______________________________________________________  If you are age 37 or older, your body mass index should be between 23-30. Your Body mass index is 25.61 kg/m. If this is out of the aforementioned range listed, please consider follow up with your Primary Care Provider.  If you are age 68 or younger, your body mass index should be between 19-25. Your Body mass index is 25.61 kg/m. If this is out of the aformentioned range listed, please consider follow up with your Primary Care Provider.   ________________________________________________________  The [facility] providers would like to encourage you to use MYCHART to communicate with providers for non-urgent requests or questions.  Due to long hold times on the telephone, sending your provider a message by [facility] may be a faster and more efficient way to get a response.  Please allow 48 business hours for a response.  Please remember that this is for non-urgent requests.  _______________________________________________________  Thank you for trusting me with your gastrointestinal care. Deanna May, RNP

## 2024-04-11 NOTE — Progress Notes (Signed)
 Chief Complaint:follow-up, abd pain, nausea Primary GI Doctor: Dr. 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 Rosaline Coma in GI office on 02/12/24 for follow-up of constipation.  She had an EGD with Dr. Sabra Cramp in the past; she did not have any ulcer at that time.Denies prior colonoscopy. Denies fam hx of GI cancer. Mother has IBS.   Interval History   Patient  has chronic constipation  and currently uses Miralax  every other day along with Colace three times a day. She complains of generalized abdominal pain that starts under belly button and radiates across. She states she thinks it Trayshawn Durkin be gas trapped.  She states the sucralfate  twice daily helps with both the abdominal pain and nausea, she states she has been out of it two days and the symptoms returned.      She decided to not start the [facility] and started working with dietitian to help with weight loss. She did instruct her to start eating more fruits and vegetables which she thinks Makylah Bossard have increased her gas and bloat.  Wt Readings from Last 3 Encounters:  02/01/24 140 lb (63.5 kg)  01/14/24 140 lb (63.5 kg)  08/09/23 130 lb 1.1 oz (59 kg)    Past Medical History:  Diagnosis Date   Anxiety    CVA (cerebral infarction)    Ischemic brainstem stroke in 2000 at age 49 due to unknown reasons--quadriplegic after stroke.   Depression    Zoloft  has helped-not having any current depression   Edema extremities    Feet and legs-mostly in feet   Headache(784.0)    Prior to imipramine -none currently   Pneumonia    Quadriplegia (HCC)    Secondary to stroke at age 38-has trach-lives with boyfriend-motorized w/c-joystick to chin to drive w/c--I & O cath thru suprapubic stoma - has nursing care 16 hrs daily and boyfriend able to help pt the other hours of day.   Recurrent UTI (urinary tract infection)     Due to suprapubic stoma, repetitive catheterizations   Sleep apnea    On ventilator at night -has oxygen but rarely needs unless she has a cold and sats drop   Stroke [facility])     Past Surgical History:  Procedure Laterality Date   BLADDER STONE REMOVAL  2024   BLADDER SURGERY  2009,2012   BRONCHOSCOPY  2006   CHOLECYSTECTOMY  11/18/2012   Procedure: LAPAROSCOPIC CHOLECYSTECTOMY;  Surgeon: Shela Derby, MD;  Location: [facility] ORS;  Service: General;  Laterality: N/A;   ESOPHAGOGASTRODUODENOSCOPY     GASTROSTOMY TUBE PLACEMENT  2000   ILEOSTOMY  2006   MICROLARYNGOSCOPY WITH LASER AND BALLOON DILATION N/A 09/27/2016   Procedure: MICROLARYNGOSCOPY WITH LASER AND BALLOON DILATION;  Surgeon: Virgina Grills, MD;  Location: [facility] OR;  Service: ENT;  Laterality: N/A;  micro direct laryngoscopy with co2 laser balloon dilation of trachea   REMOVAL OF GASTROSTOMY TUBE  2000   SPINE SURGERY  2004,2011   TENDON REPAIR  2012   Left wrist   THROAT SURGERY  2004   TRACHEOSTOMY  2000   wirst surgery Left    WISDOM TOOTH EXTRACTION  2008    Current Outpatient Medications  Medication Sig Dispense Refill   albuterol  (PROVENTIL ) (2.5 MG/3ML) 0.083% nebulizer solution Take 3 mLs (2.5 mg total) by nebulization every 6 (six) hours as needed for wheezing or shortness of breath.  360 mL 5   albuterol  (PROVENTIL ) (2.5 MG/3ML) 0.083% nebulizer solution Take 3 mLs (2.5 mg total) by nebulization every 6 (six) hours as needed for wheezing or shortness of breath. 180 mL 3   albuterol  (PROVENTIL ) (2.5 MG/3ML) 0.083% nebulizer solution Take 3 mL (2.5 mg total) by nebulization every 6 (six) hours as needed for wheezing or shortness of breath. 180 mL 3   baclofen  (LIORESAL ) 20 MG tablet Take 2 tablets (40 mg total) by mouth 4 (four) times daily. Take 1 tablet 4 times daily 245 tablet 11   cephALEXin  (KEFLEX ) 250 MG capsule Take 250 mg by mouth daily.     cetirizine (ZYRTEC) 10 MG tablet Take 10 mg by mouth daily.      cyanocobalamin (VITAMIN B12) 1000 MCG tablet Take 1,000 mcg by mouth daily.     dantrolene  (DANTRIUM ) 100 MG capsule TAKE 1 CAPSULE BY MOUTH 3 TIMES DAILY. 270 capsule 3   diphenhydrAMINE  (BENADRYL ) 25 MG tablet Take 1 tablet (25 mg total) by mouth every 6 (six) hours as needed for itching or allergies. Take with Levofloxacin  to prevent allergic reaction 30 tablet 0   Docusate Sodium (DSS) 100 MG CAPS Take 100 mg by mouth 2 (two) times daily with a meal.     EPIPEN  2-PAK 0.3 MG/0.3ML SOAJ injection Inject 0.3 mg into the muscle daily as needed for anaphylaxis.  1   fluticasone  (FLONASE ) 50 MCG/ACT nasal spray INSERT 2 SPRAYS IN EACH NOSTRIL EVERY DAY FOR STUFFY NOSE OR DRAINAGE 16 g 5   furosemide (LASIX) 8 MG/ML solution Take by mouth daily.     gabapentin  (NEURONTIN ) 300 MG capsule Take 300 mg by mouth 3 (three) times daily.     hydrocortisone  cream 0.5 % Apply 1 application topically 2 (two) times daily. To hairline and brows     hydrOXYzine (ATARAX) 50 MG tablet Take 50 mg by mouth 2 (two) times daily.     ibuprofen  (ADVIL ) 200 MG tablet Take 400 mg by mouth every 6 (six) hours as needed for fever, headache, mild pain, moderate pain or cramping.     imipramine  (TOFRANIL ) 50 MG tablet TAKE TWO TABLETS BY MOUTH AT BEDTIME 60 tablet 10   ketoconazole (NIZORAL) 2 % shampoo Apply 1 Application topically once.     ketorolac  (ACULAR ) 0.5 % ophthalmic solution Place 1 drop into both eyes daily.      lactulose  (CHRONULAC ) 10 GM/15ML solution Take 10 g by mouth daily as needed for mild constipation.     medroxyPROGESTERone  Acetate 150 MG/ML SUSY INJECT 1 ML (150 MG TOTAL) INTO THE MUSCLE EVERY THREE (THREE) MONTHS 1 mL 2   metoprolol  succinate (TOPROL -XL) 25 MG 24 hr tablet Take 1 tablet (25 mg total) by mouth in the morning and at bedtime. Must keep 4/7 appointment for further refills. 30 tablet 0   Multiple Vitamin (MULTIVITAMIN WITH MINERALS) TABS tablet Take 1 tablet by mouth daily.     mupirocin   ointment (BACTROBAN ) 2 % 1 application by Tracheal Tube route daily. Applies to trach-stoma site     nystatin (MYCOSTATIN) powder Apply 1 application topically 2 (two) times daily as needed (skin irritation). Small amount . Rash     polyethylene glycol powder (MIRALAX ) 17 GM/SCOOP powder Start taking 1 capful 3 times a day. Slowly cut back as needed until you have normal bowel movements. 255 g 0   PROMETHEGAN 12.5 MG suppository Place 12.5 mg rectally as needed.     pseudoephedrine (SUDAFED) 120 MG 12 hr  tablet Take 120 mg by mouth as needed.     senna (SENOKOT) 8.6 MG tablet Take 1 tablet by mouth every other day.     sertraline  (ZOLOFT ) 100 MG tablet Take 200 mg by mouth daily.     sodium chloride  irrigation 0.9 % irrigation Flush with 50 mLs as directed 6 (six) times daily. 5000 mL 3   solifenacin (VESICARE) 10 MG tablet Take 10 mg by mouth daily.     topiramate  (TOPAMAX ) 50 MG tablet Take 1 tablet (50 mg total) by mouth at bedtime. 90 tablet 3   albuterol  (VENTOLIN  HFA) 108 (90 Base) MCG/ACT inhaler Inhale into the lungs.     fluconazole  (DIFLUCAN ) 150 MG tablet Take 150 mg by mouth once. (Patient not taking: Reported on 04/11/2024)     guaiFENesin (MUCINEX) 600 MG 12 hr tablet Take 600 mg by mouth as needed. (Patient not taking: Reported on 04/11/2024)     sucralfate  (CARAFATE ) 1 GM/10ML suspension Take 10 mLs (1 g total) by mouth 3 (three) times daily. 420 mL 2   Current Facility-Administered Medications  Medication Dose Route Frequency Provider Last Rate Last Admin   incobotulinumtoxinA  (XEOMIN ) 100 units injection 400 Units  400 Units Intramuscular Q90 days Phebe Brasil, MD   400 Units at 10/28/22 2122    Allergies as of 04/11/2024 - Review Complete 04/11/2024  Allergen Reaction Noted   Bee venom Shortness Of Breath, Swelling, and Anaphylaxis 08/09/2006   Tobramycin Hives and Rash 08/09/2006   Erythromycin Other (See Comments) 04/09/2012   Trazodone Other (See Comments) 08/20/2011    Trazodone and nefazodone  04/09/2012   Morphine and codeine Other (See Comments) 10/11/2012   Vantin [cefpodoxime] Palpitations 11/01/2020    Family History  Problem Relation Age of Onset   Heart disease Paternal Grandfather    Diabetes Maternal Grandfather    Heart disease Maternal Grandfather    Other Father        tumor removed from kidney in 2024   Diabetes Mother    Hypertension Mother    Varicose Veins Mother    Other Sister        overdose   Cancer Maternal Aunt        lung    Review of Systems:    Constitutional: No weight loss, fever, chills, weakness or fatigue HEENT: Eyes: No change in vision               Ears, Nose, Throat:  No change in hearing or congestion Skin: No rash or itching Cardiovascular: No chest pain, chest pressure or palpitations   Respiratory: No SOB or cough Gastrointestinal: See HPI and otherwise negative Genitourinary: No dysuria or change in urinary frequency Neurological: No headache, dizziness or syncope Musculoskeletal: No new muscle or joint pain Hematologic: No bleeding or bruising Psychiatric: No history of depression or anxiety    Physical Exam:  Vital signs: BP 118/72   Pulse (!) 50   Ht 5\' 2"  (1.575 m)   BMI 25.61 kg/m   Constitutional:   Pleasant  female appears to be in NAD, Well developed, Well nourished, alert and cooperative Throat: Oral cavity and pharynx without inflammation, swelling or lesion.  Respiratory: Respirations even and unlabored. Lungs clear to auscultation bilaterally.   No wheezes, crackles, or rhonchi.  Cardiovascular: Normal S1, S2. Regular rate and rhythm. No peripheral edema, cyanosis or pallor.  Gastrointestinal:  Soft, nondistended, nontender. No rebound or guarding. Normal bowel sounds. No appreciable masses or hepatomegaly. Rectal:  Not performed.  Msk: patient in motorized wheelchair Neurologic:  Alert and  oriented x4;  grossly normal neurologically.  Skin:   Dry and intact without significant  lesions or rashes. Psychiatric: Oriented to person, place and time. Demonstrates good judgement and reason without abnormal affect or behaviors.  RELEVANT LABS AND IMAGING: CBC    Latest Ref Rng & Units 04/03/2024    3:10 PM 02/01/2024   10:28 PM 08/09/2023    3:29 AM  CBC  WBC 4.0 - 10.5 K/uL 5.8  8.4    Hemoglobin 12.0 - 15.0 g/dL 91.4  78.2  95.6   Hematocrit 36.0 - 46.0 % 43.0  43.4  41.0   Platelets 150 - 400 K/uL 169  199       CMP     Latest Ref Rng & Units 04/03/2024    3:10 PM 02/02/2024   12:40 AM 08/09/2023    3:29 AM  CMP  Glucose 70 - 99 mg/dL 213  95  99   BUN 6 - 20 mg/dL 12  12  9    Creatinine 0.44 - 1.00 mg/dL 0.86  5.78  4.69   Sodium 135 - 145 mmol/L 137  134  139   Potassium 3.5 - 5.1 mmol/L 3.5  4.3  3.8   Chloride 98 - 111 mmol/L 101  99  98   CO2 22 - 32 mmol/L 27  27    Calcium 8.9 - 10.3 mg/dL 9.2  9.1    Total Protein 6.5 - 8.1 g/dL 7.0     Total Bilirubin 0.0 - 1.2 mg/dL 0.4     Alkaline Phos 38 - 126 U/L 66     AST 15 - 41 U/L 25     ALT 0 - 44 U/L 23        Lab Results  Component Value Date   TSH 2.246 04/03/2024   Labs 04/2021: CBC nml. BMP unremarkable.   Labs 05/2022: CMP with nml LFTs.   Labs 07/2023: CBC with low plts of 144.    Labs 01/2024: CBC nml. BMP with mildly low Na of 134.    CT A/P w/contrast 03/13/20: IMPRESSION: 1. Mildly prominent fluid in stool filled colon to the level of the rectum without definite evidence of bowel obstruction. This could be due to constipation. 2. Layering bladder calculi with diffuse mild bladder wall thickening which could be due to chronic cystitis.   CT A/P w/o contrast 06/15/22: IMPRESSION: 1. No evidence of bowel obstruction. Evidence of prior colonic surgery. Moderate volume stool throughout the colon suggest constipation. 2. A stoma connecting the bladder to the RIGHT abdominal wall without complication. Depending stones within the bladder unchanged from prior. No hydronephrosis or  obstructive uropathy. 3. Postcholecystectomy. 4. Stable decubitus thickening posterior to the sacrum. No acute findings.   CT A/P w/contrast 08/09/23: IMPRESSION: 1. No acute trauma related findings in the abdomen or pelvis. 2. Mild hepatosplenomegaly with mild hepatic steatosis. 3. Constipation. 4. Chronic bladder wall thickening with a stoma extending from the right anterolateral bladder wall out through the right lower abdominal wall. 5. Abutment of a portion of the sigmoid colon and the top of the bladder, but no formed fistula. 6. There appears to be a fistula attempting to form between the anterior bladder dome and a more proximal portion of the sigmoid, seen previously. 7. Stable clustered stones in the right posterior aspect of the bladder, unchanged. 8. Multiple off midline infraumbilical anterior wall fat hernias.   Barium swallow 11/02/20: IMPRESSION: Negative  esophagram.  Assessment: Encounter Diagnoses  Name Primary?   Chronic idiopathic constipation Yes   Gastroesophageal reflux disease, unspecified whether esophagitis present      Patient presents with longstanding constipation issues, suspected to be related to her quadriplegia and limited mobility.  She has responded well to taking Miralax  and Colcae on a more regular basis.Will continue her current constipation regimen and sucralfate  PRN. She will continue to work with dietitian for weight loss.   Plan: - Cont Miralax /Colace with senna as needed  - Cont sucralfate  PRN , refilled -RTC in 3-4 mths with Dr. Rosaline Coma  Thank you for the courtesy of this consult. Please call me with any questions or concerns.   Dream Nodal, FNP-C [facility] 04/11/2024, 4:41 PM  Cc: Sharma Dears, NP

## 2024-04-11 NOTE — Progress Notes (Signed)
 I agree with the assessment and plan as outlined by Ms. May.

## 2024-04-15 ENCOUNTER — Telehealth: Payer: Self-pay | Admitting: Gastroenterology

## 2024-04-15 NOTE — Telephone Encounter (Signed)
 Patient called and stated that Veronica Huff prescribed her with Carafate  and he pharmacy is requesting a prescriber approval for her insurance. Patient is requesting a call back notifying her that it has been done. Please advise.

## 2024-04-24 ENCOUNTER — Other Ambulatory Visit (HOSPITAL_COMMUNITY): Payer: Self-pay

## 2024-04-25 ENCOUNTER — Other Ambulatory Visit: Payer: Self-pay | Admitting: Gastroenterology

## 2024-04-25 ENCOUNTER — Other Ambulatory Visit (HOSPITAL_COMMUNITY): Payer: Self-pay

## 2024-04-25 ENCOUNTER — Other Ambulatory Visit (HOSPITAL_BASED_OUTPATIENT_CLINIC_OR_DEPARTMENT_OTHER): Payer: Self-pay

## 2024-04-25 MED ORDER — SODIUM CHLORIDE 0.9 % IR SOLN
6 refills | Status: DC
  Filled 2024-04-25: qty 5000, 30d supply, fill #0
  Filled 2024-05-19: qty 5000, 30d supply, fill #1
  Filled 2024-06-13: qty 5000, 16d supply, fill #2
  Filled 2024-06-30: qty 5000, 16d supply, fill #3
  Filled 2024-07-16: qty 5000, 16d supply, fill #4
  Filled 2024-08-01: qty 5000, 16d supply, fill #5
  Filled 2024-08-26: qty 5000, 16d supply, fill #6

## 2024-04-29 ENCOUNTER — Other Ambulatory Visit: Payer: Self-pay

## 2024-04-29 ENCOUNTER — Other Ambulatory Visit (HOSPITAL_COMMUNITY): Payer: Self-pay

## 2024-05-02 ENCOUNTER — Other Ambulatory Visit (HOSPITAL_COMMUNITY): Payer: Self-pay

## 2024-05-05 ENCOUNTER — Other Ambulatory Visit: Payer: Self-pay

## 2024-05-05 ENCOUNTER — Other Ambulatory Visit (HOSPITAL_COMMUNITY): Payer: Self-pay
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
## 2024-05-06 ENCOUNTER — Other Ambulatory Visit (HOSPITAL_COMMUNITY): Payer: Self-pay

## 2024-05-19 ENCOUNTER — Other Ambulatory Visit: Payer: Self-pay

## 2024-05-28 ENCOUNTER — Other Ambulatory Visit (HOSPITAL_COMMUNITY): Payer: Self-pay

## 2024-05-29 ENCOUNTER — Other Ambulatory Visit: Payer: Self-pay

## 2024-05-29 ENCOUNTER — Other Ambulatory Visit (HOSPITAL_COMMUNITY): Payer: Self-pay

## 2024-06-01 NOTE — Progress Notes (Unsigned)
  Cardiology Office Note:  .   Date:  06/01/2024  ID:  Veronica Huff, DOB 18-Oct-1986, MRN [id number] PCP: Veronica Rosaline NOVAK, NP  [facility] Providers Cardiologist:  None Electrophysiologist:  Veronica ONEIDA HOLTS, MD {  History of Present Illness: .   Veronica Huff is a 38 y.o. female w/PMHx of  Stroke ( at age 48) >> qudraplegic, trach (nocturnal vent), neurogenic bladder Depression, anxiety Sinus tachycardia  Last saw EP/Dr. HOLTS via telehealth visit 12/13/22, pt reported heart issues well controlled on the metoprolol  Discussed low BP, felt to be 2/2 dehydration and urged adequate PO intake   Today's visit is scheduled as an annual visit ROS:   She is accompanied by her father and caregiver  Denies CP, palpitations Being on the metoprolol  has very much improved her HR/awareness of her heart beat  She has trach > on vent at HS for apnea > sometimes reports she is hard to wake up Sees her ENT soon for f/u on granulation tissue below her trach and any management that may need to be done  She in the last 2 months of so developed b/l UE and LE swelling was started on lasix with resolution in her UE swelling and marked improvement in her LE swelling She has what sounds long standing dependent rubor/cyanosis feet, though her father today expresses significant concern about it  No near syncope or syncope  Studies Reviewed: SABRA    EKG done today and reviewed by myself:  SR 76bpm  Monitor 2022 HR 75-137, average 100 bpm. Rare supraventricular and ventricular ectopy. No sustained arrhythmias.   Risk Assessment/Calculations:    Physical Exam:   VS:  There were no vitals taken for this visit.   Wt Readings from Last 3 Encounters:  02/01/24 140 lb (63.5 kg)  01/14/24 140 lb (63.5 kg)  08/09/23 130 lb 1.1 oz (59 kg)    GEN: Well nourished, well developed in no acute distress NECK: No JVD; + trach CARDIAC: RRR, no murmurs, rubs, gallops RESPIRATORY:  CTA b/l  without rales, wheezing or rhonchi  ABDOMEN: Soft, non-tender, non-distended EXTREMITIES: b/l feet are cool and with blue toes and rubor, with elevation she has fairly quick recovery/resolution, 3+ DP pulses b/l 2+ PT pulses b/l, had brisk cap refill b/l as well.   B/l ankle edema R>L  ASSESSMENT AND PLAN: .    Sinus tachycardia Awareness of her ehart beat Resolved  on low dose BB  3.  Dependent cyanosis/rubor b/l LE + pedal pulses Improves with elevation of her feet Recommend elevation of her feet intermittently through the day, avoid long period of dangling  4. Edema Initially bl UE and LE Better with lasix Ankle edema remains b/l LE R>L Likely immobility  Will get LE arterial and venous doppler/US  to evaluate above  Echo to evaluate the edema She requests a Rx for compression stockings > will defer to her primary provider   Dispo: she likes annual visits, will see her sooner pending test results  Signed, Veronica Macario Arthur, PA-C

## 2024-06-03 ENCOUNTER — Ambulatory Visit: Attending: Physician Assistant | Admitting: Physician Assistant

## 2024-06-03 ENCOUNTER — Other Ambulatory Visit (HOSPITAL_COMMUNITY): Payer: Self-pay

## 2024-06-03 ENCOUNTER — Encounter: Payer: Self-pay | Admitting: Physician Assistant

## 2024-06-03 DIAGNOSIS — R609 Edema, unspecified: Secondary | ICD-10-CM | POA: Diagnosis not present

## 2024-06-03 DIAGNOSIS — R600 Localized edema: Secondary | ICD-10-CM | POA: Diagnosis not present

## 2024-06-03 DIAGNOSIS — R230 Cyanosis: Secondary | ICD-10-CM | POA: Insufficient documentation

## 2024-06-03 DIAGNOSIS — R002 Palpitations: Secondary | ICD-10-CM | POA: Insufficient documentation

## 2024-06-03 NOTE — Patient Instructions (Signed)
 Medication Instructions:   Your physician recommends that you continue on your current medications as directed. Please refer to the Current Medication list given to you today.   *If you need a refill on your cardiac medications before your next appointment, please call your pharmacy*  Lab Work:  NONE ORDERED  TODAY   If you have labs (blood work) drawn today and your tests are completely normal, you will receive your results only by: MyChart Message (if you have MyChart) OR A paper copy in the mail If you have any lab test that is abnormal or we need to change your treatment, we will call you to review the results.   Testing/Procedures:  Your physician has requested that you have an echocardiogram. Echocardiography is a painless test that uses sound waves to create images of your heart. It provides your doctor with information about the size and shape of your heart and how well your heart's chambers and valves are working. This procedure takes approximately one hour. There are no restrictions for this procedure. Please do NOT wear cologne, perfume, aftershave, or lotions (deodorant is allowed). Please arrive 15 minutes prior to your appointment time.  Please note: We ask at that you not bring children with you during ultrasound (echo/ vascular) testing. Due to room size and safety concerns, children are not allowed in the ultrasound rooms during exams. Our front office staff cannot provide observation of children in our lobby area while testing is being conducted. An adult accompanying a patient to their appointment will only be allowed in the ultrasound room at the discretion of the ultrasound technician under special circumstances. We apologize for any inconvenience.  Your physician has requested that you have an ankle brachial index (ABI). During this test an ultrasound and blood pressure cuff are used to evaluate the arteries that supply the arms and legs with blood. Allow thirty minutes  for this exam. There are no restrictions or special instructions.  Please note: We ask at that you not bring children with you during ultrasound (echo/ vascular) testing. Due to room size and safety concerns, children are not allowed in the ultrasound rooms during exams. Our front office staff cannot provide observation of children in our lobby area while testing is being conducted. An adult accompanying a patient to their appointment will only be allowed in the ultrasound room at the discretion of the ultrasound technician under special circumstances. We apologize for any inconvenience.   Your physician has requested that you have a lower extremity arterial exercise duplex. During this test, exercise and ultrasound are used to evaluate arterial blood flow in the legs. Allow one hour for this exam. There are no restrictions or special instructions.    Follow-Up: At [facility], you and your health needs are our priority.  As part of our continuing mission to provide you with exceptional heart care, our providers are all part of one team.  This team includes your primary Cardiologist (physician) and Advanced Practice Providers or APPs (Physician Assistants and Nurse Practitioners) who all work together to provide you with the care you need, when you need it.  Your next appointment:   1 year(s)  Provider:   You may see OLE ONEIDA HOLTS, MD or one of the following Advanced Practice Providers on your designated Care Team:   Charlies Arthur, [place]    We recommend signing up for the patient portal called MyChart.  Sign up information is provided on this After Visit Summary.  MyChart is used  to connect with patients for Virtual Visits (Telemedicine).  Patients are able to view lab/test results, encounter notes, upcoming appointments, etc.  Non-urgent messages can be sent to your provider as well.   To learn more about what you can do with MyChart, go to [email or web address].   Other  Instructions

## 2024-06-13 ENCOUNTER — Other Ambulatory Visit (HOSPITAL_COMMUNITY): Payer: Self-pay

## 2024-06-13 MED ORDER — ALBUTEROL SULFATE (2.5 MG/3ML) 0.083% IN NEBU
INHALATION_SOLUTION | Freq: Four times a day (QID) | RESPIRATORY_TRACT | 5 refills | Status: AC
  Filled 2024-06-13: qty 180, 15d supply, fill #0
  Filled 2024-07-22: qty 180, 15d supply, fill #1
  Filled 2024-08-26 – 2024-10-23 (×4): qty 180, 15d supply, fill #2
  Filled 2024-12-01: qty 180, 15d supply, fill #3

## 2024-06-14 ENCOUNTER — Other Ambulatory Visit (HOSPITAL_COMMUNITY): Payer: Self-pay

## 2024-06-16 ENCOUNTER — Other Ambulatory Visit (HOSPITAL_COMMUNITY): Payer: Self-pay

## 2024-06-16 ENCOUNTER — Other Ambulatory Visit: Payer: Self-pay

## 2024-06-30 ENCOUNTER — Other Ambulatory Visit (HOSPITAL_COMMUNITY): Payer: Self-pay

## 2024-06-30 ENCOUNTER — Other Ambulatory Visit: Payer: Self-pay

## 2024-07-16 ENCOUNTER — Encounter: Payer: Self-pay | Admitting: Obstetrics & Gynecology

## 2024-07-17 ENCOUNTER — Other Ambulatory Visit (HOSPITAL_COMMUNITY): Payer: Self-pay

## 2024-07-17 ENCOUNTER — Other Ambulatory Visit: Payer: Self-pay

## 2024-07-22 ENCOUNTER — Other Ambulatory Visit (HOSPITAL_COMMUNITY): Payer: Self-pay

## 2024-08-01 ENCOUNTER — Other Ambulatory Visit: Payer: Self-pay

## 2024-08-01 ENCOUNTER — Other Ambulatory Visit (HOSPITAL_COMMUNITY): Payer: Self-pay

## 2024-08-01 ENCOUNTER — Encounter (HOSPITAL_COMMUNITY)

## 2024-08-01 ENCOUNTER — Other Ambulatory Visit (HOSPITAL_COMMUNITY)

## 2024-08-08 ENCOUNTER — Ambulatory Visit (HOSPITAL_COMMUNITY)
Admission: RE | Admit: 2024-08-08 | Discharge: 2024-08-08 | Disposition: A | Discharge status: Home / Self Care | Source: Ambulatory Visit | Attending: Physician Assistant | Admitting: Physician Assistant

## 2024-08-08 ENCOUNTER — Ambulatory Visit (HOSPITAL_BASED_OUTPATIENT_CLINIC_OR_DEPARTMENT_OTHER)
Admission: RE | Admit: 2024-08-08 | Discharge: 2024-08-08 | Disposition: A | Discharge status: Home / Self Care | Source: Ambulatory Visit | Attending: Physician Assistant | Admitting: Physician Assistant

## 2024-08-08 DIAGNOSIS — R609 Edema, unspecified: Secondary | ICD-10-CM

## 2024-08-08 DIAGNOSIS — R600 Localized edema: Secondary | ICD-10-CM

## 2024-08-08 LAB — ECHOCARDIOGRAM COMPLETE
AR max vel: 2.27 cm2
AV Area VTI: 2.04 cm2
AV Area mean vel: 2.24 cm2
AV Mean grad: 2 mmHg
AV Peak grad: 4.1 mmHg
Ao pk vel: 1.01 m/s
Area-P 1/2: 3.72 cm2
S' Lateral: 2.22 cm

## 2024-08-08 LAB — VAS US ABI WITH/WO TBI
Left ABI: 1.21
Right ABI: 1.06

## 2024-08-11 ENCOUNTER — Ambulatory Visit: Payer: Self-pay | Admitting: Physician Assistant

## 2024-08-26 ENCOUNTER — Other Ambulatory Visit (HOSPITAL_COMMUNITY): Payer: Self-pay

## 2024-08-27 ENCOUNTER — Encounter (HOSPITAL_COMMUNITY): Payer: Self-pay

## 2024-08-27 ENCOUNTER — Other Ambulatory Visit (HOSPITAL_COMMUNITY): Payer: Self-pay

## 2024-08-29 ENCOUNTER — Other Ambulatory Visit (HOSPITAL_COMMUNITY): Payer: Self-pay

## 2024-08-29 MED ORDER — TOPIRAMATE 50 MG PO TABS
50.0000 mg | ORAL_TABLET | Freq: Every evening | ORAL | 3 refills | Status: AC
  Filled 2024-08-29: qty 30, 30d supply, fill #0

## 2024-08-29 MED ORDER — CETIRIZINE HCL 10 MG PO TABS
10.0000 mg | ORAL_TABLET | Freq: Every day | ORAL | 5 refills | Status: AC
  Filled 2024-08-29: qty 30, 30d supply, fill #0

## 2024-08-29 MED ORDER — CEPHALEXIN 250 MG PO CAPS
250.0000 mg | ORAL_CAPSULE | Freq: Every day | ORAL | 3 refills | Status: AC
  Filled 2024-08-29: qty 30, 30d supply, fill #0

## 2024-08-29 MED ORDER — GABAPENTIN 300 MG PO CAPS
300.0000 mg | ORAL_CAPSULE | Freq: Three times a day (TID) | ORAL | 3 refills | Status: AC
Start: 2024-08-29 — End: ?
  Filled 2024-08-29: qty 90, 30d supply, fill #0

## 2024-08-29 MED ORDER — HYDROXYZINE HCL 50 MG PO TABS
50.0000 mg | ORAL_TABLET | Freq: Two times a day (BID) | ORAL | 5 refills | Status: AC
  Filled 2024-08-29: qty 60, 30d supply, fill #0

## 2024-08-29 MED ORDER — VITAMIN B-12 1000 MCG PO TABS
1000.0000 ug | ORAL_TABLET | Freq: Every day | ORAL | 5 refills | Status: AC
  Filled 2024-08-29: qty 30, 30d supply, fill #0

## 2024-09-05 ENCOUNTER — Other Ambulatory Visit (HOSPITAL_COMMUNITY): Payer: Self-pay

## 2024-09-05 MED ORDER — ALBUTEROL SULFATE (2.5 MG/3ML) 0.083% IN NEBU
3.0000 mL | INHALATION_SOLUTION | Freq: Four times a day (QID) | RESPIRATORY_TRACT | 5 refills | Status: DC | PRN
  Filled 2024-09-05: qty 180, 15d supply, fill #0

## 2024-09-08 ENCOUNTER — Other Ambulatory Visit (HOSPITAL_COMMUNITY): Payer: Self-pay

## 2024-09-08 ENCOUNTER — Ambulatory Visit: Admitting: Family Medicine

## 2024-09-08 ENCOUNTER — Other Ambulatory Visit: Payer: Self-pay

## 2024-09-08 MED ORDER — SODIUM CHLORIDE 0.9 % IR SOLN
6 refills | Status: AC
  Filled 2024-09-08: qty 5000, 16d supply, fill #0

## 2024-09-09 ENCOUNTER — Other Ambulatory Visit: Payer: Self-pay

## 2024-09-15 ENCOUNTER — Other Ambulatory Visit (HOSPITAL_COMMUNITY): Payer: Self-pay

## 2024-09-16 ENCOUNTER — Other Ambulatory Visit (HOSPITAL_COMMUNITY): Payer: Self-pay

## 2024-09-17 ENCOUNTER — Other Ambulatory Visit (HOSPITAL_COMMUNITY): Payer: Self-pay

## 2024-10-15 ENCOUNTER — Other Ambulatory Visit (HOSPITAL_COMMUNITY)
Admission: RE | Admit: 2024-10-15 | Discharge: 2024-10-15 | Disposition: A | Discharge status: Home / Self Care | Source: Ambulatory Visit | Attending: Pulmonary Disease | Admitting: Pulmonary Disease

## 2024-10-20 ENCOUNTER — Other Ambulatory Visit: Payer: Self-pay

## 2024-10-20 ENCOUNTER — Other Ambulatory Visit (HOSPITAL_COMMUNITY)
Admission: RE | Admit: 2024-10-20 | Discharge: 2024-10-20 | Disposition: A | Source: Ambulatory Visit | Attending: Family Medicine | Admitting: Family Medicine

## 2024-10-20 ENCOUNTER — Encounter: Payer: Self-pay | Admitting: Family Medicine

## 2024-10-20 ENCOUNTER — Ambulatory Visit: Admitting: Family Medicine

## 2024-10-20 VITALS — BP 112/66 | HR 85

## 2024-10-20 DIAGNOSIS — N9089 Other specified noninflammatory disorders of vulva and perineum: Secondary | ICD-10-CM | POA: Insufficient documentation

## 2024-10-20 NOTE — Patient Instructions (Signed)
 Post-procedure instructions:  Pelvic rest for 1 week Call with heavy bleeding, fever greater than 100.4, foul smelling vaginal discharge or other concerns.   Results will be communicated via MyChart

## 2024-10-20 NOTE — Progress Notes (Signed)
   GYNECOLOGY PROBLEM  VISIT ENCOUNTER NOTE  Subjective:   Veronica Huff is a 38 y.o. G0P0000 female here for a problem GYN visit.  Current complaints: skin tag. Seen by her care givers, denies irritation. Denies itching. Reports no bleeding. Care giver just noticed.     Denies abnormal vaginal bleeding, discharge, pelvic pain, problems with intercourse or other gynecologic concerns.    Gynecologic History No LMP recorded. Patient has had an injection.  Contraception: Depo-Provera  injections  Health Maintenance Due  Topic Date Due   Medicare Annual Wellness (AWV)  Never done   Hepatitis C Screening  Never done   Hepatitis B Vaccines 19-59 Average Risk (1 of 3 - 19+ 3-dose series) Never done   HPV VACCINES (1 - Risk 3-dose SCDM series) Never done   Cervical Cancer Screening (Pap smear)  02/21/2024   COVID-19 Vaccine (5 - 2025-26 season) 07/21/2024    The following portions of the patient's history were reviewed and updated as appropriate: allergies, current medications, past family history, past medical history, past social history, past surgical history and problem list.  Review of Systems Pertinent items are noted in HPI.   Objective:  There were no vitals taken for this visit.  Gen: well appearing, NAD HEENT: no scleral icterus CV: RR Lung: Normal WOB Ext: warm well perfused  PELVIC: Full assist for positioning. Needs support for bilateral legs from staff.   Normal appearing external genitalia-healing candidal infection seen on L>R labia majora and upper left thigh; 5mm normally pigmented papule. normal appearing vaginal mucosa and cervix.  No abnormal discharge noted.     Skin/vulvar punch BIOPSY NOTE The indications for vulvar biopsy (rule out neoplasia, establish lichen sclerosus diagnosis) were reviewed.   Risks of the biopsy including pain, bleeding, infection, inadequate specimen, scarring and need for additional procedures  were discussed. The patient stated  understanding and agreed to undergo procedure today. Consent was signed,  time out performed.  Chaperone was present during entire procedure. The patient's vulva was prepped with Betadine. 1 cc of 1% lidocaine  was injected into perineal skin tag. A 4-mm punch biopsy was done, biopsy tissue was picked up with sterile forceps and sterile scissors were used to excise the lesion.  Small bleeding was noted and hemostasis was achieved using silver nitrate sticks.  The patient tolerated the procedure well.   Assessment and Plan:  1. Skin tag of female perineum (Primary) Reviewed likely normal skin tag, does not have features that are overly concerning for HPV  Patient would like HPV vaccination== wants to get when it is warmer Consented for punch bx- reviewed r/b/a of procedure. Discussed bleeding and infection in setting of perineal care.   Post-procedure instructions  (pelvic rest for one week) were given to the patient. The patient is to call with heavy bleeding, fever greater than 100.4, foul smelling vaginal discharge or other concerns. The patient will be return to clinic in two weeks for discussion of results   Please refer to After Visit Summary for other counseling recommendations.    Return in about 2 months (around 12/21/2024) for HPV vaccination, RN only visit.  Suzen Maryan Masters, MD, MPH, ABFM Attending Physician Faculty Practice- [facility]

## 2024-10-22 ENCOUNTER — Encounter: Payer: Self-pay | Admitting: *Deleted

## 2024-10-22 ENCOUNTER — Ambulatory Visit: Payer: Self-pay | Admitting: Family Medicine

## 2024-10-22 LAB — SURGICAL PATHOLOGY

## 2024-10-23 ENCOUNTER — Other Ambulatory Visit (HOSPITAL_COMMUNITY): Payer: Self-pay

## 2024-10-23 ENCOUNTER — Telehealth: Payer: Self-pay | Admitting: Neurology

## 2024-10-23 MED ORDER — BACLOFEN 20 MG PO TABS: 40.0000 mg | ORAL_TABLET | Freq: Four times a day (QID) | ORAL | 5 refills | Status: AC

## 2024-10-23 NOTE — Telephone Encounter (Signed)
 ..  Pt understands that although there may be some limitations with this type of visit, we will take all precautions to reduce any security or privacy concerns.  Pt understands that this will be treated like an in office visit and we will file with pt's insurance, and there may be a patient responsible charge related to this service. ? ?

## 2024-10-23 NOTE — Telephone Encounter (Signed)
 Last seen on 06/28/23 Follow up scheduled on 03/23/25   Per 09/21/2023 refill message   Rx sent.

## 2024-10-23 NOTE — Telephone Encounter (Signed)
 Pt is requesting a refill for baclofen  (LIORESAL ) 20 MG tablet .  Pharmacy: [facility]

## 2024-10-29 ENCOUNTER — Other Ambulatory Visit (HOSPITAL_COMMUNITY): Payer: Self-pay

## 2024-11-06 ENCOUNTER — Other Ambulatory Visit (HOSPITAL_COMMUNITY): Payer: Self-pay

## 2024-11-06 ENCOUNTER — Telehealth: Payer: Self-pay | Admitting: Neurology

## 2024-11-06 MED ORDER — CETIRIZINE HCL 10 MG PO TABS
10.0000 mg | ORAL_TABLET | Freq: Every day | ORAL | 1 refills | Status: AC
  Filled 2024-11-06: qty 90, 90d supply, fill #0

## 2024-11-06 NOTE — Telephone Encounter (Signed)
 MYC conf

## 2024-11-17 ENCOUNTER — Telehealth: Payer: Self-pay | Admitting: Neurology

## 2024-11-17 NOTE — Telephone Encounter (Signed)
 As a result of pt finding out that her insurance will no longer cover her for my chart vv she made her a f/u an in office appointment for same date and time for initial appointment, pt aware to arrive at 10:00a.m.

## 2024-11-19 ENCOUNTER — Other Ambulatory Visit (HOSPITAL_COMMUNITY): Payer: Self-pay

## 2024-12-01 ENCOUNTER — Other Ambulatory Visit: Payer: Self-pay

## 2024-12-01 ENCOUNTER — Other Ambulatory Visit (HOSPITAL_COMMUNITY): Payer: Self-pay

## 2024-12-02 ENCOUNTER — Other Ambulatory Visit: Payer: Self-pay

## 2024-12-02 ENCOUNTER — Other Ambulatory Visit (HOSPITAL_COMMUNITY): Payer: Self-pay

## 2024-12-22 ENCOUNTER — Ambulatory Visit

## 2024-12-26 ENCOUNTER — Telehealth: Payer: Self-pay | Admitting: Neurology

## 2024-12-26 NOTE — Telephone Encounter (Signed)
 Patient call the answering service reporting new complaints of burning sensation in the right foot. Her home nurse checked the area, no redness, no irritation, no signs of infection. I have advised her to try some pain med, I will send a message to Dr. Onita and she should consider ED if her symptoms do get worse.    Dr. Cobin Cadavid

## 2025-03-23 ENCOUNTER — Ambulatory Visit: Admitting: Neurology
